# Patient Record
Sex: Male | Born: 1950 | Race: White | Hispanic: No | Marital: Married | Smoking: Current some day smoker
Health system: Southern US, Community
[De-identification: ages and names within clinical notes are randomized; demographics above are authoritative.]

## PROBLEM LIST (undated history)

## (undated) DIAGNOSIS — D62 Acute posthemorrhagic anemia: Secondary | ICD-10-CM

## (undated) DIAGNOSIS — K219 Gastro-esophageal reflux disease without esophagitis: Secondary | ICD-10-CM

## (undated) DIAGNOSIS — T8189XA Other complications of procedures, not elsewhere classified, initial encounter: Secondary | ICD-10-CM

## (undated) DIAGNOSIS — G4730 Sleep apnea, unspecified: Secondary | ICD-10-CM

## (undated) DIAGNOSIS — F329 Major depressive disorder, single episode, unspecified: Secondary | ICD-10-CM

## (undated) DIAGNOSIS — I10 Essential (primary) hypertension: Secondary | ICD-10-CM

## (undated) DIAGNOSIS — N390 Urinary tract infection, site not specified: Secondary | ICD-10-CM

## (undated) DIAGNOSIS — T1490XA Injury, unspecified, initial encounter: Secondary | ICD-10-CM

## (undated) DIAGNOSIS — K449 Diaphragmatic hernia without obstruction or gangrene: Secondary | ICD-10-CM

## (undated) DIAGNOSIS — B9689 Other specified bacterial agents as the cause of diseases classified elsewhere: Secondary | ICD-10-CM

## (undated) DIAGNOSIS — F3289 Other specified depressive episodes: Secondary | ICD-10-CM

## (undated) DIAGNOSIS — C44310 Basal cell carcinoma of skin of unspecified parts of face: Secondary | ICD-10-CM

## (undated) DIAGNOSIS — K222 Esophageal obstruction: Secondary | ICD-10-CM

## (undated) DIAGNOSIS — IMO0002 Reserved for concepts with insufficient information to code with codable children: Secondary | ICD-10-CM

## (undated) HISTORY — DX: Other complications of procedures, not elsewhere classified, initial encounter: T81.89XA

## (undated) HISTORY — PX: WRIST FRACTURE SURGERY: SHX121

## (undated) HISTORY — DX: Reserved for concepts with insufficient information to code with codable children: IMO0002

## (undated) HISTORY — DX: Diaphragmatic hernia without obstruction or gangrene: K44.9

## (undated) HISTORY — PX: ADENOIDECTOMY: SUR15

## (undated) HISTORY — PX: SPINE SURGERY: SHX786

## (undated) HISTORY — PX: ANKLE FRACTURE SURGERY: SHX122

## (undated) HISTORY — PX: FEMUR FRACTURE SURGERY: SHX633

## (undated) HISTORY — PX: ABDOMINAL SURGERY: SHX537

## (undated) HISTORY — DX: Acute posthemorrhagic anemia: D62

## (undated) HISTORY — PX: PELVIC FRACTURE SURGERY: SHX119

## (undated) HISTORY — PX: JOINT REPLACEMENT: SHX530

## (undated) HISTORY — PX: TONSILLECTOMY: SUR1361

## (undated) HISTORY — DX: Sleep apnea, unspecified: G47.30

## (undated) HISTORY — DX: Other specified bacterial agents as the cause of diseases classified elsewhere: B96.89

## (undated) HISTORY — DX: Gastro-esophageal reflux disease without esophagitis: K21.9

## (undated) HISTORY — DX: Esophageal obstruction: K22.2

## (undated) HISTORY — DX: Urinary tract infection, site not specified: N39.0

## (undated) HISTORY — DX: Other specified depressive episodes: F32.89

## (undated) HISTORY — DX: Major depressive disorder, single episode, unspecified: F32.9

## (undated) HISTORY — DX: Essential (primary) hypertension: I10

## (undated) HISTORY — DX: Basal cell carcinoma of skin of unspecified parts of face: C44.310

---
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
## 1987-02-07 HISTORY — PX: NASAL SEPTUM SURGERY: SHX37

## 1992-11-06 HISTORY — PX: APPENDECTOMY: SHX54

## 1998-05-08 HISTORY — PX: LUMBAR FUSION: SHX111

## 1998-09-13 ENCOUNTER — Encounter: Admission: RE | Admit: 1998-09-13 | Discharge: 1998-12-12 | Payer: Self-pay | Admitting: *Deleted

## 1999-01-07 HISTORY — PX: OTHER SURGICAL HISTORY: SHX169

## 1999-01-21 ENCOUNTER — Encounter: Admission: RE | Admit: 1999-01-21 | Discharge: 1999-04-21 | Payer: Self-pay | Admitting: *Deleted

## 2000-02-07 HISTORY — PX: VENTRAL HERNIA REPAIR: SHX424

## 2000-03-26 ENCOUNTER — Encounter: Payer: Self-pay | Admitting: Surgery

## 2000-03-28 ENCOUNTER — Ambulatory Visit (HOSPITAL_COMMUNITY): Admission: RE | Admit: 2000-03-28 | Discharge: 2000-03-29 | Payer: Self-pay | Admitting: Surgery

## 2002-09-07 HISTORY — PX: OTHER SURGICAL HISTORY: SHX169

## 2002-11-11 ENCOUNTER — Encounter: Admission: RE | Admit: 2002-11-11 | Discharge: 2002-12-23 | Payer: Self-pay | Admitting: *Deleted

## 2003-05-08 LAB — HM COLONOSCOPY

## 2003-05-28 ENCOUNTER — Ambulatory Visit (HOSPITAL_COMMUNITY): Admission: RE | Admit: 2003-05-28 | Discharge: 2003-05-28 | Payer: Self-pay | Admitting: Gastroenterology

## 2010-02-22 LAB — POC HEMOCCULT BLD/STL (HOME/3-CARD/SCREEN)

## 2010-04-30 ENCOUNTER — Encounter: Payer: Self-pay | Admitting: Family Medicine

## 2010-04-30 DIAGNOSIS — F329 Major depressive disorder, single episode, unspecified: Secondary | ICD-10-CM

## 2010-04-30 DIAGNOSIS — K222 Esophageal obstruction: Secondary | ICD-10-CM

## 2010-04-30 DIAGNOSIS — K219 Gastro-esophageal reflux disease without esophagitis: Secondary | ICD-10-CM

## 2010-04-30 DIAGNOSIS — I10 Essential (primary) hypertension: Secondary | ICD-10-CM

## 2010-04-30 DIAGNOSIS — K449 Diaphragmatic hernia without obstruction or gangrene: Secondary | ICD-10-CM

## 2010-04-30 DIAGNOSIS — F339 Major depressive disorder, recurrent, unspecified: Secondary | ICD-10-CM | POA: Insufficient documentation

## 2010-04-30 DIAGNOSIS — G4730 Sleep apnea, unspecified: Secondary | ICD-10-CM

## 2010-04-30 DIAGNOSIS — C44310 Basal cell carcinoma of skin of unspecified parts of face: Secondary | ICD-10-CM | POA: Insufficient documentation

## 2010-04-30 DIAGNOSIS — IMO0002 Reserved for concepts with insufficient information to code with codable children: Secondary | ICD-10-CM

## 2010-07-23 ENCOUNTER — Inpatient Hospital Stay (HOSPITAL_COMMUNITY)
Admission: EM | Admit: 2010-07-23 | Discharge: 2010-07-26 | DRG: 857 | Disposition: A | Discharge status: Home Health | Payer: Managed Care, Other (non HMO) | Source: Ambulatory Visit | Attending: Orthopedic Surgery | Admitting: Orthopedic Surgery

## 2010-07-23 DIAGNOSIS — Y838 Other surgical procedures as the cause of abnormal reaction of the patient, or of later complication, without mention of misadventure at the time of the procedure: Secondary | ICD-10-CM | POA: Diagnosis present

## 2010-07-23 DIAGNOSIS — M1990 Unspecified osteoarthritis, unspecified site: Secondary | ICD-10-CM | POA: Diagnosis present

## 2010-07-23 DIAGNOSIS — Y92009 Unspecified place in unspecified non-institutional (private) residence as the place of occurrence of the external cause: Secondary | ICD-10-CM

## 2010-07-23 DIAGNOSIS — M009 Pyogenic arthritis, unspecified: Secondary | ICD-10-CM | POA: Diagnosis present

## 2010-07-23 DIAGNOSIS — Z96649 Presence of unspecified artificial hip joint: Secondary | ICD-10-CM

## 2010-07-23 DIAGNOSIS — G4733 Obstructive sleep apnea (adult) (pediatric): Secondary | ICD-10-CM | POA: Diagnosis present

## 2010-07-23 DIAGNOSIS — T8140XA Infection following a procedure, unspecified, initial encounter: Principal | ICD-10-CM | POA: Diagnosis present

## 2010-07-23 DIAGNOSIS — I10 Essential (primary) hypertension: Secondary | ICD-10-CM | POA: Diagnosis present

## 2010-07-23 LAB — CBC
HCT: 38.9 % — ABNORMAL LOW (ref 39.0–52.0)
Hemoglobin: 13.6 g/dL (ref 13.0–17.0)
MCH: 30.9 pg (ref 26.0–34.0)
MCHC: 35 g/dL (ref 30.0–36.0)

## 2010-07-23 LAB — BASIC METABOLIC PANEL
BUN: 11 mg/dL (ref 6–23)
Calcium: 8.7 mg/dL (ref 8.4–10.5)
GFR calc non Af Amer: >60 mL/min (ref >60)
Glucose, Bld: 136 mg/dL — ABNORMAL HIGH (ref 70–99)
Sodium: 137 mEq/L (ref 135–145)

## 2010-07-23 LAB — SYNOVIAL CELL COUNT + DIFF, W/ CRYSTALS
Lymphocytes-Synovial Fld: 4 % (ref 0–20)
Monocyte-Macrophage-Synovial Fluid: 12 % — ABNORMAL LOW (ref 50–90)
Neutrophil, Synovial: 84 % — ABNORMAL HIGH (ref 0–25)

## 2010-07-24 DIAGNOSIS — M009 Pyogenic arthritis, unspecified: Secondary | ICD-10-CM

## 2010-07-24 LAB — CBC
MCH: 30.4 pg (ref 26.0–34.0)
MCV: 89.2 fL (ref 78.0–100.0)
Platelets: 186 10*3/uL (ref 150–400)
RBC: 4.08 MIL/uL — ABNORMAL LOW (ref 4.22–5.81)
RDW: 12.4 % (ref 11.5–15.5)
WBC: 7.4 10*3/uL (ref 4.0–10.5)

## 2010-07-24 LAB — BASIC METABOLIC PANEL
Calcium: 8.2 mg/dL — ABNORMAL LOW (ref 8.4–10.5)
Chloride: 102 mEq/L (ref 96–112)
Creatinine, Ser: 0.48 mg/dL — ABNORMAL LOW (ref 0.50–1.35)
GFR calc Af Amer: >60 mL/min (ref >60)
Sodium: 138 mEq/L (ref 135–145)

## 2010-07-24 LAB — SEDIMENTATION RATE: Sed Rate: 36 mm/hr — ABNORMAL HIGH (ref 0–16)

## 2010-07-25 DIAGNOSIS — M009 Pyogenic arthritis, unspecified: Secondary | ICD-10-CM

## 2010-07-25 LAB — HIV ANTIBODY (ROUTINE TESTING W REFLEX): HIV: NONREACTIVE

## 2010-07-29 LAB — CULTURE, BLOOD (ROUTINE X 2): Culture  Setup Time: 201206161705

## 2010-08-03 ENCOUNTER — Other Ambulatory Visit: Payer: Self-pay | Admitting: Orthopedic Surgery

## 2010-08-03 DIAGNOSIS — M25511 Pain in right shoulder: Secondary | ICD-10-CM

## 2010-08-04 ENCOUNTER — Ambulatory Visit
Admission: RE | Admit: 2010-08-04 | Discharge: 2010-08-04 | Disposition: A | Discharge status: Home / Self Care | Payer: Managed Care, Other (non HMO) | Source: Ambulatory Visit | Attending: Orthopedic Surgery | Admitting: Orthopedic Surgery

## 2010-08-04 ENCOUNTER — Inpatient Hospital Stay (HOSPITAL_COMMUNITY)
Admission: RE | Admit: 2010-08-04 | Discharge: 2010-08-08 | DRG: 857 | Disposition: A | Discharge status: Home / Self Care | Payer: Managed Care, Other (non HMO) | Source: Ambulatory Visit | Attending: Orthopedic Surgery | Admitting: Orthopedic Surgery

## 2010-08-04 DIAGNOSIS — Y838 Other surgical procedures as the cause of abnormal reaction of the patient, or of later complication, without mention of misadventure at the time of the procedure: Secondary | ICD-10-CM | POA: Diagnosis present

## 2010-08-04 DIAGNOSIS — G4733 Obstructive sleep apnea (adult) (pediatric): Secondary | ICD-10-CM | POA: Diagnosis present

## 2010-08-04 DIAGNOSIS — M009 Pyogenic arthritis, unspecified: Secondary | ICD-10-CM | POA: Diagnosis present

## 2010-08-04 DIAGNOSIS — R7309 Other abnormal glucose: Secondary | ICD-10-CM | POA: Diagnosis present

## 2010-08-04 DIAGNOSIS — M25511 Pain in right shoulder: Secondary | ICD-10-CM

## 2010-08-04 DIAGNOSIS — E785 Hyperlipidemia, unspecified: Secondary | ICD-10-CM | POA: Diagnosis present

## 2010-08-04 DIAGNOSIS — I10 Essential (primary) hypertension: Secondary | ICD-10-CM | POA: Diagnosis present

## 2010-08-04 DIAGNOSIS — T8140XA Infection following a procedure, unspecified, initial encounter: Principal | ICD-10-CM | POA: Diagnosis present

## 2010-08-04 LAB — GRAM STAIN

## 2010-08-04 LAB — BASIC METABOLIC PANEL
BUN: 13 mg/dL (ref 6–23)
GFR calc Af Amer: >60 mL/min (ref >60)
GFR calc non Af Amer: >60 mL/min (ref >60)
Potassium: 4.5 mEq/L (ref 3.5–5.1)
Sodium: 139 mEq/L (ref 135–145)

## 2010-08-04 LAB — CBC
MCHC: 35.1 g/dL (ref 30.0–36.0)
Platelets: 248 10*3/uL (ref 150–400)
RDW: 11.9 % (ref 11.5–15.5)

## 2010-08-05 DIAGNOSIS — M009 Pyogenic arthritis, unspecified: Secondary | ICD-10-CM

## 2010-08-05 LAB — GLUCOSE, CAPILLARY
Glucose-Capillary: 182 mg/dL — ABNORMAL HIGH (ref 70–99)
Glucose-Capillary: 137 mg/dL — ABNORMAL HIGH (ref 70–99)
Glucose-Capillary: 192 mg/dL — ABNORMAL HIGH (ref 70–99)

## 2010-08-05 LAB — BASIC METABOLIC PANEL
Calcium: 8.1 mg/dL — ABNORMAL LOW (ref 8.4–10.5)
Chloride: 103 mEq/L (ref 96–112)
Creatinine, Ser: 0.6 mg/dL (ref 0.50–1.35)
GFR calc Af Amer: >60 mL/min (ref >60)

## 2010-08-05 LAB — CBC
MCH: 29.9 pg (ref 26.0–34.0)
MCV: 88.2 fL (ref 78.0–100.0)
Platelets: 246 10*3/uL (ref 150–400)
RDW: 12.2 % (ref 11.5–15.5)
WBC: 9.7 10*3/uL (ref 4.0–10.5)

## 2010-08-06 LAB — BASIC METABOLIC PANEL
CO2: 31 mEq/L (ref 19–32)
Chloride: 99 mEq/L (ref 96–112)
Creatinine, Ser: 0.53 mg/dL (ref 0.50–1.35)

## 2010-08-06 LAB — CBC
Hemoglobin: 12.3 g/dL — ABNORMAL LOW (ref 13.0–17.0)
MCV: 87.6 fL (ref 78.0–100.0)
Platelets: 247 10*3/uL (ref 150–400)
RBC: 4.19 MIL/uL — ABNORMAL LOW (ref 4.22–5.81)
WBC: 8.2 10*3/uL (ref 4.0–10.5)

## 2010-08-06 LAB — GLUCOSE, CAPILLARY
Glucose-Capillary: 116 mg/dL — ABNORMAL HIGH (ref 70–99)
Glucose-Capillary: 154 mg/dL — ABNORMAL HIGH (ref 70–99)
Glucose-Capillary: 145 mg/dL — ABNORMAL HIGH (ref 70–99)

## 2010-08-06 LAB — BODY FLUID CULTURE: Culture: NO GROWTH

## 2010-08-06 LAB — ANAEROBIC CULTURE

## 2010-08-07 LAB — GLUCOSE, CAPILLARY: Glucose-Capillary: 135 mg/dL — ABNORMAL HIGH (ref 70–99)

## 2010-08-08 LAB — BODY FLUID CULTURE

## 2010-08-08 LAB — GLUCOSE, CAPILLARY: Glucose-Capillary: 130 mg/dL — ABNORMAL HIGH (ref 70–99)

## 2010-08-09 ENCOUNTER — Telehealth: Payer: Self-pay | Admitting: Licensed Clinical Social Worker

## 2010-08-09 ENCOUNTER — Ambulatory Visit (HOSPITAL_COMMUNITY)
Admission: RE | Admit: 2010-08-09 | Payer: Managed Care, Other (non HMO) | Source: Ambulatory Visit | Admitting: Orthopedic Surgery

## 2010-08-09 LAB — ANAEROBIC CULTURE

## 2010-08-09 NOTE — Telephone Encounter (Signed)
Patient is on primaxin every 6 hours and states it interferes with work. Patient wants to take every 8 hours is that ok? Spoke with [facility] pharmacy the pharmacy states it is usually every 8

## 2010-08-09 NOTE — Telephone Encounter (Signed)
Patient was switched to every 8 hours per Dr. Luciana Axe and Dr. Rubye Oaks this as well. Wendall Mola CMA

## 2010-08-18 NOTE — Consult Note (Signed)
NAME:  Melvin [name], Melvin [name] NO.:  [id number]  MEDICAL RECORD NO.:  [id number]  LOCATION:  5010                         FACILITY:  [facility]  PHYSICIAN:  Brendia Sacks, MD    DATE OF BIRTH:  11/03/1950  DATE OF CONSULTATION:  08/05/2010 DATE OF DISCHARGE:                                CONSULTATION   PRIMARY CARE PHYSICIAN:  Ernestina Penna, MD., [facility].  REFERRING PHYSICIAN:  Dr. Jones Broom.  REASON FOR CONSULTATION:  Hyperglycemia.  HISTORY OF PRESENT ILLNESS:  This is a 60 year old man who was admitted by Dr. Ave Filter, yesterday for postoperative shoulder infection for surgery.  He has had hyperglycemia and I have been asked to evaluate him.  The patient reports no previous history of diabetes but however, approximately 5-6 years ago he had elevated blood sugars.  His primary care physician had him follows blood sugars for a period of approximately a month or so and his blood sugars resolved to normal. The patient did lose a significant amount of weight i the past. However, lately he has gained weight and he recently just had checkup approximately 3 weeks ago with his primary care physician.  At that time, his hemoglobin A1c was 6.5.  He was scheduled to see a dietitian.  He notes his hypertension has been well controlled and his hyperlipidemia has been well controlled.  He has obstructive sleep apnea for which he uses CPAP.  REVIEW OF SYSTEMS:  Negative for fever, changes to his vision, sore throat, or rash.  Positive for shoulder pain.  Negative chest pain, shortness breath, nausea, vomiting, pain, diarrhea, dysuria, or bleeding.  PAST MEDICAL HISTORY: 1. Hypertension. 2. Acromioclavicular osteomyelitis. 3. Obstructive sleep apnea. 4. Hyperlipidemia. 5. Osteoarthritis.  PAST SURGICAL HISTORY: 1. Back surgery. 2. Ventral hernia repair. 3. Bilateral hip replacement. 4. Shoulder surgery.  FAMILY HISTORY:  Negative  for first-degree coronary artery disease and diabetes mellitus.  SOCIAL HISTORY:  Nonsmoker, occasional alcohol.  MAINTENANCE MEDICATIONS: 1. Zetia 10 mg p.o. daily. 2. Vitamin D3 p.o. daily. 3. Vitamin C p.o. daily. 4. Currently on ceftriaxone 1 g b.i.d. 5. Vancomycin 1750 mg b.i.d. IV. 6. Norvasc 5 mg p.o. daily. 7. Multivitamin p.o. daily. 8. Lovaza 2 capsules p.o. daily, 1 g each. 9. Ibuprofen 800 mg t.i.d. as needed for pain. 10.Fluoxetine 20 mg 2 capsules p.o. daily. 11.Flonase once for each nostril daily. 12.Finasteride 1 mg p.o. daily.  INPATIENT MEDICATIONS:  Norvasc, Rocephin, Zetia, Prozac, Flonase, sliding scale insulin, multivitamin, and vancomycin.  PHYSICAL EXAMINATION:  GENERAL:  A well-developed, well-nourished man in no acute distress.  He is sitting in his chair. VITAL SIGNS:  Temperature is 99.2, pulse 78, respirations 18, blood pressure 163/72, and sat 98% on CPAP early this morning. HEENT:  Head appears to be normal.  EYES:  Sclerae clear.  Pupils equal, round, react to light and irises appear unremarkable.  ENT:  Dentition fair.  Lips and tongue appear unremarkable. NECK:  Supple.  No lymphadenopathy or masses.  No thyromegaly. CHEST:  Clear to auscultation bilaterally.  No wheezes, rales, or rhonchi.  There is normal respiratory effort.  No lower extremity edema. CARDIOVASCULAR:  Regular rate  and rhythm.  No murmur, rub, or gallop. No lower extremity edema. ABDOMEN:  Soft, nontender, nondistended.  Speech is fluent and clear.  PERTINENT LABORATORY STUDIES:  Capillary blood sugars have ranged from the 130-197.  Fasting blood sugar this morning was 182.  BUN and creatinine well within normal limits.  CBC revealed a stable hemoglobin 11.7.  IMPRESSION AND RECOMMENDATIONS:  This is a 60 year old man with hyperglycemia. 1. Hyperglycemia.  Borderline diabetes mellitus.  His last hemoglobin     A1c was 6.5 approximately 3 weeks ago.  I suspect his blood  sugars     have been elevated by current infection.  However, he has also had     weight gain and his diet has been suboptimal.  He follows quite     closely with Dr. Rudi Heap in place to follow up with him seen.     He already has been referred to see a dietitian by Dr. Christell Constant.  The     patient is willing to check his blood sugars again and would like     to get a glucometer.  Dr. Christell Constant which I think is reasonable.  He     has required moderate amount insulin here and I think he would be     stable for discharge when he is ready for an orthopedic standpoint.     Metformin could be considered.  However, given the fact that the     patient is on vancomycin at this point I would recommend low-dose     glyburide 1.25 mg p.o. daily.  This should be taken in the morning     and the patient should follow his blood sugars closely.  I would     recommend starting with a low dose in this patient who have blood     sugars less than 200, although it does appear that his fasting     blood sugars range from 130s to 180s.  His fasting blood sugar this     morning was 168-182.  Risk of hypoglycemia would be low on the low     dose given his blood sugar elevations.  Ultimately, the patient     loses weight and modified his diet.  He may in time be able to come     off oral agents altogether.  I attempted to reach Dr. Christell Constant, today     but he is out of the office.  I have discussed my recommendations     with Dr. Ave Filter by phone.  I think at this point the patient     will be discharged when stable for an orthopedic standpoint. 2. Hypertension appears to be stable.  Continue Norvasc. 3. Hyperlipidemia.  Continue the patient's Lovaza on discharge.     Brendia Sacks, MD     DG/MEDQ  D:  08/05/2010  T:  08/05/2010  Job:  [id number]  cc:   Ernestina Penna, M.D.  Electronically Signed by Brendia Sacks  on 08/18/2010 11:30:17 AM

## 2010-08-22 LAB — FUNGUS CULTURE W SMEAR

## 2010-08-26 ENCOUNTER — Encounter: Payer: Self-pay | Admitting: Infectious Diseases

## 2010-08-30 NOTE — Discharge Summary (Signed)
  [name], [name] NO.:  [id number]  MEDICAL RECORD NO.:  [id number]  LOCATION:  5011                         FACILITY:  [facility]  PHYSICIAN:  Jones Broom, MD    DATE OF BIRTH:  [birth year]  DATE OF ADMISSION:  07/23/2010 DATE OF DISCHARGE:  07/26/2010                              DISCHARGE SUMMARY   FINAL DIAGNOSIS:  Postoperative infection, right acromioclavicular joint.  PRINCIPAL PROCEDURE:  Irrigation and debridement, right AC joint, July 23, 2010  CONSULTING PHYSICIAN:  Dr. Johny Sax with Infectious Disease.  HOSPITAL COURSE:  Ed was admitted July 23, 2010 after undergoing irrigation and debridement of his right [facility] joint.  He had a postoperative infection after open distal clavicle excision.  He also had blood cultures on the day of his admission.  All the operative cultures and blood cultures remained without growth at the time of his discharge.  He remained afebrile with stable vital signs throughout his hospital course.  Pain was well-controlled postoperatively initially with IV medications and subsequently with oral medications.  He had 2 large bore drains placed at time of surgery and they were followed daily.  On postoperative day #3, drainage had tapered off to minimal to no drainage and they were pulled.  His dressing was changed and the incision site was clean, dry, and intact with no reaccumulation of fluid.  He was eating and drinking well.  He was voiding spontaneously. Dr. Daiva Eves and Dr. Ninetta Lights with Infectious Disease consulted on the patient and made recommendations for empiric treatment based on the fact that cultures have been negative to date.  They recommended a 6-week course of IV vancomycin and ceftriaxone.  The patient did receive a PICC line July 25, 2010.  On the day of discharge, he was stable with stable vital signs.  Pain was well controlled.  He was requiring minimal pain medication.  DISCHARGE INSTRUCTIONS:   He will be discharged home with his wife.  He will need daily dressing changes.  He will have Norco tablets for pain control.  He will have home health IV antibiotics with his PICC line. His outpatient labs to monitor the antibiotics will be followed by Dr. Johny Sax in his office.  He will follow up with me in 5-7 days for wound recheck or sooner with problems or concerns.     Jones Broom, MD     JC/MEDQ  D:  07/26/2010  T:  07/26/2010  Job:  [id number]  Electronically Signed by Jones Broom  on 08/30/2010 03:56:00 PM

## 2010-08-30 NOTE — Op Note (Signed)
Melvin [name], Melvin [name] NO.:  [id number]  MEDICAL RECORD NO.:  [id number]  LOCATION:  5011                         FACILITY:  [facility]  PHYSICIAN:  Jones Broom, MD    DATE OF BIRTH:  [birth year]  DATE OF PROCEDURE:  07/23/2010 DATE OF DISCHARGE:                              OPERATIVE REPORT   PREOPERATIVE DIAGNOSIS:  Right shoulder presumed postoperative infection.  POSTOPERATIVE DIAGNOSIS:  Right shoulder infection involving the acromioclavicular joint and extending into the subacromial space.  PROCEDURE PERFORMED:  Irrigation and debridement right shoulder.  ATTENDING SURGEON:  Jones Broom, MD  ASSISTANT:  None.  ANESTHESIA:  GETA.  COMPLICATIONS:  None.  DRAINS:  Two large JP drains were placed with one in the subacromial space and one in the [facility] joint.  ESTIMATED BLOOD LOSS:  Minimal.  INDICATIONS FOR SURGERY:  The patient is a 60 year old gentleman who had an arthroscopic shoulder surgery approximately 2 months ago.  Given the extent of his [facility] joint disease superiorly, he had an open distal clavicle excision.  He did very well postoperatively and was back to all his normal activities.  Approximately 1 month after the surgery, he had a sudden onset of large swelling in the [facility] joint and subcutaneous tissues.  He is not having fevers, chills or signs of infection and therefore the area was aspirated.  The fluid was sent for culture, Gram stain and cell count and was not felt to be initially consistent with infection.  He had a pressure dressing and the fluid subsequently recurred.  After multiple aspirations with recurrence, all of his aspirates did not grow any bacteria but the suspicion for infection at this point was high enough that I recommended open debridement and antibiotics.  Within the last week, he has started to feel systemically more ill but did not have fevers or chills.  His white blood cell count was 8.  OPERATIVE FINDINGS:   There was significant fluid with gross purulence in the [facility] joint and subcutaneous space.  This did extend into the subacromial and subdeltoid space.  I copiously irrigated this and debrided all unhealthy and nonviable appearing tissue aggressively. Normal saline 6 L were used with pulse lavage and two large JP drains were placed one in subacromial space and one in [facility] joint.  PROCEDURE:  The patient was identified in the preoperative holding area where I personally marked the operative site after verifying site, side and procedure with the patient.  He was taken back to the operating room where general anesthesia was induced without complication.  The right upper extremity was prepped and draped in standard sterile fashion.  The patient was in a modified beach-chair position with the back slightly elevated.  The appropriate time-out procedure was carried out. Antibiotics were held until cultures were taken.  Wound was opened and copious amounts of cloudy yellow fluid with purulence was drained.  This was all captured and sent for microbiology analysis.  Specific instructions were sent to hold the cultures for 14 days specifically to look for P. acnes.  I then extended the incision anterior and posterior and extensively aggressively debrided the area of any non healthy- appearing tissue.  I took a layer of subcutaneous tissue around the area of the incision and used a rongeur to debride all of phlegmon material. The AC joint capsule was opened and extensively involved.  Most of it was resected.  I set my finger under the acromion and subacromial space and some yellow fluid came forth.  I was able to run my finger all the way around in the subacromial space and completely decompress this area. I then used pulse lavage to copiously irrigate the [facility] joint in a subacromial space with 6 liters normal saline.  At the conclusion, I felt there was no further pockets remaining of collection and the  wound did appear fairly clean.  I then placed two large JP drains out anteriorly subcutaneously placing one in the subacromial space and one in the [facility] joint space.  I then sow these in place with 2-0 nylon and closed the wound with 2-0 nylon in an interrupted simple fashion. Sterile dressing was then applied including Adaptic, 4x4s, ABDs and tape.  The patient was then allowed to awaken from general anesthesia, transferred to stretcher and taken to the recovery room in stable condition.  POSTOPERATIVE PLAN:  I will consult Infectious Disease Team for recommendations regarding antibiotic treatment.  For now, I will start him on vancomycin to cover the most common and presumed pathogen.  He will be a sling for comfort.  Drains will remain in for several days.     Jones Broom, MD     JC/MEDQ  D:  07/23/2010  T:  07/24/2010  Job:  [id number]  Electronically Signed by Jones Broom  on 08/30/2010 03:55:56 PM

## 2010-08-30 NOTE — Discharge Summary (Signed)
  [name], [name] NO.:  [id number]  MEDICAL RECORD NO.:  [id number]  LOCATION:  5010                         FACILITY:  [facility]  PHYSICIAN:  Melvin Broom, MD    DATE OF BIRTH:  1950/08/17  DATE OF ADMISSION:  08/04/2010 DATE OF DISCHARGE:  08/08/2010                              DISCHARGE SUMMARY   FINAL DIAGNOSES: 1. Hyperglycemia. 2. Hypertension. 3. Postoperative infection, right shoulder.  PRINCIPAL PROCEDURE:  Irrigation and debridement, arthroscopic and open right shoulder infection, August 04, 2010  CONSULTING PHYSICIAN:  Melvin Sacks, MD  HOSPITAL COURSE:  Melvin [name] is a 60 year old gentleman who was admitted for postoperative shoulder infection with recurrent swelling after previous I and D.  He was taken back to the operating room on August 04, 2010 where he had open and arthroscopic irrigation and debridement of the shoulder.  Multiple cultures were sent again.  He had 3 drains placed.  Postoperatively, he recovered well on the floor.  The drains were kept in for several days to rule out continued drainage.  The Infectious Disease Consult Team revisited the patient and recommended changing his ceftriaxone to Primaxin and continuing the vancomycin. This was done during his hospital stay.  He remains stable with stable vital signs and afebrile throughout his hospital course.  All of these cultures remained without growth.  He was noted to have elevated blood glucose and elevated blood pressures throughout the hospital stay and therefore the Hospitalist Team saw and evaluated him.  They changed his medications and at the time of discharge both were under good control. On August 08, 2010, his drains were all discontinued after they were no longer draining anything and the incision was clean, dry, and intact. He was discharged home in stable condition with his family.  DISCHARGE INSTRUCTIONS:  He will do daily dressing changes until the drain  sites are dry.  He will take his antibiotics as directed with vancomycin and Primaxin.  He will have weekly labs.  He will follow up with me on Friday for wound check.  He will call or return sooner with any problems or concerns.  MEDICATIONS:  His new discharge medications in addition to his home medications include: 1. Glyburide 1.25 mg daily with a meal. 2. Lisinopril 10 mg by mouth daily. 3. Primaxin 500 mg every 6 hours. 4. Vancomycin 2 grams twice daily.     Melvin Broom, MD     JC/MEDQ  D:  08/08/2010  T:  08/08/2010  Job:  [id number]  Electronically Signed by Melvin [name]  on 08/30/2010 03:56:03 PM

## 2010-08-30 NOTE — Op Note (Signed)
NAMESAIFAN, RAYFORD NO.:  [id number]  MEDICAL RECORD NO.:  [id number]  LOCATION:  5010                         FACILITY:  [facility]  PHYSICIAN:  Jones Broom, MD    DATE OF BIRTH:  1951/01/02  DATE OF PROCEDURE:  08/04/2010 DATE OF DISCHARGE:                              OPERATIVE REPORT   PREOPERATIVE DIAGNOSIS:  Right shoulder postoperative infection.  POSTOPERATIVE DIAGNOSIS:  Right shoulder postoperative infection involving the acromioclavicular joint, subdeltoid/subacromial space, and glenohumeral joint.  PROCEDURE PERFORMED:  Arthroscopic and open irrigation and debridement of right shoulder including acromioclavicular joint, glenohumeral joint, and subdeltoid/subacromial space.  ATTENDING SURGEON:  Jones Broom, MD  ASSISTANT:  None.  ANESTHESIA:  GETA.  COMPLICATIONS:  None.  DRAINS:  Three large Hemovac drains were placed with one in the posterior subdeltoid space, one in the [facility] joint, and one in the glenohumeral joint.  ESTIMATED BLOOD LOSS:  Minimal.  INDICATIONS FOR SURGERY:  The patient is a 60 year old gentleman who had an arthroscopic surgery with open distal clavicle excision and initially did very well.  He subsequently had concern for infection at the [facility] joint.  I took him to the operating room 12 days ago where I performed open irrigation and debridement of the [facility] joint which looked concerning for infection, but however, no cultures have ever grown anything from that procedure or 2 prior aspirations.  He has been on vancomycin and ceftriaxone since that procedure, but he has had recurrence of swelling, erythema, and pain in the shoulder.  MRI revealed involvement of the subdeltoid space extending posteriorly along the posterior aspect of the infraspinatus and down nearly the deltoid insertion.  The [facility] joint was involved and I was concerned for involvement of the glenohumeral joint as well.  He is indicated for repeat I and D  with arthroscopic exam of all the affected areas and possible open treatment as well.  He understood risks, benefits, and alternatives to surgery and wished to go ahead with it.  OPERATIVE FINDINGS:  Diagnostic arthroscopy revealed some cloudy yellow fluid in the joint.  There was small fibrinous material in the joint, but the joint surfaces appeared intact without any chondral damage. There was a small defect in the anterior cartilage of the humeral head, which was debrided, but other than that cartilages were very healthy. The rotator cuff was intact.  The biceps tendon was intact.  No large loose bodies were noted.  Four liters of normal saline were run through the glenohumeral joint.  The subacromial space was noted to have fluid, but no gross purulence or fibrinous flexion.  Arthroscopically, I was able to get back to the area of concern adjacent to the infraspinatus and debrided some fibrinous material.  I was also able to get down to the lateral subdeltoid recess and debride this.  The [facility] joint was extensively debrided from the undersurface arthroscopically.  I placed drains in the [facility] joint and the posterior subdeltoid recess and the glenohumeral joint.  At the conclusion of the procedure arthroscopically, I felt that everything had been adequately addressed,but as a precaution I did open the superior incision again for about 3 cm to make sure  there were no cutaneous pockets that would have been missed arthroscopically.  This looked clean.  PROCEDURE:  The patient was identified in the preoperative holding area where I personally marked the operative site after verifying site, side, and procedure with the patient.  He was taken back to the operating room where general anesthesia was induced without complication.  He was seated in the beach-chair position.  All extremities were carefully padded in position.  Antibiotics were held.  The appropriate time-out procedure was carried  out and the right upper extremity was then prepped and draped in a standard sterile fashion.  Standard posterior portal was established and the arthroscope was introduced into the joint.  Prior to introducing the arthroscope, I did collect fluid from the joint, which was cloudy yellow in appearance and was sent for culture, Gram stain. An anterior portal was established and a shaver was introduced to the anterior portal.  There was noted to be some fibrinous material floating in the joint.  This was debrided.  All fibrinous and synovial hypertrophy was debrided.  The joint surfaces were carefully examined and probed.  There was no significant softening or damage to the articular cartilage that could be appreciated.  The rotator cuff was intact with mild undersurface fraying which was debrided.  Collene Mares was run in the joint extensively to run fluid through and agitate the tissues.  A total of about 4 liters normal saline was run through the glenohumeral joint.  The arthroscope was then introduced into the subacromial space.  Lateral portal was established with needle localization.  Collene Mares was used in the subacromial space.  There was not noted to be significant adhesions or fibrinous material.  There was some fluid, but no gross purulence was noted in the subacromial space.  The undersurface of the acromion was examined and debrided of fibrous scar tissue.  The arthroscope was used to carefully examine the posterior aspect of the rotator cuff and then followed over medially to get into the area of the infraspinatus muscle belly, which was carefully examined as this was an area of concern on MRI.  I got to the scapular spine and then progressed inferiorly in the subdeltoid space.  There was small amount of fibrinous material here which was debrided with a shaver.  I spent a lot of time back in this area running the shaver and running fluid through this area to make sure that this was  completely cleaned out.  I then looked in the lateral subdeltoid space where I followed the lateral humerus down all the way to what was felt to be near the deltoid insertion.  I ran the shaver in this area to ensure significant fluid flow and debride any fibrinous-appearing material in this region.  Care was taken not to get into the deltoid fibers.  Attention was then turned to the [facility] joint.  I used the shaver from the lateral portal and then moved the anterior portal into the [facility] joint and ran the shaver air to debride all fibrinous and unhealthy-appearing tissue in the region of the previous [facility] resection.  I identified both the medial aspect of the acromion and the lateral aspect of the distal clavicle and cleaned these with shaver and ArthroCare.  I was able to press down on the superior aspect of the incision to ensure that I was in the correct area and that I was pushing all material that needed to be debrided into the area where the shaver could get it.  I felt very confident that I was in the subcutaneous tissues in this region.  I spent a long time cleaning out this area.  I then took two large Hemovac drains and using a tonsil advanced them into the subacromial space through lateral portal.  The one was directed posteriorly into the subdeltoid space and the second was grasped through the anterior portal with a grasper bringing it into the region of the [facility] joint.  These were sewn into place.  The arthroscope was then reintroduced into the glenohumeral joint and a third large Hemovac was advanced into the anterior portal and the glenohumeral joint under direct visualization.  This was also sewn into place.  Portals were closed with 3-0 nylon.  At the conclusion, I felt very confident that I had adequately debrided the region of the [facility] joint, but to ensure that there was no subcutaneous pockets in this area, I did reopen the previous superior incision and used a rongeur around the  subcutaneous fat to make sure that this was healthy and benign and it was.  This was copiously irrigated with bulb syringe and then closed using nylon in an interrupted fashion.  Sterile dressings were applied including Adaptic, 4 x 4's, ABD, and tape.  The patient was allowed to awaken from general anesthesia, transferred to stretcher, and taken to the recovery room in stable condition.  POSTOPERATIVE PLAN:  He will be admitted to the hospital and continued on his current regimen of vancomycin and ceftriaxone.  Infectious Disease folks will come and see him tomorrow and make a decision regarding any antibiotic change given his continued infection.  Keep the drain drains in for least 48 hours.  He will remain in a sling while the drains are in.     Jones Broom, MD     JC/MEDQ  D:  08/04/2010  T:  08/05/2010  Job:  [id number]  Electronically Signed by Jones Broom  on 08/30/2010 03:56:07 PM

## 2010-08-31 NOTE — Consult Note (Signed)
NAMEROLLY, MAGRI NO.:  [id number]  MEDICAL RECORD NO.:  [id number]  LOCATION:  5011                         FACILITY:  [facility]  PHYSICIAN:  Melvin Lav, MD  DATE OF BIRTH:  1950-09-23  DATE OF CONSULTATION:  07/24/2010 DATE OF DISCHARGE:                                CONSULTATION   REQUESTING PHYSICIAN:  Jones Broom, MD  REASON FOR INFECTIOUS DISEASE CONSULTATION:  Septic right acromioclavicular joint.  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  Since then he has had episodes of swelling at the operative site, this was a largely a subcutaneous growth.  This was aspirated in the office and cell count was not consistent with infection.  Cultures never yielded an organism and the patient has never treated with antibiotics.  The swelling would go down and reaccumulate.  This continued and then within the last 10 days he suddenly developed more swollen and he developed increase in pain in his forearm and shoulder. In addition to that he describes which he says are "flu-like symptoms with body aches and fevers and chills."  He therefore brought to the operating room yesterday by Dr. Ave Filter who performed irrigation and debridement of the right shoulder.  In the operating room, Dr. Ave Filter encountered gross purulence which was drained, these specimens were sent off for bacterial culture, aerobic, anaerobic, fungal, and AFB culture. Two Jackson-Pratt drains were left in place and Dr. Ave Filter also debrided the clavicle as well acromion slightly as well.  We have been asked to  assist in the management and workup for this patient with septic arthritis of the acromioclavicular joint.  The patient briefly asked me questions about swimming and whether it could introduce pathogens to swimming.  He specifically denies use of hot tubs, but has been swimming quite a bit since he got out of surgery. His travel history is not significant.  PAST MEDICAL HISTORY: 1. Osteoarthritis. 2. Hypertension. 3. Obstructive sleep apnea.  PAST SURGICAL HISTORY:  Resection of acromioclavicular joint, bilateral hip replacement, lumbar surgery with placement of cage, ventral hernias, incisional hernia repair.  FAMILY HISTORY:  Noncontributory.  SOCIAL HISTORY:  The patient is nonsmoker, rare alcohol use.  Married, currently working in Counselling psychologist job.".  FAMILY HISTORY:  Noncontributory.  ALLERGIES:  MORPHINE makes him have hallucinations.  Otherwise, no allergies.  CURRENT MEDICATIONS:  Amlodipine, ascorbic acid, Zetia, fluoxetine, fluticasone, vancomycin, Benadryl, and Ambien.  REVIEW OF SYSTEMS:  As described is the history of present illness, otherwise 12-point review of systems is negative.  PHYSICAL EXAMINATION:  VITAL SIGNS:  Temperature max 98.8, temperature current 98.6, blood pressure 152/87, pulse 72, respirations 20, and pulse ox 97% on room air. GENERAL:  Quite pleasant gentleman in no acute stress. HEENT:  Normocephalic and atraumatic.  Pupils equal, round, and reactive to light.  Sclerae icteric.  Oropharynx clear. NECK:  Supple. CARDIOVASCULAR:  Regular rate and rhythm.  No murmurs, rubs, gallops heard. LUNGS:  Clear to auscultation bilaterally without wheeze or rales. ABDOMEN:  Soft, nondistended, nontender. EXTREMITIES:  Right upper extremity is wrapped, she is wearing a sling, his acromioclavicular joint site is wrapped with dressing. SKIN:  No significant rashes.  LABORATORY DATA:  Chest x-ray done yesterday, no evidence of active pulmonary disease.   C-reactive protein 7.2.  Sedimentation rate was 36. Metabolic panel, sodium 133, potassium 3.5, chloride 102, bicarb 29, BUN and creatinine 9/0.48, glucose 167.  CBC differential white count 7.4, hemoglobin 12.4, and platelets 186.  Cell count differential from fluid showed clot would made him impossible to perform cell count, percentage of cells with 84% neutrophils.  No crystals were seen.  Body fluid culture, no organisms seen, culture is being incubated.  Anaerobic culture and organisms seen being incubated.  Fungal culture, no yeast or fungi seen.  Cultures are incubating.  Blood cultures on July 23, 2010, no growth. MRSA PCR screen negative.  IMPRESSION/RECOMMENDATION:  This is a 60 year old gentleman with septic acromioclavicular joint status post I and D.  Septic acromioclavicular joint:  I agree with vancomycin and would continue this for now and I know his MRSA PCR was negative, but this was not mainly might not have an MRSA infection, deep in the acromioclavicular joint.  Therefore, it was identifiable pathogen.  I will place the patient on contact precautions.  I will hold off on broadening his antibiotics at this point in time, but if he fails to grow organism I would consider placing him on Rocephin as well to more aggressively cover streptococcal species.  Certainly gram negatives are also potential possible culprit here although I do not at this point in time feeling urgency extend him to gram-negative coverage or specifically for Pseudomonas coverage.  I appreciate cultures being sent for AFB and fungal organisms since an atypical Mycobacterium should certainly the possibility in light of his recent negative cultures. Screening, I will screen for a pain for HIV.  Thank you for this Infectious Disease consultation.     Melvin Lav, MD    CV/MEDQ  D:  07/24/2010  T:  07/24/2010  Job:  [id number]  Electronically Signed by Paulette Blanch DAM MD on 08/31/2010  05:44:50 PM

## 2010-09-02 ENCOUNTER — Telehealth: Payer: Self-pay | Admitting: Licensed Clinical Social Worker

## 2010-09-02 NOTE — Telephone Encounter (Signed)
Patient wants to make sure that 09/01/2010 was the correct stop date for his antibiotics, and he wants to know what to do after that. I will check with Dr. Ninetta Lights on Monday 7/30

## 2010-09-04 LAB — AFB CULTURE WITH SMEAR (NOT AT ARMC): Acid Fast Smear: NONE SEEN

## 2010-09-05 NOTE — Telephone Encounter (Signed)
Patient's antibiotic ended on 09/04/2010 and picc was pulled today by his home health nurse as instructed by Dr. Ninetta Lights.

## 2010-09-06 ENCOUNTER — Encounter: Payer: Self-pay | Admitting: Infectious Diseases

## 2010-09-09 ENCOUNTER — Encounter: Payer: Self-pay | Admitting: Infectious Diseases

## 2010-09-16 LAB — AFB CULTURE WITH SMEAR (NOT AT ARMC): Acid Fast Smear: NONE SEEN

## 2012-03-14 DIAGNOSIS — F329 Major depressive disorder, single episode, unspecified: Secondary | ICD-10-CM

## 2012-05-01 ENCOUNTER — Telehealth: Payer: Self-pay | Admitting: Family Medicine

## 2012-05-01 ENCOUNTER — Other Ambulatory Visit: Payer: Self-pay | Admitting: Family Medicine

## 2012-05-01 DIAGNOSIS — W57XXXA Bitten or stung by nonvenomous insect and other nonvenomous arthropods, initial encounter: Secondary | ICD-10-CM

## 2012-05-01 MED ORDER — DOXYCYCLINE HYCLATE 100 MG PO TABS: 100.0000 mg | ORAL_TABLET | Freq: Two times a day (BID) | ORAL | Status: DC

## 2012-05-01 NOTE — Telephone Encounter (Signed)
Patient had a tick bite that might have been on him for about 24 hours. He is pretty sure he got the head of the tick out and the spot is just a little itchy but about a year ago he had [facility] Spotted Fever and he just wanted to make sure he didn't have to take any medicine since he got bit by the tick.

## 2012-05-01 NOTE — Telephone Encounter (Signed)
Dr Christell Constant rxed Doxycycline

## 2012-05-21 ENCOUNTER — Other Ambulatory Visit: Payer: Self-pay | Admitting: *Deleted

## 2012-05-21 MED ORDER — AMLODIPINE BESYLATE 5 MG PO TABS: ORAL_TABLET | ORAL | Status: DC

## 2012-05-28 ENCOUNTER — Other Ambulatory Visit: Payer: Self-pay

## 2012-05-28 MED ORDER — EZETIMIBE 10 MG PO TABS: 10.0000 mg | ORAL_TABLET | Freq: Every day | ORAL | Status: DC

## 2012-05-28 NOTE — Telephone Encounter (Signed)
Last seen 10/03/11  Last lipids 6 11 12

## 2012-06-05 ENCOUNTER — Other Ambulatory Visit: Payer: Self-pay | Admitting: *Deleted

## 2012-06-05 MED ORDER — FINASTERIDE 1 MG PO TABS: 1.0000 mg | ORAL_TABLET | Freq: Every day | ORAL | Status: DC

## 2012-06-05 NOTE — Telephone Encounter (Signed)
LAS OV 8/13

## 2012-06-18 ENCOUNTER — Other Ambulatory Visit: Payer: Self-pay | Admitting: *Deleted

## 2012-06-18 MED ORDER — AMLODIPINE BESYLATE 5 MG PO TABS: ORAL_TABLET | ORAL | Status: DC

## 2012-06-18 NOTE — Telephone Encounter (Signed)
Rx refilled on 4-15. Patient advised that he NTBS. Would you like to do another rf? Please advise. Thank you

## 2012-06-24 ENCOUNTER — Other Ambulatory Visit: Payer: Self-pay

## 2012-06-24 NOTE — Telephone Encounter (Signed)
No lipids since 2012

## 2012-07-12 ENCOUNTER — Ambulatory Visit (INDEPENDENT_AMBULATORY_CARE_PROVIDER_SITE_OTHER): Payer: BC Managed Care – PPO | Admitting: Physician Assistant

## 2012-07-12 ENCOUNTER — Encounter: Payer: Self-pay | Admitting: Physician Assistant

## 2012-07-12 VITALS — BP 144/72 | HR 68 | Temp 97.7°F | Ht 72.0 in | Wt 199.0 lb

## 2012-07-12 DIAGNOSIS — M549 Dorsalgia, unspecified: Secondary | ICD-10-CM

## 2012-07-12 MED ORDER — CYCLOBENZAPRINE HCL 10 MG PO TABS: 10.0000 mg | ORAL_TABLET | Freq: Three times a day (TID) | ORAL | Status: DC | PRN

## 2012-07-12 MED ORDER — METHYLPREDNISOLONE ACETATE 80 MG/ML IJ SUSP
80.0000 mg | Freq: Once | INTRAMUSCULAR | Status: AC
  Administered 2012-07-12: 80 mg via INTRAMUSCULAR

## 2012-07-12 NOTE — Progress Notes (Signed)
Subjective:     Patient ID: Melvin [name], male   DOB: 02/18/1950, 63 y.o.   MRN: [id number]  HPI  Pt with sudden onset of lower back pain last pm He has a long hx of same and includes inst fusion of L4-5 Pt is normally doing well w/o sx He has been doing a little more and thinks he may have over done it Pain to L-spine only- no radiation to legs No change in bowel/bladder function No lower ext numbness  Review of Systems  All other systems reviewed and are negative.       Objective:   Physical Exam  Nursing note and vitals reviewed.  NAD No ecchy/edema seen + Palp muscle spasm R L-spine FROM L-spine- increase in sx with hyperext SLR neg Muscle strength good distal DTR 1+/= lower ext    Assessment:     1. Backache        Plan:     Depomedrol 80 in office today Flexeril rx- SE reviewed Heat/Ice Massage Stretching Cont Ultram prn

## 2012-07-12 NOTE — Patient Instructions (Signed)
Back Exercises Back exercises help treat and prevent back injuries. The goal of back exercises is to increase the strength of your abdominal and back muscles and the flexibility of your back. These exercises should be started when you no longer have back pain. Back exercises include:  Pelvic Tilt. Lie on your back with your knees bent. Tilt your pelvis until the lower part of your back is against the floor. Hold this position 5 to 10 sec and repeat 5 to 10 times.  Knee to Chest. Pull first 1 knee up against your chest and hold for 20 to 30 seconds, repeat this with the other knee, and then both knees. This may be done with the other leg straight or bent, whichever feels better.  Sit-Ups or Curl-Ups. Bend your knees 90 degrees. Start with tilting your pelvis, and do a partial, slow sit-up, lifting your trunk only 30 to 45 degrees off the floor. Take at least 2 to 3 seconds for each sit-up. Do not do sit-ups with your knees out straight. If partial sit-ups are difficult, simply do the above but with only tightening your abdominal muscles and holding it as directed.  Hip-Lift. Lie on your back with your knees flexed 90 degrees. Push down with your feet and shoulders as you raise your hips a couple inches off the floor; hold for 10 seconds, repeat 5 to 10 times.  Back arches. Lie on your stomach, propping yourself up on bent elbows. Slowly press on your hands, causing an arch in your low back. Repeat 3 to 5 times. Any initial stiffness and discomfort should lessen with repetition over time.  Shoulder-Lifts. Lie face down with arms beside your body. Keep hips and torso pressed to floor as you slowly lift your head and shoulders off the floor. Do not overdo your exercises, especially in the beginning. Exercises may cause you some mild back discomfort which lasts for a few minutes; however, if the pain is more severe, or lasts for more than 15 minutes, do not continue exercises until you see your caregiver.  Improvement with exercise therapy for back problems is slow.  See your caregivers for assistance with developing a proper back exercise program. Document Released: 03/02/2004 Document Revised: 04/17/2011 Document Reviewed: 11/24/2010 [employer] Patient Information 2014 [employer].  

## 2012-07-30 ENCOUNTER — Other Ambulatory Visit: Payer: Self-pay

## 2012-07-30 MED ORDER — EZETIMIBE 10 MG PO TABS: 10.0000 mg | ORAL_TABLET | Freq: Every day | ORAL | Status: DC

## 2012-08-07 ENCOUNTER — Other Ambulatory Visit: Payer: Self-pay | Admitting: Family Medicine

## 2012-08-20 ENCOUNTER — Other Ambulatory Visit: Payer: Self-pay | Admitting: *Deleted

## 2012-08-20 MED ORDER — OMEGA-3-ACID ETHYL ESTERS 1 G PO CAPS: 4.0000 g | ORAL_CAPSULE | Freq: Every day | ORAL | Status: DC

## 2012-08-20 MED ORDER — AMLODIPINE BESYLATE 5 MG PO TABS: ORAL_TABLET | ORAL | Status: DC

## 2012-08-20 NOTE — Telephone Encounter (Signed)
LAST LABS 8/13. NTBS

## 2012-09-05 ENCOUNTER — Other Ambulatory Visit: Payer: Self-pay | Admitting: *Deleted

## 2012-09-05 MED ORDER — IBUPROFEN 800 MG PO TABS: 800.0000 mg | ORAL_TABLET | Freq: Three times a day (TID) | ORAL | Status: DC | PRN

## 2012-09-05 MED ORDER — FINASTERIDE 1 MG PO TABS: 1.0000 mg | ORAL_TABLET | Freq: Every day | ORAL | Status: DC

## 2012-09-05 NOTE — Telephone Encounter (Signed)
Please change Motrin said that it has at least 2 refills on it

## 2012-09-05 NOTE — Telephone Encounter (Signed)
Patient last seen in office on 07-12-12 for an acute visit. Please advise Melvin [name])

## 2012-09-23 ENCOUNTER — Other Ambulatory Visit: Payer: Self-pay | Admitting: *Deleted

## 2012-09-23 MED ORDER — FLUTICASONE PROPIONATE 50 MCG/ACT NA SUSP: 1.0000 | Freq: Every day | NASAL | Status: DC

## 2012-10-02 ENCOUNTER — Other Ambulatory Visit: Payer: Self-pay

## 2012-10-02 MED ORDER — FINASTERIDE 1 MG PO TABS: 1.0000 mg | ORAL_TABLET | Freq: Every day | ORAL | Status: DC

## 2012-10-18 DIAGNOSIS — E1159 Type 2 diabetes mellitus with other circulatory complications: Secondary | ICD-10-CM | POA: Insufficient documentation

## 2012-10-21 ENCOUNTER — Other Ambulatory Visit: Payer: Self-pay | Admitting: Family Medicine

## 2012-10-21 ENCOUNTER — Other Ambulatory Visit: Payer: Self-pay

## 2012-10-21 MED ORDER — AMLODIPINE BESYLATE 5 MG PO TABS: ORAL_TABLET | ORAL | Status: DC

## 2012-12-02 ENCOUNTER — Other Ambulatory Visit: Payer: Self-pay

## 2012-12-05 ENCOUNTER — Other Ambulatory Visit: Payer: Self-pay | Admitting: Family Medicine

## 2012-12-06 NOTE — Telephone Encounter (Signed)
Last saw you on 10/03/11

## 2013-01-06 ENCOUNTER — Other Ambulatory Visit: Payer: Self-pay

## 2013-01-06 MED ORDER — FINASTERIDE 1 MG PO TABS: 1.0000 mg | ORAL_TABLET | Freq: Every day | ORAL | Status: DC

## 2013-01-06 NOTE — Telephone Encounter (Signed)
Last seen 07/12/12 [facility]  For backache  Before that 10/03/11  [facility]

## 2013-01-19 ENCOUNTER — Other Ambulatory Visit: Payer: Self-pay | Admitting: Family Medicine

## 2013-01-21 NOTE — Telephone Encounter (Signed)
Last seen 07/12/12  [facility] 

## 2013-01-28 ENCOUNTER — Other Ambulatory Visit: Payer: Self-pay

## 2013-01-28 MED ORDER — OMEGA-3-ACID ETHYL ESTERS 1 G PO CAPS: 4.0000 g | ORAL_CAPSULE | Freq: Every day | ORAL | Status: DC

## 2013-01-28 NOTE — Telephone Encounter (Signed)
Do not see any lipids in EPIC or in chart  Last seen 07/12/12  [facility]

## 2013-02-03 ENCOUNTER — Telehealth: Payer: Self-pay | Admitting: *Deleted

## 2013-02-03 ENCOUNTER — Other Ambulatory Visit: Payer: Self-pay

## 2013-02-03 MED ORDER — FINASTERIDE 1 MG PO TABS: 1.0000 mg | ORAL_TABLET | Freq: Every day | ORAL | Status: DC

## 2013-02-03 NOTE — Telephone Encounter (Signed)
"  Goop" in eye, not red or painful. Used antibiotic drop few yrs ago Can we call in something to the drug store.

## 2013-02-03 NOTE — Telephone Encounter (Signed)
Last seen 07/12/12  [facility] 

## 2013-02-03 NOTE — Telephone Encounter (Signed)
You may call in Tobrex ophthalmic drops one drop to each eye or eyes for 7-10 days

## 2013-02-04 MED ORDER — TOBRAMYCIN 0.3 % OP OINT: 1.0000 "application " | TOPICAL_OINTMENT | Freq: Three times a day (TID) | OPHTHALMIC | Status: DC

## 2013-02-04 NOTE — Telephone Encounter (Signed)
Med and pt called

## 2013-02-19 ENCOUNTER — Telehealth: Payer: Self-pay | Admitting: *Deleted

## 2013-02-19 ENCOUNTER — Other Ambulatory Visit: Payer: Self-pay

## 2013-02-19 MED ORDER — AMLODIPINE BESYLATE 5 MG PO TABS: ORAL_TABLET | ORAL | Status: DC

## 2013-02-19 MED ORDER — EZETIMIBE 10 MG PO TABS: 10.0000 mg | ORAL_TABLET | Freq: Every day | ORAL | Status: DC

## 2013-02-19 NOTE — Telephone Encounter (Signed)
Last seen 07/12/12  [facility]  No lipids since EPIC

## 2013-02-19 NOTE — Telephone Encounter (Signed)
Refill done and appt made

## 2013-03-07 ENCOUNTER — Other Ambulatory Visit: Payer: Self-pay | Admitting: Nurse Practitioner

## 2013-03-10 NOTE — Telephone Encounter (Signed)
Last seen 07/12/12  [facility]

## 2013-03-12 ENCOUNTER — Ambulatory Visit: Payer: BC Managed Care – PPO | Admitting: Family Medicine

## 2013-03-21 ENCOUNTER — Other Ambulatory Visit: Payer: Self-pay | Admitting: *Deleted

## 2013-03-21 MED ORDER — OMEGA-3-ACID ETHYL ESTERS 1 G PO CAPS: 4.0000 g | ORAL_CAPSULE | Freq: Every day | ORAL | Status: DC

## 2013-03-21 MED ORDER — EZETIMIBE 10 MG PO TABS: 10.0000 mg | ORAL_TABLET | Freq: Every day | ORAL | Status: DC

## 2013-03-21 NOTE — Telephone Encounter (Signed)
Patient last seen in June 2014. Please advise

## 2013-04-15 ENCOUNTER — Ambulatory Visit (INDEPENDENT_AMBULATORY_CARE_PROVIDER_SITE_OTHER): Payer: BC Managed Care – PPO | Admitting: Family Medicine

## 2013-04-15 ENCOUNTER — Encounter: Payer: Self-pay | Admitting: Family Medicine

## 2013-04-15 ENCOUNTER — Ambulatory Visit (INDEPENDENT_AMBULATORY_CARE_PROVIDER_SITE_OTHER): Payer: BC Managed Care – PPO

## 2013-04-15 VITALS — BP 131/72 | HR 71 | Temp 97.2°F | Ht 72.0 in | Wt 196.0 lb

## 2013-04-15 DIAGNOSIS — Z0000 Encounter for general adult medical examination without abnormal findings: Secondary | ICD-10-CM

## 2013-04-15 DIAGNOSIS — E1169 Type 2 diabetes mellitus with other specified complication: Secondary | ICD-10-CM | POA: Insufficient documentation

## 2013-04-15 DIAGNOSIS — N529 Male erectile dysfunction, unspecified: Secondary | ICD-10-CM

## 2013-04-15 DIAGNOSIS — IMO0001 Reserved for inherently not codable concepts without codable children: Secondary | ICD-10-CM

## 2013-04-15 DIAGNOSIS — Z23 Encounter for immunization: Secondary | ICD-10-CM

## 2013-04-15 DIAGNOSIS — M1990 Unspecified osteoarthritis, unspecified site: Secondary | ICD-10-CM

## 2013-04-15 DIAGNOSIS — R7309 Other abnormal glucose: Secondary | ICD-10-CM

## 2013-04-15 DIAGNOSIS — Z789 Other specified health status: Secondary | ICD-10-CM

## 2013-04-15 DIAGNOSIS — IMO0002 Reserved for concepts with insufficient information to code with codable children: Secondary | ICD-10-CM

## 2013-04-15 DIAGNOSIS — I10 Essential (primary) hypertension: Secondary | ICD-10-CM

## 2013-04-15 DIAGNOSIS — E785 Hyperlipidemia, unspecified: Secondary | ICD-10-CM

## 2013-04-15 DIAGNOSIS — K219 Gastro-esophageal reflux disease without esophagitis: Secondary | ICD-10-CM

## 2013-04-15 DIAGNOSIS — N400 Enlarged prostate without lower urinary tract symptoms: Secondary | ICD-10-CM

## 2013-04-15 LAB — POCT CBC
Granulocyte percent: 62.4 %G (ref 37–80)
HCT, POC: 47 % (ref 43.5–53.7)
HEMOGLOBIN: 14.7 g/dL (ref 14.1–18.1)
Lymph, poc: 2.2 (ref 0.6–3.4)
MCH, POC: 28.4 pg (ref 27–31.2)
MCHC: 31.2 g/dL — AB (ref 31.8–35.4)
MCV: 91 fL (ref 80–97)
MPV: 8.2 fL (ref 0–99.8)
POC GRANULOCYTE: 3.9 (ref 2–6.9)
POC LYMPH PERCENT: 34.5 %L (ref 10–50)
Platelet Count, POC: 159 10*3/uL (ref 142–424)
RBC: 5.2 M/uL (ref 4.69–6.13)
RDW, POC: 13.9 %
WBC: 6.3 10*3/uL (ref 4.6–10.2)

## 2013-04-15 LAB — POCT UA - MICROSCOPIC ONLY
BACTERIA, U MICROSCOPIC: NEGATIVE
Casts, Ur, LPF, POC: NEGATIVE
Crystals, Ur, HPF, POC: NEGATIVE
MUCUS UA: NEGATIVE
RBC, URINE, MICROSCOPIC: NEGATIVE
WBC, UR, HPF, POC: NEGATIVE
YEAST UA: NEGATIVE

## 2013-04-15 LAB — POCT URINALYSIS DIPSTICK
BILIRUBIN UA: NEGATIVE
GLUCOSE UA: NEGATIVE
KETONES UA: NEGATIVE
Leukocytes, UA: NEGATIVE
NITRITE UA: NEGATIVE
Protein, UA: NEGATIVE
RBC UA: NEGATIVE
Spec Grav, UA: >=1.03
Urobilinogen, UA: NEGATIVE
pH, UA: 6

## 2013-04-15 MED ORDER — FLUOXETINE HCL 20 MG PO CAPS: ORAL_CAPSULE | ORAL | Status: DC

## 2013-04-15 MED ORDER — FLUTICASONE PROPIONATE 50 MCG/ACT NA SUSP: 1.0000 | Freq: Every day | NASAL | Status: DC

## 2013-04-15 MED ORDER — SILDENAFIL CITRATE 100 MG PO TABS: ORAL_TABLET | ORAL | Status: DC

## 2013-04-15 MED ORDER — FINASTERIDE 1 MG PO TABS: 1.0000 mg | ORAL_TABLET | Freq: Every day | ORAL | Status: DC

## 2013-04-15 MED ORDER — OMEGA-3-ACID ETHYL ESTERS 1 G PO CAPS: 4.0000 g | ORAL_CAPSULE | Freq: Every day | ORAL | Status: DC

## 2013-04-15 MED ORDER — AMLODIPINE BESYLATE 5 MG PO TABS: ORAL_TABLET | ORAL | Status: DC

## 2013-04-15 MED ORDER — EZETIMIBE 10 MG PO TABS: 10.0000 mg | ORAL_TABLET | Freq: Every day | ORAL | Status: DC

## 2013-04-15 NOTE — Patient Instructions (Addendum)
Continue current medications. Continue good therapeutic lifestyle changes which include good diet and exercise. Fall precautions discussed with patient. If an FOBT was given today- please return it to our front desk. If you are over 65 years old - you may need Prevnar 83 or the adult Pneumonia vaccine.  Please continue with your current diet and aggressive exercise program We will schedule you for colonoscopy  as it has been 10 years since you had your last one  we will schedule you for a visit with a cardiologist to get a stress test Please return the FOBT Please check with your insurance regarding the shingles vaccine and Prevnar vaccination We will call you with the results of the lab work once those results are available

## 2013-04-15 NOTE — Progress Notes (Signed)
Subjective:    Patient ID: Melvin [name], male    DOB: 11-May-1950, 63 y.o.   MRN: [id number]  HPI Patient is here today for annual wellness exam and follow up of chronic medical problems. The patient is also here for his annual exam and needs updates on his immunizations and an health maintenance issues. He is UA PSA, prostate, chest x-ray FOBT and lab work. He is also due to receive a DTaP. He will check with his insurance regarding the shingle shot and the Prevnar vaccine.        Patient Active Problem List   Diagnosis Date Noted  . BCC (basal cell carcinoma), face   . Essential hypertension, benign   . GERD (gastroesophageal reflux disease)   . Hiatal hernia   . Degeneration of intervertebral disc, site unspecified   . Esophageal stricture   . Sleep apnea   . Depressive disorder, not elsewhere classified   . Osteoarthrosis and allied disorders   . Hyperplasia of prostate    Outpatient Encounter Prescriptions as of 04/15/2013  Medication Sig  . amLODipine (NORVASC) 5 MG tablet Take 1 and 1/2 tab qd.NTBS  . aspirin 81 MG EC tablet Take 81 mg by mouth daily after supper.    . Cholecalciferol (VITAMIN D3) 1000 UNITS CAPS Take 1 capsule by mouth daily.    Marland Kitchen ezetimibe (ZETIA) 10 MG tablet Take 1 tablet (10 mg total) by mouth daily.  . finasteride (PROPECIA) 1 MG tablet Take 1 tablet (1 mg total) by mouth daily.  Marland Kitchen FLUoxetine (PROZAC) 20 MG capsule TAKE 2 CAPSULES BY MOUTH IN THE MORNING AND 1 CAPSULE AT NIGHT  . fluticasone (FLONASE) 50 MCG/ACT nasal spray Place 1 spray into the nose daily. Each nostril  . ibuprofen (ADVIL,MOTRIN) 800 MG tablet Take 1 tablet (800 mg total) by mouth every 8 (eight) hours as needed for pain.  Marland Kitchen omega-3 acid ethyl esters (LOVAZA) 1 G capsule Take 4 capsules (4 g total) by mouth daily.  . cyclobenzaprine (FLEXERIL) 10 MG tablet Take 1 tablet (10 mg total) by mouth 3 (three) times daily as needed for muscle spasms.  Marland Kitchen VIAGRA 100 MG tablet TAKE AS  DIRECTED  . [DISCONTINUED] doxycycline (VIBRA-TABS) 100 MG tablet Take 1 tablet (100 mg total) by mouth 2 (two) times daily.  . [DISCONTINUED] tobramycin (TOBREX) 0.3 % ophthalmic ointment Place 1 application into both eyes 3 (three) times daily. For 7 to 10 days    Review of Systems  Constitutional: Negative.   HENT: Negative.   Eyes: Negative.   Respiratory: Negative.   Cardiovascular: Negative.   Gastrointestinal: Negative.   Endocrine: Negative.   Genitourinary: Negative.   Musculoskeletal: Negative.   Skin: Negative.   Allergic/Immunologic: Negative.   Neurological: Negative.   Hematological: Negative.   Psychiatric/Behavioral: Negative.        Objective:   Physical Exam  Nursing note and vitals reviewed. Constitutional: He is oriented to person, place, and time. He appears well-developed and well-nourished. No distress.  HENT:  Head: Normocephalic and atraumatic.  Right Ear: External ear normal.  Left Ear: External ear normal.  Nose: Nose normal.  Mouth/Throat: Oropharynx is clear and moist. No oropharyngeal exudate.  Ears cerumen was removed from the left ear canal  Eyes: Conjunctivae and EOM are normal. Pupils are equal, round, and reactive to light. Right eye exhibits no discharge. Left eye exhibits no discharge. No scleral icterus.  Neck: Normal range of motion. Neck supple. No thyromegaly present.  No carotid  bruits were auscultated  Cardiovascular: Normal rate, regular rhythm, normal heart sounds and intact distal pulses.  Exam reveals no gallop and no friction rub.   No murmur heard. At 72 per minute  Pulmonary/Chest: Effort normal and breath sounds normal. No respiratory distress. He has no wheezes. He has no rales. He exhibits no tenderness.  Abdominal: Soft. Bowel sounds are normal. He exhibits no mass. There is no tenderness. There is no rebound and no guarding.  Genitourinary: Rectum normal and penis normal.  The prostate was enlarged but soft and smooth.  There were no lumps or masses. There were no rectal masses. There was no inguinal hernia palpated nor were there any inguinal nodes. The testicles were normal   Musculoskeletal: Normal range of motion. He exhibits no edema and no tenderness.  Multiple scars to back and bilateral hips from previous surgeries for  Lymphadenopathy:    He has no cervical adenopathy.  Neurological: He is alert and oriented to person, place, and time. He has normal reflexes. No cranial nerve deficit.  Skin: Skin is warm and dry. No rash noted. No erythema. No pallor.  Psychiatric: He has a normal mood and affect. His behavior is normal. Judgment and thought content normal.   BP 131/72  Pulse 71  Temp(Src) 97.2 F (36.2 C) (Oral)  Ht 6' (1.829 m)  Wt 196 lb (88.905 kg)  BMI 26.58 kg/m2  [facility] reading (PRIMARY) by  Dr. Brunilda Payor x-ray-no active disease  EKG: -No significant change from previous EKG                                          Assessment & Plan:  1. Essential hypertension, benign - POCT CBC - BMP8+EGFR - Hepatic function panel - DG Chest 2 View; Future - EKG 12-Lead  2. GERD (gastroesophageal reflux disease) - POCT CBC  3. Osteoarthrosis and allied disorders - POCT CBC - Testosterone,Free and Total - Thyroid Panel With TSH - Vit D  25 hydroxy (rtn osteoporosis monitoring)  4. Annual physical exam - POCT CBC - POCT UA - Microscopic Only - POCT urinalysis dipstick - BMP8+EGFR - Hepatic function panel - PSA, total and free - NMR, lipoprofile - Testosterone,Free and Total - Thyroid Panel With TSH - Vit D  25 hydroxy (rtn osteoporosis monitoring) - DG Chest 2 View; Future - EKG 12-Lead  5. Statin intolerance  6. Hyperlipidemia -Continue aggressive low-carb diet  7. Erectile dysfunction -Continue Viagra as needed  8. BPH (benign prostatic hyperplasia)   Meds ordered this encounter  Medications  . FLUoxetine (PROZAC) 20 MG capsule    Sig: TAKE 2 CAPSULES BY MOUTH  IN THE MORNING AND 1 CAPSULE AT NIGHT    Dispense:  90 capsule    Refill:  6  . sildenafil (VIAGRA) 100 MG tablet    Sig: TAKE AS DIRECTED    Dispense:  12 tablet    Refill:  2  . omega-3 acid ethyl esters (LOVAZA) 1 G capsule    Sig: Take 4 capsules (4 g total) by mouth daily.    Dispense:  120 capsule    Refill:  6  . fluticasone (FLONASE) 50 MCG/ACT nasal spray    Sig: Place 1 spray into both nostrils daily. Each nostril    Dispense:  16 g    Refill:  6  . finasteride (PROPECIA) 1 MG tablet    Sig:  Take 1 tablet (1 mg total) by mouth daily.    Dispense:  30 tablet    Refill:  6  . amLODipine (NORVASC) 5 MG tablet    Sig: Take 1 and 1/2 tab qd.NTBS    Dispense:  45 tablet    Refill:  6  . ezetimibe (ZETIA) 10 MG tablet    Sig: Take 1 tablet (10 mg total) by mouth daily.    Dispense:  30 tablet    Refill:  6   Patient Instructions  Continue current medications. Continue good therapeutic lifestyle changes which include good diet and exercise. Fall precautions discussed with patient. If an FOBT was given today- please return it to our front desk. If you are over 56 years old - you may need Prevnar 54 or the adult Pneumonia vaccine.  Please continue with your current diet and aggressive exercise program We will schedule you for colonoscopy  as it has been 10 years since you had your last one  we will schedule you for a visit with a cardiologist to get a stress test Please return the FOBT Please check with your insurance regarding the shingles vaccine and Prevnar vaccination We will call you with the results of the lab work once those results are available        Arrie Senate MD

## 2013-04-17 LAB — BMP8+EGFR
BUN / CREAT RATIO: 23 — AB (ref 10–22)
BUN: 19 mg/dL (ref 8–27)
CO2: 21 mmol/L (ref 18–29)
CREATININE: 0.82 mg/dL (ref 0.76–1.27)
Calcium: 9.1 mg/dL (ref 8.6–10.2)
Chloride: 105 mmol/L (ref 97–108)
GFR calc Af Amer: 108 mL/min/{1.73_m2} (ref >59)
GFR calc non Af Amer: 93 mL/min/{1.73_m2} (ref >59)
Glucose: 117 mg/dL — ABNORMAL HIGH (ref 65–99)
Potassium: 4.3 mmol/L (ref 3.5–5.2)
Sodium: 143 mmol/L (ref 134–144)

## 2013-04-17 LAB — THYROID PANEL WITH TSH
Free Thyroxine Index: 2.3 (ref 1.2–4.9)
T3 UPTAKE RATIO: 31 % (ref 24–39)
T4 TOTAL: 7.5 ug/dL (ref 4.5–12.0)
TSH: 2.25 u[IU]/mL (ref 0.450–4.500)

## 2013-04-17 LAB — NMR, LIPOPROFILE
Cholesterol: 168 mg/dL (ref <200)
HDL CHOLESTEROL BY NMR: 51 mg/dL (ref >=40)
HDL Particle Number: 34.1 umol/L (ref >=30.5)
LDL PARTICLE NUMBER: 1426 nmol/L — AB (ref <1000)
LDL Size: 20.4 nm — ABNORMAL LOW (ref >20.5)
LDLC SERPL CALC-MCNC: 104 mg/dL — ABNORMAL HIGH (ref <100)
LP-IR Score: 50 — ABNORMAL HIGH (ref <=45)
SMALL LDL PARTICLE NUMBER: 766 nmol/L — AB (ref <=527)
TRIGLYCERIDES BY NMR: 66 mg/dL (ref <150)

## 2013-04-17 LAB — PSA, TOTAL AND FREE
PSA, Free Pct: 32 %
PSA, Free: 0.16 ng/mL
PSA: 0.5 ng/mL (ref 0.0–4.0)

## 2013-04-17 LAB — HEPATIC FUNCTION PANEL
ALBUMIN: 4.3 g/dL (ref 3.6–4.8)
ALT: 21 IU/L (ref 0–44)
AST: 22 IU/L (ref 0–40)
Alkaline Phosphatase: 55 IU/L (ref 39–117)
BILIRUBIN TOTAL: 0.4 mg/dL (ref 0.0–1.2)
Bilirubin, Direct: 0.12 mg/dL (ref 0.00–0.40)
Total Protein: 6.6 g/dL (ref 6.0–8.5)

## 2013-04-17 LAB — TESTOSTERONE,FREE AND TOTAL
Testosterone, Free: >50 pg/mL — ABNORMAL HIGH (ref 6.6–18.1)
Testosterone: 428 ng/dL (ref 348–1197)

## 2013-04-17 LAB — VITAMIN D 25 HYDROXY (VIT D DEFICIENCY, FRACTURES): Vit D, 25-Hydroxy: 53.5 ng/mL (ref 30.0–100.0)

## 2013-04-28 LAB — POCT GLYCOSYLATED HEMOGLOBIN (HGB A1C)

## 2013-04-28 NOTE — Addendum Note (Signed)
Addended by: Pollyann Kennedy F on: 04/28/2013 04:30 PM   Modules accepted: Orders

## 2013-04-30 ENCOUNTER — Telehealth: Payer: Self-pay | Admitting: Family Medicine

## 2013-04-30 NOTE — Telephone Encounter (Signed)
Left results on personal voicemail.

## 2013-05-15 ENCOUNTER — Telehealth: Payer: Self-pay | Admitting: Family Medicine

## 2013-05-28 NOTE — Telephone Encounter (Signed)
Patient did not return call. Encounter closed.  

## 2013-06-05 ENCOUNTER — Institutional Professional Consult (permissible substitution): Payer: Managed Care, Other (non HMO) | Admitting: Cardiology

## 2013-07-25 ENCOUNTER — Telehealth: Payer: Self-pay | Admitting: Family Medicine

## 2013-07-26 MED ORDER — DOXYCYCLINE HYCLATE 100 MG PO TABS: 100.0000 mg | ORAL_TABLET | Freq: Two times a day (BID) | ORAL | Status: DC

## 2013-07-26 NOTE — Telephone Encounter (Signed)
rx sent to pharmacy.patient aware. 

## 2013-08-28 ENCOUNTER — Ambulatory Visit (INDEPENDENT_AMBULATORY_CARE_PROVIDER_SITE_OTHER): Payer: BC Managed Care – PPO | Admitting: Family Medicine

## 2013-08-28 ENCOUNTER — Ambulatory Visit (INDEPENDENT_AMBULATORY_CARE_PROVIDER_SITE_OTHER): Payer: BC Managed Care – PPO

## 2013-08-28 ENCOUNTER — Encounter: Payer: Self-pay | Admitting: Family Medicine

## 2013-08-28 VITALS — BP 120/69 | HR 70 | Temp 97.9°F | Ht 72.0 in | Wt 199.0 lb

## 2013-08-28 DIAGNOSIS — M779 Enthesopathy, unspecified: Secondary | ICD-10-CM

## 2013-08-28 DIAGNOSIS — M25429 Effusion, unspecified elbow: Secondary | ICD-10-CM

## 2013-08-28 DIAGNOSIS — M25422 Effusion, left elbow: Secondary | ICD-10-CM

## 2013-08-28 DIAGNOSIS — M898X9 Other specified disorders of bone, unspecified site: Secondary | ICD-10-CM

## 2013-08-28 DIAGNOSIS — S5000XA Contusion of unspecified elbow, initial encounter: Secondary | ICD-10-CM

## 2013-08-28 DIAGNOSIS — S5002XA Contusion of left elbow, initial encounter: Secondary | ICD-10-CM

## 2013-08-28 NOTE — Patient Instructions (Signed)
Avoid heavy lifting 

## 2013-08-28 NOTE — Progress Notes (Signed)
Subjective:    Patient ID: Melvin [name], male    DOB: 10-12-1950, 63 y.o.   MRN: [id number]  HPI Patient here today for left elbow swelling and soreness. X-ray was done today in office. There is no history of any injury.       Patient Active Problem List   Diagnosis Date Noted  . Erectile dysfunction 04/15/2013  . Statin intolerance 04/15/2013  . Hyperlipidemia 04/15/2013  . BCC (basal cell carcinoma), face   . Essential hypertension, benign   . Hiatal hernia with gastroesophageal reflux    . Degeneration of intervertebral disc, site unspecified   . Esophageal stricture, history of   . Sleep apnea, history of   . Depression   . Osteoarthritis   . BPH (benign prostatic hyperplasia)    Outpatient Encounter Prescriptions as of 08/28/2013  Medication Sig  . amLODipine (NORVASC) 5 MG tablet Take 1 and 1/2 tab qd.NTBS  . aspirin 81 MG EC tablet Take 81 mg by mouth daily after supper.    . Cholecalciferol (VITAMIN D3) 1000 UNITS CAPS Take 1 capsule by mouth daily.    Marland Kitchen ezetimibe (ZETIA) 10 MG tablet Take 1 tablet (10 mg total) by mouth daily.  . finasteride (PROPECIA) 1 MG tablet Take 1 tablet (1 mg total) by mouth daily.  Marland Kitchen FLUoxetine (PROZAC) 20 MG capsule TAKE 2 CAPSULES BY MOUTH IN THE MORNING AND 1 CAPSULE AT NIGHT  . fluticasone (FLONASE) 50 MCG/ACT nasal spray Place 1 spray into both nostrils daily. Each nostril  . omega-3 acid ethyl esters (LOVAZA) 1 G capsule Take 4 capsules (4 g total) by mouth daily.  . [DISCONTINUED] ibuprofen (ADVIL,MOTRIN) 800 MG tablet Take 1 tablet (800 mg total) by mouth every 8 (eight) hours as needed for pain.  . sildenafil (VIAGRA) 100 MG tablet TAKE AS DIRECTED  . [DISCONTINUED] cyclobenzaprine (FLEXERIL) 10 MG tablet Take 1 tablet (10 mg total) by mouth 3 (three) times daily as needed for muscle spasms.  . [DISCONTINUED] doxycycline (VIBRA-TABS) 100 MG tablet Take 1 tablet (100 mg total) by mouth 2 (two) times daily.    Review of Systems    Constitutional: Negative.   HENT: Negative.   Eyes: Negative.   Respiratory: Negative.   Cardiovascular: Negative.   Gastrointestinal: Negative.   Endocrine: Negative.   Genitourinary: Negative.   Musculoskeletal: Negative.        Left elbow edema- x 2 weeks  Skin: Negative.   Allergic/Immunologic: Negative.   Neurological: Negative.   Hematological: Negative.   Psychiatric/Behavioral: Negative.        Objective:   Physical Exam  Constitutional: He is oriented to person, place, and time. He appears well-developed and well-nourished.  HENT:  Head: Normocephalic.  Musculoskeletal: Normal range of motion. He exhibits edema.  There is soft tissue swelling near the left elbow. There is no rubor or erythema.  Neurological: He is alert and oriented to person, place, and time.  Skin: Skin is warm and dry. No rash noted. No erythema. No pallor.  Psychiatric: He has a normal mood and affect. His behavior is normal. Judgment and thought content normal.   BP 120/69  Pulse 70  Temp(Src) 97.9 F (36.6 C) (Oral)  Ht 6' (1.829 m)  Wt 199 lb (90.266 kg)  BMI 26.98 kg/m2   [facility] reading (PRIMARY) by  Dr.Marvel Sapp-left elbow--- spur is present with fluid accumulation  Assessment & Plan:  1. Swelling of left elbow joint - DG Elbow 2 Views Left; Future  2. Traumatic hematoma of left elbow, initial encounter  3. Bony spur Patient Instructions  Avoid heavy lifting   Arrie Senate MD

## 2013-09-26 ENCOUNTER — Other Ambulatory Visit: Payer: Self-pay | Admitting: Family Medicine

## 2013-09-29 NOTE — Telephone Encounter (Signed)
Last ov 08/28/13

## 2013-10-15 ENCOUNTER — Other Ambulatory Visit: Payer: Self-pay | Admitting: Family Medicine

## 2013-11-02 ENCOUNTER — Other Ambulatory Visit: Payer: Self-pay | Admitting: Family Medicine

## 2013-11-17 ENCOUNTER — Other Ambulatory Visit: Payer: Self-pay | Admitting: Family Medicine

## 2013-11-18 NOTE — Telephone Encounter (Signed)
Last seen 08/28/13  DWM  If approved route to nurse to call into [facility]

## 2013-11-25 ENCOUNTER — Other Ambulatory Visit: Payer: Self-pay | Admitting: Family Medicine

## 2013-11-29 ENCOUNTER — Other Ambulatory Visit: Payer: Self-pay | Admitting: Family Medicine

## 2013-12-01 NOTE — Telephone Encounter (Signed)
Last seen 08/28/13  DWM

## 2013-12-04 ENCOUNTER — Other Ambulatory Visit: Payer: Self-pay | Admitting: Family Medicine

## 2013-12-09 ENCOUNTER — Other Ambulatory Visit: Payer: Self-pay | Admitting: Family Medicine

## 2013-12-28 ENCOUNTER — Emergency Department (HOSPITAL_COMMUNITY)
Admission: EM | Admit: 2013-12-28 | Discharge: 2013-12-28 | Disposition: A | Discharge status: Home / Self Care | Payer: BC Managed Care – PPO | Attending: Emergency Medicine | Admitting: Emergency Medicine

## 2013-12-28 ENCOUNTER — Encounter (HOSPITAL_COMMUNITY): Payer: Self-pay | Admitting: Emergency Medicine

## 2013-12-28 ENCOUNTER — Emergency Department (HOSPITAL_COMMUNITY): Payer: BC Managed Care – PPO

## 2013-12-28 DIAGNOSIS — Y9301 Activity, walking, marching and hiking: Secondary | ICD-10-CM | POA: Diagnosis not present

## 2013-12-28 DIAGNOSIS — Z720 Tobacco use: Secondary | ICD-10-CM | POA: Diagnosis not present

## 2013-12-28 DIAGNOSIS — Z79899 Other long term (current) drug therapy: Secondary | ICD-10-CM | POA: Insufficient documentation

## 2013-12-28 DIAGNOSIS — Z7982 Long term (current) use of aspirin: Secondary | ICD-10-CM | POA: Diagnosis not present

## 2013-12-28 DIAGNOSIS — Z85828 Personal history of other malignant neoplasm of skin: Secondary | ICD-10-CM | POA: Insufficient documentation

## 2013-12-28 DIAGNOSIS — Z87448 Personal history of other diseases of urinary system: Secondary | ICD-10-CM | POA: Diagnosis not present

## 2013-12-28 DIAGNOSIS — Z8739 Personal history of other diseases of the musculoskeletal system and connective tissue: Secondary | ICD-10-CM | POA: Insufficient documentation

## 2013-12-28 DIAGNOSIS — Y9289 Other specified places as the place of occurrence of the external cause: Secondary | ICD-10-CM | POA: Diagnosis not present

## 2013-12-28 DIAGNOSIS — S7001XA Contusion of right hip, initial encounter: Secondary | ICD-10-CM | POA: Insufficient documentation

## 2013-12-28 DIAGNOSIS — G8929 Other chronic pain: Secondary | ICD-10-CM | POA: Diagnosis not present

## 2013-12-28 DIAGNOSIS — Y998 Other external cause status: Secondary | ICD-10-CM | POA: Diagnosis not present

## 2013-12-28 DIAGNOSIS — Z791 Long term (current) use of non-steroidal anti-inflammatories (NSAID): Secondary | ICD-10-CM | POA: Diagnosis not present

## 2013-12-28 DIAGNOSIS — W541XXA Struck by dog, initial encounter: Secondary | ICD-10-CM | POA: Diagnosis not present

## 2013-12-28 DIAGNOSIS — S50811A Abrasion of right forearm, initial encounter: Secondary | ICD-10-CM | POA: Diagnosis not present

## 2013-12-28 DIAGNOSIS — Z96643 Presence of artificial hip joint, bilateral: Secondary | ICD-10-CM | POA: Diagnosis not present

## 2013-12-28 DIAGNOSIS — I10 Essential (primary) hypertension: Secondary | ICD-10-CM | POA: Insufficient documentation

## 2013-12-28 DIAGNOSIS — Z7951 Long term (current) use of inhaled steroids: Secondary | ICD-10-CM | POA: Insufficient documentation

## 2013-12-28 DIAGNOSIS — W010XXA Fall on same level from slipping, tripping and stumbling without subsequent striking against object, initial encounter: Secondary | ICD-10-CM | POA: Insufficient documentation

## 2013-12-28 DIAGNOSIS — M25551 Pain in right hip: Secondary | ICD-10-CM

## 2013-12-28 DIAGNOSIS — Z8719 Personal history of other diseases of the digestive system: Secondary | ICD-10-CM | POA: Insufficient documentation

## 2013-12-28 DIAGNOSIS — S79911A Unspecified injury of right hip, initial encounter: Secondary | ICD-10-CM | POA: Diagnosis present

## 2013-12-28 NOTE — Discharge Instructions (Signed)
Contusion °A contusion is a deep bruise. Contusions happen when an injury causes bleeding under the skin. Signs of bruising include pain, puffiness (swelling), and discolored skin. The contusion may turn blue, purple, or yellow. °HOME CARE  °· Put ice on the injured area. °¨ Put ice in a plastic bag. °¨ Place a towel between your skin and the bag. °¨ Leave the ice on for 15-20 minutes, 03-04 times a day. °· Only take medicine as told by your doctor. °· Rest the injured area. °· If possible, raise (elevate) the injured area to lessen puffiness. °GET HELP RIGHT AWAY IF:  °· You have more bruising or puffiness. °· You have pain that is getting worse. °· Your puffiness or pain is not helped by medicine. °MAKE SURE YOU:  °· Understand these instructions. °· Will watch your condition. °· Will get help right away if you are not doing well or get worse. °Document Released: 07/12/2007 Document Revised: 04/17/2011 Document Reviewed: 11/28/2010 °Exit[employer]® Patient Information ©2015 [employer]. This information is not intended to replace advice given to you by your health care provider. Make sure you discuss any questions you have with your health care provider. ° °

## 2013-12-28 NOTE — ED Notes (Signed)
Patient c/o right hip pain and left elbow/forearm "soreness". Large contusion noted to right forearm. Per patient was walking dog yesterday when dog wrapped leash around his legs and pulled legs "out from under him." Patient reports landing on right side. Denies hitting head or LOC. Patient has hx of hip replacement.

## 2013-12-28 NOTE — ED Provider Notes (Signed)
CSN: [id number]     Arrival date & time 12/28/13  1042 History  This chart was scribed for Kem Parkinson, PA-C with Janice Norrie, MD by Edison Simon, ED Scribe. This patient was seen in room APFT24/APFT24 and the patient's care was started at 12:34 PM.    Chief Complaint  Patient presents with  . Fall  . Hip Pain   The history is provided by the patient. No language interpreter was used.   HPI Comments: Melvin [name] is a 63 y.o. male with prior bilateral hip replacement in 2001 who presents to the Emergency Department complaining of right hip pain status post falling yesterday. He states he was walking his dog when it tangled its leash in his legs and tripped him up and he fell onto his right side. He states it is mostly painful when he walks or puts weight on his legs; he states he was able to ambulate normally at first but it has worsened since then. He notes some bruising to his hip. He reports some associated pain in his right thigh. He denies pain or numbness lower in his leg or groin; he denies pain higher up or in his abdomen; he denies pain in his back or buttocks. He denies pain or numbness to his right arm, though he notes a contusion to his forearm.  He reports using baby ASA chronically as well as Ibuprofen for chronic back pain, with improvement. He states Ibuprofen yesterday improved his hip pain.  Past Medical History  Diagnosis Date  . Essential hypertension, benign   . GERD (gastroesophageal reflux disease)   . Hiatal hernia   . Degeneration of intervertebral disc, site unspecified   . Esophageal stricture   . Sleep apnea   . Depressive disorder, not elsewhere classified   . Osteoarthrosis and allied disorders   . Hyperplasia of prostate   . BCC (basal cell carcinoma), face    Past Surgical History  Procedure Laterality Date  . Appendectomy  [id number]  . Nasal septum surgery  1989  . Left hip replaacement  01-1999  . Right hip replacement  09-2002  . Lumbar fusion   05-1998  . Ventral hernia repair  02-2000   Family History  Problem Relation Age of Onset  . Lymphoma Mother   . Arthritis Mother   . Asthma Mother   . Kidney failure Father   . Hypertension Father   . Hypertension Brother   . Hyperlipidemia Brother   . Bipolar disorder Brother    History  Substance Use Topics  . Smoking status: Current Some Day Smoker    Types: Cigars  . Smokeless tobacco: Never Used  . Alcohol Use: Yes     Comment: mix    Review of Systems  Constitutional: Negative for fever and chills.  Gastrointestinal: Negative for abdominal pain.  Genitourinary: Negative for dysuria and difficulty urinating.  Musculoskeletal: Positive for joint swelling and arthralgias (right hip). Negative for back pain.  Skin: Negative for color change. Wound: contusion to right forearm.  Neurological: Negative for numbness.  All other systems reviewed and are negative.     Allergies  Morphine and Statins  Home Medications   Prior to Admission medications   Medication Sig Start Date End Date Taking? Authorizing Provider  amLODipine (NORVASC) 5 MG tablet Take 1 and 1/2 tab qd.NTBS 04/15/13   Chipper Herb, MD  amLODipine (NORVASC) 5 MG tablet TAKE 1 AND 1/2 TABLETS DAILY 12/08/13   Chipper Herb, MD  aspirin 81 MG EC tablet Take 81 mg by mouth daily after supper.      Historical Provider, MD  Cholecalciferol (VITAMIN D3) 1000 UNITS CAPS Take 1 capsule by mouth daily.      Historical Provider, MD  finasteride (PROPECIA) 1 MG tablet TAKE ONE (1) TABLET EACH DAY 11/03/13   Chipper Herb, MD  FLUoxetine (PROZAC) 20 MG capsule TAKE 2 CAPSULES BY MOUTH IN THE MORNING AND 1 CAPSULE AT NIGHT 12/01/13   Chipper Herb, MD  fluticasone [facility]) 50 MCG/ACT nasal spray Place 1 spray into both nostrils daily. Each nostril 04/15/13   Chipper Herb, MD  ibuprofen (ADVIL,MOTRIN) 800 MG tablet TAKE ONE TABLET 3 TIMES A DAY AS NEEDED. 12/11/13   Chipper Herb, MD  omega-3 acid ethyl esters  (LOVAZA) 1 G capsule Take 4 capsules (4 g total) by mouth daily. 04/15/13   Chipper Herb, MD  VIAGRA 100 MG tablet TAKE AS DIRECTED 11/27/13   Chipper Herb, MD  ZETIA 10 MG tablet TAKE ONE (1) TABLET EACH DAY 10/16/13   Chipper Herb, MD   BP 134/78 mmHg  Pulse 70  Temp(Src) 98 F (36.7 C) (Oral)  Resp 16  Ht 6' (1.829 m)  Wt 195 lb (88.451 kg)  BMI 26.44 kg/m2  SpO2 98% Physical Exam  Constitutional: He is oriented to person, place, and time. He appears well-developed and well-nourished.  HENT:  Head: Normocephalic and atraumatic.  Eyes: Conjunctivae are normal.  Neck: Normal range of motion. Neck supple.  Cardiovascular: Normal rate, regular rhythm, normal heart sounds and intact distal pulses.   No murmur heard. Pulmonary/Chest: Effort normal and breath sounds normal. No respiratory distress. He has no wheezes. He has no rales.  Abdominal: Soft. He exhibits no distension. There is no tenderness. There is no rebound and no guarding.  Musculoskeletal: Normal range of motion.  Localized ecchymosis lateral right hip over surgical incision Patient has full ROM of the hip 5/5 strength against resistance bilateral lower extremities DP pulse and sensation intact Pt has full ROM of the right elbow and wrist.  No bony tenderness of the forearm.   Neurological: He is alert and oriented to person, place, and time.  Skin: Skin is warm and dry.  superficial abrasion to the right forearm.  No edema or bleeding.  Psychiatric: He has a normal mood and affect.  Nursing note and vitals reviewed.   ED Course  Procedures (including critical care time)  DIAGNOSTIC STUDIES: Oxygen Saturation is 98% on room air, normal by my interpretation.    COORDINATION OF CARE: 12:46 PM Discussed with patient that his x-ray has benign findings; the hardware from prior hip replacement is in place. Discussed treatment plan with patient at beside, including rest, continued use of crutches, and Ultram with  follow up. The patient agrees with the plan and has no further questions at this time.   Labs Review Labs Reviewed - No data to display  Imaging Review Dg Hip Complete Right  12/28/2013   CLINICAL DATA:  63 year old male with fall and right hip pain. History of bilateral hip replacements.  EXAM: RIGHT HIP - COMPLETE 2+ VIEW  COMPARISON:  None.  FINDINGS: Bilateral total hip replacements are identified.  There is no evidence of acute fracture, subluxation or dislocation.  No complicating hardware features are identified.  Surgical changes in the lower lumbar spine identified.  IMPRESSION: No evidence of acute abnormality.  Bilateral total hip replacements and prior lower lumbar  surgery.   Electronically Signed   By: Hassan Rowan M.D.   On: 12/28/2013 12:15     EKG Interpretation None      MDM   Final diagnoses:  Contusion, hip, right, initial encounter    Pt with likely contusion of the right hip.  No focal neuro deficits.  NV intact.  Pt ambulates well.  Compartments of the right LE are soft.  He agrees to symptomatic tx and close f/u with his PMD or orthopedics.   Pt appears stable for d/c  I personally performed the services described in this documentation, which was scribed in my presence. The recorded information has been reviewed and is accurate.    Jerlean Peralta L. [place], PA-C 01/05/14 Aberdeen Knapp, MD 01/06/14 [phone]

## 2013-12-31 ENCOUNTER — Other Ambulatory Visit: Payer: Self-pay | Admitting: Family Medicine

## 2014-01-13 ENCOUNTER — Other Ambulatory Visit: Payer: Self-pay | Admitting: Family Medicine

## 2014-02-06 ENCOUNTER — Other Ambulatory Visit: Payer: Self-pay | Admitting: Family Medicine

## 2014-02-09 ENCOUNTER — Telehealth: Payer: Self-pay | Admitting: Family Medicine

## 2014-02-09 MED ORDER — SILDENAFIL CITRATE 100 MG PO TABS: ORAL_TABLET | ORAL | Status: DC

## 2014-02-09 NOTE — Telephone Encounter (Signed)
Refill sent to pharm and CPE was scheduled for march - comes yearly

## 2014-02-10 ENCOUNTER — Other Ambulatory Visit: Payer: Self-pay | Admitting: Family Medicine

## 2014-02-15 ENCOUNTER — Other Ambulatory Visit: Payer: Self-pay | Admitting: Family Medicine

## 2014-02-26 ENCOUNTER — Other Ambulatory Visit: Payer: Self-pay | Admitting: Family Medicine

## 2014-03-05 ENCOUNTER — Other Ambulatory Visit: Payer: Self-pay | Admitting: Family Medicine

## 2014-03-31 ENCOUNTER — Other Ambulatory Visit: Payer: Self-pay | Admitting: Family Medicine

## 2014-04-12 ENCOUNTER — Other Ambulatory Visit: Payer: Self-pay | Admitting: Family Medicine

## 2014-04-13 NOTE — Telephone Encounter (Signed)
Last seen 7/15 DWM  Upcoming appt 3/16  If approved route to nurse to [facility] into [facility]

## 2014-04-22 ENCOUNTER — Ambulatory Visit: Payer: Self-pay | Admitting: Family Medicine

## 2014-04-24 ENCOUNTER — Other Ambulatory Visit: Payer: Self-pay | Admitting: Family Medicine

## 2014-04-24 NOTE — Telephone Encounter (Signed)
Appt sched for 4/7

## 2014-05-06 ENCOUNTER — Other Ambulatory Visit: Payer: Self-pay | Admitting: Family Medicine

## 2014-05-14 ENCOUNTER — Ambulatory Visit (INDEPENDENT_AMBULATORY_CARE_PROVIDER_SITE_OTHER): Payer: BLUE CROSS/BLUE SHIELD | Admitting: Family Medicine

## 2014-05-14 ENCOUNTER — Encounter: Payer: Self-pay | Admitting: Family Medicine

## 2014-05-14 VITALS — BP 104/57 | HR 80 | Temp 98.0°F | Ht 72.0 in | Wt 199.0 lb

## 2014-05-14 DIAGNOSIS — F32A Depression, unspecified: Secondary | ICD-10-CM

## 2014-05-14 DIAGNOSIS — I10 Essential (primary) hypertension: Secondary | ICD-10-CM

## 2014-05-14 DIAGNOSIS — K219 Gastro-esophageal reflux disease without esophagitis: Secondary | ICD-10-CM

## 2014-05-14 DIAGNOSIS — N400 Enlarged prostate without lower urinary tract symptoms: Secondary | ICD-10-CM

## 2014-05-14 DIAGNOSIS — M150 Primary generalized (osteo)arthritis: Secondary | ICD-10-CM

## 2014-05-14 DIAGNOSIS — M159 Polyosteoarthritis, unspecified: Secondary | ICD-10-CM

## 2014-05-14 DIAGNOSIS — Z0000 Encounter for general adult medical examination without abnormal findings: Secondary | ICD-10-CM | POA: Diagnosis not present

## 2014-05-14 DIAGNOSIS — N5201 Erectile dysfunction due to arterial insufficiency: Secondary | ICD-10-CM

## 2014-05-14 DIAGNOSIS — F329 Major depressive disorder, single episode, unspecified: Secondary | ICD-10-CM

## 2014-05-14 DIAGNOSIS — E785 Hyperlipidemia, unspecified: Secondary | ICD-10-CM

## 2014-05-14 LAB — POCT URINALYSIS DIPSTICK
BILIRUBIN UA: NEGATIVE
Blood, UA: NEGATIVE
Glucose, UA: NEGATIVE
KETONES UA: NEGATIVE
Leukocytes, UA: NEGATIVE
Nitrite, UA: NEGATIVE
PH UA: 6
PROTEIN UA: NEGATIVE
Spec Grav, UA: >=1.03
Urobilinogen, UA: NEGATIVE

## 2014-05-14 LAB — POCT UA - MICROSCOPIC ONLY
BACTERIA, U MICROSCOPIC: NEGATIVE
CASTS, UR, LPF, POC: NEGATIVE
CRYSTALS, UR, HPF, POC: NEGATIVE
Mucus, UA: NEGATIVE
WBC, Ur, HPF, POC: NEGATIVE
YEAST UA: NEGATIVE

## 2014-05-14 NOTE — Patient Instructions (Addendum)
Medicare Annual Wellness Visit  [facility] and the medical providers at [facility] strive to bring you the best medical care.  In doing so we not only want to address your current medical conditions and concerns but also to detect new conditions early and prevent illness, disease and health-related problems.    Medicare offers a yearly Wellness Visit which allows our clinical staff to assess your need for preventative services including immunizations, lifestyle education, counseling to decrease risk of preventable diseases and screening for fall risk and other medical concerns.    This visit is provided free of charge (no copay) for all Medicare recipients. The clinical pharmacists at [facility] have begun to conduct these Wellness Visits which will also include a thorough review of all your medications.    As you primary medical provider recommend that you make an appointment for your Annual Wellness Visit if you have not done so already this year.  You may set up this appointment before you leave today or you may call back ([phone]) and schedule an appointment.  Please make sure when you call that you mention that you are scheduling your Annual Wellness Visit with the clinical pharmacist so that the appointment may be made for the proper length of time.     Continue current medications. Continue good therapeutic lifestyle changes which include good diet and exercise. Fall precautions discussed with patient. If an FOBT was given today- please return it to our front desk. If you are over 70 years old - you may need Prevnar 52 or the adult Pneumonia vaccine.  Flu Shots are still available at our office. If you still haven't had one please call to set up a nurse visit to get one.   After your visit with [place] today you will receive a survey in the mail or online from [employer] regarding your care with [place]. Please take a moment to  fill this out. Your feedback is very important to [place] as you can help [place] better understand your patient needs as well as improve your experience and satisfaction. WE CARE ABOUT YOU!!!    Check cost of Zostavax - the shingles vaccine.  The Prevnar vaccine that you received today may make your arm sore Always be careful and do not put yourself at risk for falling and avoid climbing Stay is active as you can physically be We will put you on a list to have a stress test this summer Monitor blood pressure readings at home and bring these readings with you when you get your blood work done so we can see if her blood pressure continues to run as low as it was today.

## 2014-05-14 NOTE — Progress Notes (Signed)
Subjective:    Patient ID: Melvin [name], male    DOB: 10-07-1950, 64 y.o.   MRN: [id number]  HPI Patient is here today for annual wellness exam and follow up of chronic medical problems which includes hypertension, BPH, and hyperlipidemia. He is taking medications regularly. The patient has a history of back surgeries and hip replacement.        Patient Active Problem List   Diagnosis Date Noted  . Erectile dysfunction 04/15/2013  . Statin intolerance 04/15/2013  . Hyperlipidemia 04/15/2013  . BCC (basal cell carcinoma), face   . Essential hypertension, benign   . Hiatal hernia with gastroesophageal reflux    . Degeneration of intervertebral disc, site unspecified   . Esophageal stricture, history of   . Sleep apnea, history of   . Depression   . Osteoarthritis   . BPH (benign prostatic hyperplasia)    Outpatient Encounter Prescriptions as of 05/14/2014  Medication Sig  . amLODipine (NORVASC) 5 MG tablet Take 1 and 1/2 tab qd.NTBS  . aspirin 81 MG EC tablet Take 81 mg by mouth daily after supper.    . Cholecalciferol (VITAMIN D3) 1000 UNITS CAPS Take 1 capsule by mouth daily.    . finasteride (PROPECIA) 1 MG tablet TAKE ONE (1) TABLET EACH DAY  . FLUoxetine (PROZAC) 20 MG capsule TAKE 2 CAPSULES BY MOUTH IN THE MORNING AND 1 CAPSULE AT NIGHT  . fluticasone (FLONASE) 50 MCG/ACT nasal spray Place 1 spray into both nostrils daily. Each nostril  . ibuprofen (ADVIL,MOTRIN) 800 MG tablet TAKE ONE TABLET 3 TIMES A DAY AS NEEDED.  Marland Kitchen omega-3 acid ethyl esters (LOVAZA) 1 G capsule TAKE 4 CAPSULES DAILY  . ZETIA 10 MG tablet TAKE ONE (1) TABLET EACH DAY  . VIAGRA 100 MG tablet TAKE AS DIRECTED  . [DISCONTINUED] amLODipine (NORVASC) 5 MG tablet TAKE 1 AND 1/2 TABLETS DAILY  . [DISCONTINUED] FLUoxetine (PROZAC) 20 MG capsule TAKE 2 CAPSULES BY MOUTH IN THE MORNING AND 1 CAPSULE AT NIGHT    Review of Systems  Constitutional: Negative.   HENT: Negative.   Eyes: Negative.     Respiratory: Negative.   Cardiovascular: Negative.   Gastrointestinal: Negative.   Endocrine: Negative.   Genitourinary: Negative.   Musculoskeletal: Negative.   Skin: Negative.   Allergic/Immunologic: Negative.   Neurological: Negative.   Hematological: Negative.   Psychiatric/Behavioral: Negative.        Objective:   Physical Exam  Constitutional: He is oriented to person, place, and time. He appears well-developed and well-nourished. No distress.  HENT:  Head: Normocephalic and atraumatic.  Right Ear: External ear normal.  Left Ear: External ear normal.  Nose: Nose normal.  Mouth/Throat: Oropharynx is clear and moist. No oropharyngeal exudate.  Eyes: Conjunctivae and EOM are normal. Pupils are equal, round, and reactive to light. Right eye exhibits no discharge. Left eye exhibits no discharge. No scleral icterus.  Neck: Normal range of motion. Neck supple. No thyromegaly present.  No carotid bruits or anterior cervical adenopathy  Cardiovascular: Normal rate, regular rhythm and intact distal pulses.   No murmur heard. 72/m with a regular rate and rhythm  Pulmonary/Chest: Effort normal and breath sounds normal. No respiratory distress. He has no wheezes. He has no rales. He exhibits no tenderness.  Clear anteriorly and posteriorly and no axillary adenopathy. No chest wall masses.  Abdominal: Soft. Bowel sounds are normal. He exhibits no mass. There is no tenderness. There is no rebound and no guarding.  No inguinal  adenopathy and no abdominal bruits  Genitourinary: Rectum normal and penis normal.  The prostate was enlarged but soft and smooth without lumps or masses. There were no rectal masses. There were no inguinal hernias palpable. The external genitalia were normal. There were no inguinal nodes.  Musculoskeletal: Normal range of motion. He exhibits no edema or tenderness.  The patient is doing well following replacement of both hips and back surgeries for degenerative  disc disease. He has good mobility of both knees.  Lymphadenopathy:    He has no cervical adenopathy.  Neurological: He is alert and oriented to person, place, and time. He has normal reflexes. No cranial nerve deficit.  Skin: Skin is warm and dry. No rash noted. No erythema. No pallor.  Psychiatric: He has a normal mood and affect. His behavior is normal. Judgment and thought content normal.  Nursing note and vitals reviewed.         Assessment & Plan:  1. Gastroesophageal reflux disease, esophagitis presence not specified -This is doing well he is having no complaints with this - POCT CBC; Future  2. Essential hypertension, benign -The blood pressure in fact was low today. He will record some additional readings and bring them with him to the office when his lab work is done. - POCT CBC; Future - Hepatic function panel; Future  3. Annual physical exam -The patient is up-to-date on his colonoscopies. -He is in need of the Prevnar vaccine and a shingles shot and will check with his insurance regarding the shingles vaccine. -We will plan to do a exercise stress test on him this summer. - POCT CBC; Future - BMP8+EGFR; Future - Hepatic function panel; Future - PSA, total and free; Future - NMR, lipoprofile; Future - Vit D  25 hydroxy (rtn osteoporosis monitoring); Future - Thyroid Panel With TSH; Future - POCT urinalysis dipstick - POCT UA - Microscopic Only  4. Hyperlipidemia -The patient has been intolerant of statin drugs and can only take steady of for his cholesterol. - POCT CBC; Future - NMR, lipoprofile; Future  5. BPH (benign prostatic hyperplasia) -The prostate is smooth and enlarged and he is currently not having any symptoms with this. - POCT CBC; Future - PSA, total and free; Future - POCT urinalysis dipstick - POCT UA - Microscopic Only  6. Primary osteoarthritis involving multiple joints -The patient is doing generally well with his back problems and hip  problems and has not had any major complaints of pain in the past few months.  7. Erectile dysfunction due to arterial insufficiency -He continues to take Viagra for this.  8. Depression -He is currently doing well with taking Prozac for this.  No orders of the defined types were placed in this encounter.   Patient Instructions                       Medicare Annual Wellness Visit  [facility] and the medical providers at [facility] strive to bring you the best medical care.  In doing so we not only want to address your current medical conditions and concerns but also to detect new conditions early and prevent illness, disease and health-related problems.    Medicare offers a yearly Wellness Visit which allows our clinical staff to assess your need for preventative services including immunizations, lifestyle education, counseling to decrease risk of preventable diseases and screening for fall risk and other medical concerns.    This visit is provided free of charge (  no copay) for all Medicare recipients. The clinical pharmacists at [facility] have begun to conduct these Wellness Visits which will also include a thorough review of all your medications.    As you primary medical provider recommend that you make an appointment for your Annual Wellness Visit if you have not done so already this year.  You may set up this appointment before you leave today or you may call back [phone]) and schedule an appointment.  Please make sure when you call that you mention that you are scheduling your Annual Wellness Visit with the clinical pharmacist so that the appointment may be made for the proper length of time.     Continue current medications. Continue good therapeutic lifestyle changes which include good diet and exercise. Fall precautions discussed with patient. If an FOBT was given today- please return it to our front desk. If you are over 43 years  old - you may need Prevnar 33 or the adult Pneumonia vaccine.  Flu Shots are still available at our office. If you still haven't had one please call to set up a nurse visit to get one.   After your visit with [place] today you will receive a survey in the mail or online from [employer] regarding your care with [place]. Please take a moment to fill this out. Your feedback is very important to [place] as you can help [place] better understand your patient needs as well as improve your experience and satisfaction. WE CARE ABOUT YOU!!!    Check cost of Zostavax - the shingles vaccine.  The Prevnar vaccine that you received today may make your arm sore Always be careful and do not put yourself at risk for falling and avoid climbing Stay is active as you can physically be We will put you on a list to have a stress test this summer Monitor blood pressure readings at home and bring these readings with you when you get your blood work done so we can see if her blood pressure continues to run as low as it was today.    Arrie Senate MD

## 2014-05-16 ENCOUNTER — Other Ambulatory Visit: Payer: Self-pay | Admitting: Family Medicine

## 2014-05-21 ENCOUNTER — Other Ambulatory Visit (INDEPENDENT_AMBULATORY_CARE_PROVIDER_SITE_OTHER): Payer: BLUE CROSS/BLUE SHIELD

## 2014-05-21 DIAGNOSIS — N400 Enlarged prostate without lower urinary tract symptoms: Secondary | ICD-10-CM

## 2014-05-21 DIAGNOSIS — E785 Hyperlipidemia, unspecified: Secondary | ICD-10-CM | POA: Diagnosis not present

## 2014-05-21 DIAGNOSIS — K219 Gastro-esophageal reflux disease without esophagitis: Secondary | ICD-10-CM

## 2014-05-21 DIAGNOSIS — Z0000 Encounter for general adult medical examination without abnormal findings: Secondary | ICD-10-CM | POA: Diagnosis not present

## 2014-05-21 DIAGNOSIS — I10 Essential (primary) hypertension: Secondary | ICD-10-CM | POA: Diagnosis not present

## 2014-05-21 LAB — POCT CBC
Granulocyte percent: 52.7 %G (ref 37–80)
HEMATOCRIT: 46.1 % (ref 43.5–53.7)
Hemoglobin: 14.5 g/dL (ref 14.1–18.1)
LYMPH, POC: 2 (ref 0.6–3.4)
MCH: 28.8 pg (ref 27–31.2)
MCHC: 31.6 g/dL — AB (ref 31.8–35.4)
MCV: 91.2 fL (ref 80–97)
MPV: 6.7 fL (ref 0–99.8)
POC Granulocyte: 2.7 (ref 2–6.9)
POC LYMPH PERCENT: 40 %L (ref 10–50)
Platelet Count, POC: 178 10*3/uL (ref 142–424)
RBC: 5.05 M/uL (ref 4.69–6.13)
RDW, POC: 13.4 %
WBC: 5.1 10*3/uL (ref 4.6–10.2)

## 2014-05-21 NOTE — Progress Notes (Signed)
LAB ONLY FROM 05-14-2014

## 2014-05-23 ENCOUNTER — Other Ambulatory Visit: Payer: Self-pay | Admitting: Family Medicine

## 2014-05-23 LAB — NMR, LIPOPROFILE
Cholesterol: 195 mg/dL (ref 100–199)
HDL CHOLESTEROL BY NMR: 57 mg/dL (ref >39)
HDL Particle Number: 34.8 umol/L (ref >=30.5)
LDL PARTICLE NUMBER: 2061 nmol/L — AB (ref <1000)
LDL SIZE: 20.2 nm (ref >20.5)
LDL-C: 126 mg/dL — ABNORMAL HIGH (ref 0–99)
LP-IR SCORE: 59 — AB (ref <=45)
Small LDL Particle Number: 1263 nmol/L — ABNORMAL HIGH (ref <=527)
TRIGLYCERIDES BY NMR: 61 mg/dL (ref 0–149)

## 2014-05-23 LAB — THYROID PANEL WITH TSH
Free Thyroxine Index: 2.2 (ref 1.2–4.9)
T3 Uptake Ratio: 31 % (ref 24–39)
T4 TOTAL: 7.1 ug/dL (ref 4.5–12.0)
TSH: 1.78 u[IU]/mL (ref 0.450–4.500)

## 2014-05-23 LAB — PSA, TOTAL AND FREE
PSA FREE: 0.14 ng/mL
PSA, Free Pct: 28 %
PSA: 0.5 ng/mL (ref 0.0–4.0)

## 2014-05-23 LAB — BMP8+EGFR
BUN / CREAT RATIO: 26 — AB (ref 10–22)
BUN: 18 mg/dL (ref 8–27)
CALCIUM: 9.4 mg/dL (ref 8.6–10.2)
CHLORIDE: 103 mmol/L (ref 97–108)
CO2: 22 mmol/L (ref 18–29)
Creatinine, Ser: 0.7 mg/dL — ABNORMAL LOW (ref 0.76–1.27)
GFR calc Af Amer: 114 mL/min/{1.73_m2} (ref >59)
GFR calc non Af Amer: 99 mL/min/{1.73_m2} (ref >59)
GLUCOSE: 142 mg/dL — AB (ref 65–99)
Potassium: 4.8 mmol/L (ref 3.5–5.2)
Sodium: 140 mmol/L (ref 134–144)

## 2014-05-23 LAB — HEPATIC FUNCTION PANEL
ALK PHOS: 58 IU/L (ref 39–117)
ALT: 27 IU/L (ref 0–44)
AST: 23 IU/L (ref 0–40)
Albumin: 4.2 g/dL (ref 3.6–4.8)
BILIRUBIN TOTAL: 0.6 mg/dL (ref 0.0–1.2)
Bilirubin, Direct: 0.13 mg/dL (ref 0.00–0.40)
Total Protein: 6.5 g/dL (ref 6.0–8.5)

## 2014-05-23 LAB — VITAMIN D 25 HYDROXY (VIT D DEFICIENCY, FRACTURES): VIT D 25 HYDROXY: 55.8 ng/mL (ref 30.0–100.0)

## 2014-05-25 ENCOUNTER — Other Ambulatory Visit: Payer: Self-pay | Admitting: Family Medicine

## 2014-05-25 NOTE — Telephone Encounter (Signed)
Last seen 05/14/14 DWM  If approved route to  Nurse to call into [facility]

## 2014-05-25 NOTE — Telephone Encounter (Signed)
This is okay to refill for one year 

## 2014-06-14 ENCOUNTER — Other Ambulatory Visit: Payer: Self-pay | Admitting: Family Medicine

## 2014-06-16 NOTE — Telephone Encounter (Signed)
Please check with patient this is the third request and would be the third time I've refill this medication in the past 4-6 weeks

## 2014-06-19 ENCOUNTER — Other Ambulatory Visit: Payer: Self-pay | Admitting: Family Medicine

## 2014-06-22 ENCOUNTER — Other Ambulatory Visit: Payer: Self-pay | Admitting: Family Medicine

## 2014-07-06 ENCOUNTER — Other Ambulatory Visit: Payer: Self-pay | Admitting: Family Medicine

## 2014-07-15 ENCOUNTER — Other Ambulatory Visit: Payer: Self-pay | Admitting: Family Medicine

## 2014-07-23 ENCOUNTER — Other Ambulatory Visit: Payer: Self-pay | Admitting: Family Medicine

## 2014-10-10 ENCOUNTER — Other Ambulatory Visit: Payer: Self-pay | Admitting: Family Medicine

## 2014-10-13 NOTE — Telephone Encounter (Signed)
Last seen 05/14/14 DWM

## 2014-10-31 ENCOUNTER — Other Ambulatory Visit: Payer: Self-pay | Admitting: Family Medicine

## 2014-11-10 ENCOUNTER — Other Ambulatory Visit: Payer: Self-pay | Admitting: Family Medicine

## 2014-11-18 ENCOUNTER — Other Ambulatory Visit: Payer: Self-pay | Admitting: Family Medicine

## 2014-12-01 ENCOUNTER — Ambulatory Visit (INDEPENDENT_AMBULATORY_CARE_PROVIDER_SITE_OTHER): Payer: BLUE CROSS/BLUE SHIELD | Admitting: *Deleted

## 2014-12-01 DIAGNOSIS — Z23 Encounter for immunization: Secondary | ICD-10-CM

## 2015-01-04 ENCOUNTER — Other Ambulatory Visit: Payer: Self-pay | Admitting: Family Medicine

## 2015-01-04 NOTE — Telephone Encounter (Signed)
Last seen 05/14/14  DWM  If approved route to nurse to call into [facility]

## 2015-01-04 NOTE — Telephone Encounter (Signed)
Last seen 05/14/14 DWM 

## 2015-01-18 ENCOUNTER — Other Ambulatory Visit: Payer: Self-pay | Admitting: Family Medicine

## 2015-01-19 NOTE — Telephone Encounter (Signed)
Last seen 05/24/14  DWM

## 2015-02-03 ENCOUNTER — Other Ambulatory Visit: Payer: Self-pay | Admitting: Family Medicine

## 2015-02-04 NOTE — Telephone Encounter (Signed)
Last seen 05/14/14 DWM 

## 2015-02-17 ENCOUNTER — Other Ambulatory Visit: Payer: Self-pay | Admitting: Family Medicine

## 2015-02-17 NOTE — Telephone Encounter (Signed)
Last seen 05/14/14 DWM 

## 2015-02-19 ENCOUNTER — Other Ambulatory Visit: Payer: Self-pay | Admitting: Family Medicine

## 2015-02-26 ENCOUNTER — Other Ambulatory Visit: Payer: Self-pay | Admitting: Family Medicine

## 2015-02-26 DIAGNOSIS — E1141 Type 2 diabetes mellitus with diabetic mononeuropathy: Secondary | ICD-10-CM

## 2015-03-02 ENCOUNTER — Other Ambulatory Visit: Payer: Self-pay | Admitting: Family Medicine

## 2015-03-02 NOTE — Telephone Encounter (Signed)
Last seen 05/14/14 DWM 

## 2015-03-20 ENCOUNTER — Other Ambulatory Visit: Payer: Self-pay | Admitting: Family Medicine

## 2015-03-22 NOTE — Telephone Encounter (Signed)
Last seen 05/14/14 DWM 

## 2015-04-02 ENCOUNTER — Other Ambulatory Visit: Payer: Self-pay | Admitting: Family Medicine

## 2015-04-02 NOTE — Telephone Encounter (Signed)
Last seen 05/17/14  DWM

## 2015-04-12 ENCOUNTER — Other Ambulatory Visit: Payer: Self-pay | Admitting: Family Medicine

## 2015-04-14 ENCOUNTER — Other Ambulatory Visit: Payer: Self-pay | Admitting: Family Medicine

## 2015-04-15 NOTE — Telephone Encounter (Signed)
Have patient switch to vascepa instead of Lovaza. This may be more efficacious.

## 2015-04-18 ENCOUNTER — Other Ambulatory Visit: Payer: Self-pay | Admitting: Family Medicine

## 2015-05-03 ENCOUNTER — Other Ambulatory Visit: Payer: Self-pay | Admitting: Family Medicine

## 2015-05-03 NOTE — Telephone Encounter (Signed)
Last seen 05/14/14 DWM 

## 2015-05-06 ENCOUNTER — Other Ambulatory Visit: Payer: Self-pay | Admitting: Family Medicine

## 2015-05-06 NOTE — Telephone Encounter (Signed)
Last lipid  05/21/14  DWM

## 2015-05-17 ENCOUNTER — Other Ambulatory Visit: Payer: Self-pay | Admitting: Family Medicine

## 2015-05-17 NOTE — Telephone Encounter (Signed)
Last seen 05/14/2014

## 2015-05-29 ENCOUNTER — Other Ambulatory Visit: Payer: Self-pay | Admitting: Family Medicine

## 2015-06-07 ENCOUNTER — Other Ambulatory Visit: Payer: Self-pay | Admitting: Family Medicine

## 2015-06-10 ENCOUNTER — Telehealth: Payer: Self-pay

## 2015-06-10 NOTE — Telephone Encounter (Signed)
Insurance denied ezetimibe  Must try and fail Brand name Zetia first

## 2015-06-10 NOTE — Telephone Encounter (Signed)
Give brand name Zetia

## 2015-06-28 ENCOUNTER — Other Ambulatory Visit: Payer: Self-pay | Admitting: Family Medicine

## 2015-06-30 ENCOUNTER — Other Ambulatory Visit: Payer: Self-pay | Admitting: Family Medicine

## 2015-06-30 NOTE — Telephone Encounter (Signed)
Last seen 05/14/14

## 2015-07-16 ENCOUNTER — Other Ambulatory Visit: Payer: Self-pay | Admitting: Family Medicine

## 2015-07-16 NOTE — Telephone Encounter (Signed)
Last seen 05/14/14  Last filled 03/02/15

## 2015-07-17 ENCOUNTER — Other Ambulatory Visit: Payer: Self-pay | Admitting: Family Medicine

## 2015-08-03 ENCOUNTER — Other Ambulatory Visit: Payer: Self-pay | Admitting: Family Medicine

## 2015-08-03 NOTE — Telephone Encounter (Signed)
Last labs 05/2014, no future appt

## 2015-08-04 ENCOUNTER — Other Ambulatory Visit: Payer: Self-pay | Admitting: Family Medicine

## 2015-08-29 ENCOUNTER — Other Ambulatory Visit: Payer: Self-pay | Admitting: Family Medicine

## 2015-10-05 ENCOUNTER — Other Ambulatory Visit: Payer: Self-pay | Admitting: Family Medicine

## 2015-10-14 ENCOUNTER — Other Ambulatory Visit: Payer: Self-pay | Admitting: Family Medicine

## 2015-11-17 ENCOUNTER — Telehealth: Payer: Self-pay | Admitting: Family Medicine

## 2015-11-17 NOTE — Telephone Encounter (Signed)
Called and left message to [facility] that [facility] would be 11-18-15 to do the records and would be next week before they received them.

## 2015-11-23 ENCOUNTER — Ambulatory Visit (INDEPENDENT_AMBULATORY_CARE_PROVIDER_SITE_OTHER): Payer: BLUE CROSS/BLUE SHIELD | Admitting: *Deleted

## 2015-11-23 DIAGNOSIS — Z23 Encounter for immunization: Secondary | ICD-10-CM | POA: Diagnosis not present

## 2015-11-30 ENCOUNTER — Other Ambulatory Visit: Payer: Self-pay | Admitting: Family Medicine

## 2015-12-13 ENCOUNTER — Other Ambulatory Visit: Payer: Self-pay | Admitting: Family Medicine

## 2015-12-15 ENCOUNTER — Telehealth: Payer: Self-pay | Admitting: Family Medicine

## 2015-12-15 NOTE — Telephone Encounter (Signed)
Patient has a follow up appointment scheduled. 

## 2015-12-27 ENCOUNTER — Other Ambulatory Visit: Payer: Self-pay | Admitting: Family Medicine

## 2015-12-28 ENCOUNTER — Telehealth: Payer: Self-pay | Admitting: Family Medicine

## 2015-12-28 NOTE — Telephone Encounter (Signed)
lmtcb jkp 11/21

## 2015-12-28 NOTE — Telephone Encounter (Signed)
Patient is in process of getting some insurance and needs to know when and where he had his last sleep study and stress test.  I advised patient that these records are not in EPIC but that I would have his paper chart pulled and sent back to [place] and we would call him back with this information.  Asked Yisroel Ramming to have pulled and sent back.

## 2016-01-10 ENCOUNTER — Telehealth: Payer: Self-pay | Admitting: Family Medicine

## 2016-01-10 NOTE — Telephone Encounter (Signed)
Pt called stated that he is following up on Medical Records he is needed and that Roselyn Reef is aware. Please call.

## 2016-01-10 NOTE — Telephone Encounter (Signed)
Lm 12/4-jhb

## 2016-01-11 ENCOUNTER — Other Ambulatory Visit: Payer: Self-pay | Admitting: Family Medicine

## 2016-01-12 NOTE — Telephone Encounter (Signed)
I spoke with pt yesterday evening and the paper chart was requested again - LAT aware it is needed

## 2016-01-14 ENCOUNTER — Other Ambulatory Visit: Payer: Self-pay | Admitting: Family Medicine

## 2016-01-18 ENCOUNTER — Telehealth: Payer: Self-pay | Admitting: Family Medicine

## 2016-01-19 NOTE — Telephone Encounter (Signed)
Chart received and given to Jan 01/19/16

## 2016-01-19 NOTE — Telephone Encounter (Signed)
Paper chart pulled and pt aware of dates needed.

## 2016-01-24 ENCOUNTER — Encounter: Payer: Self-pay | Admitting: *Deleted

## 2016-02-04 ENCOUNTER — Ambulatory Visit (INDEPENDENT_AMBULATORY_CARE_PROVIDER_SITE_OTHER): Payer: BLUE CROSS/BLUE SHIELD | Admitting: Family Medicine

## 2016-02-04 ENCOUNTER — Encounter: Payer: Self-pay | Admitting: Family Medicine

## 2016-02-04 VITALS — BP 126/75 | HR 69 | Temp 97.3°F | Ht 72.0 in | Wt 205.0 lb

## 2016-02-04 DIAGNOSIS — R5382 Chronic fatigue, unspecified: Secondary | ICD-10-CM

## 2016-02-04 DIAGNOSIS — I10 Essential (primary) hypertension: Secondary | ICD-10-CM

## 2016-02-04 DIAGNOSIS — N400 Enlarged prostate without lower urinary tract symptoms: Secondary | ICD-10-CM | POA: Diagnosis not present

## 2016-02-04 DIAGNOSIS — G4733 Obstructive sleep apnea (adult) (pediatric): Secondary | ICD-10-CM | POA: Diagnosis not present

## 2016-02-04 DIAGNOSIS — K219 Gastro-esophageal reflux disease without esophagitis: Secondary | ICD-10-CM

## 2016-02-04 DIAGNOSIS — E7800 Pure hypercholesterolemia, unspecified: Secondary | ICD-10-CM | POA: Diagnosis not present

## 2016-02-04 DIAGNOSIS — Z789 Other specified health status: Secondary | ICD-10-CM | POA: Diagnosis not present

## 2016-02-04 DIAGNOSIS — C44319 Basal cell carcinoma of skin of other parts of face: Secondary | ICD-10-CM

## 2016-02-04 DIAGNOSIS — Z0000 Encounter for general adult medical examination without abnormal findings: Secondary | ICD-10-CM

## 2016-02-04 DIAGNOSIS — R5383 Other fatigue: Secondary | ICD-10-CM

## 2016-02-04 LAB — URINALYSIS, COMPLETE
Bilirubin, UA: NEGATIVE
Glucose, UA: NEGATIVE
Ketones, UA: NEGATIVE
Leukocytes, UA: NEGATIVE
NITRITE UA: NEGATIVE
PH UA: 5.5 (ref 5.0–7.5)
Protein, UA: NEGATIVE
Specific Gravity, UA: <1.005 — ABNORMAL LOW (ref 1.005–1.030)
UUROB: 0.2 mg/dL (ref 0.2–1.0)

## 2016-02-04 LAB — MICROSCOPIC EXAMINATION
BACTERIA UA: NONE SEEN
Epithelial Cells (non renal): NONE SEEN /hpf (ref 0–10)
Renal Epithel, UA: NONE SEEN /hpf

## 2016-02-04 MED ORDER — FLUOXETINE HCL 20 MG PO CAPS: ORAL_CAPSULE | ORAL | 11 refills | Status: DC

## 2016-02-04 MED ORDER — AMLODIPINE BESYLATE 5 MG PO TABS: 7.5000 mg | ORAL_TABLET | Freq: Every day | ORAL | 11 refills | Status: DC

## 2016-02-04 MED ORDER — OMEGA-3-ACID ETHYL ESTERS 1 G PO CAPS: 4.0000 | ORAL_CAPSULE | Freq: Every day | ORAL | 11 refills | Status: DC

## 2016-02-04 MED ORDER — FINASTERIDE 1 MG PO TABS: ORAL_TABLET | ORAL | 3 refills | Status: DC

## 2016-02-04 MED ORDER — FLUTICASONE PROPIONATE 50 MCG/ACT NA SUSP: 1.0000 | Freq: Every day | NASAL | 11 refills | Status: DC

## 2016-02-04 MED ORDER — EZETIMIBE 10 MG PO TABS: ORAL_TABLET | ORAL | 11 refills | Status: DC

## 2016-02-04 MED ORDER — SILDENAFIL CITRATE 100 MG PO TABS: ORAL_TABLET | ORAL | 5 refills | Status: DC

## 2016-02-04 NOTE — Progress Notes (Signed)
Subjective:    Patient ID: Melvin [name], male    DOB: 07/21/1950, 65 y.o.   MRN: [id number]  HPI Patient is here today for annual wellness exam and follow up of chronic medical problems which includes hyperlipidemia and hypertension. He is taking medications regularly.The patient has not been in the office in a good while. He sees a lot of so the specialist at [facility]. He is recently had an EKG there showed bradycardia. He's had a chest x-ray which was read as negative. His biggest complaints are fatigue and pain in the right leg. He is requesting refills on his Viagra. The patient does have a history of sleep apnea but has not hasn't his equipment evaluated since the 90s. Because of his daytime fatigue we will schedule him for a visit to see the pulmonologist to have a sleep apnea evaluation and make sure the equipment is working properly. He denies any chest pain. He says he may have a little more shortness of breath than usual. He does exercise by walking and swimming and does not have any chest pressure or tightness with this. He denies any trouble with his stomach including nausea vomiting diarrhea blood in the stool or black tarry bowel movements. He had a colonoscopy in May that was normal but had one previously 2 years before that it had a polyp. This wound was clear. He says he is having no trouble passing his water including burning pain or frequency.    Patient Active Problem List   Diagnosis Date Noted  . Erectile dysfunction 04/15/2013  . Statin intolerance 04/15/2013  . Hyperlipidemia 04/15/2013  . BCC (basal cell carcinoma), face   . Essential hypertension, benign   . Hiatal hernia with gastroesophageal reflux    . Degeneration of intervertebral disc, site unspecified   . Esophageal stricture, history of   . Sleep apnea, history of   . Depression   . Osteoarthritis   . BPH (benign prostatic hyperplasia)    Outpatient Encounter Prescriptions as of 02/04/2016    Medication Sig  . amLODipine (NORVASC) 5 MG tablet TAKE 1 AND 1/2 TABLETS DAILY  . aspirin 81 MG EC tablet Take 81 mg by mouth daily after supper.    . Cholecalciferol (VITAMIN D3) 1000 UNITS CAPS Take 1 capsule by mouth daily.    . finasteride (PROPECIA) 1 MG tablet TAKE ONE (1) TABLET EACH DAY  . FLUoxetine (PROZAC) 20 MG capsule TAKE 2 CAPSULES BY MOUTH EVERY MORNING AND 1 CAPSULE AT NIGHT  . fluticasone (FLONASE) 50 MCG/ACT nasal spray USE 1 SPRAY IN EACH NOSTRIL ONCE DAILY  . ibuprofen (ADVIL,MOTRIN) 800 MG tablet TAKE ONE TABLET 3 TIMES A DAY AS NEEDED.  Marland Kitchen omega-3 acid ethyl esters (LOVAZA) 1 g capsule TAKE 4 CAPSULES DAILY  . VIAGRA 100 MG tablet TAKE AS DIRECTED  . ZETIA 10 MG tablet TAKE ONE (1) TABLET EACH DAY  . VIAGRA 100 MG tablet TAKE AS DIRECTED  . [DISCONTINUED] FLUoxetine (PROZAC) 20 MG capsule TAKE 2 CAPSULES BY MOUTH EVERY MORNING AND 1 CAPSULE AT NIGHT  . [DISCONTINUED] VIAGRA 100 MG tablet TAKE AS DIRECTED  . [DISCONTINUED] VIAGRA 100 MG tablet TAKE AS DIRECTED   No facility-administered encounter medications on file as of 02/04/2016.       Review of Systems  Constitutional: Positive for fatigue.  HENT: Negative.   Eyes: Negative.   Respiratory: Negative.   Cardiovascular: Negative.   Gastrointestinal: Negative.   Endocrine: Negative.   Genitourinary: Positive for  hematuria.  Musculoskeletal: Positive for arthralgias (right leg pain at night ).  Skin: Negative.   Allergic/Immunologic: Negative.   Neurological: Negative.   Hematological: Negative.   Psychiatric/Behavioral: Negative.        Objective:   Physical Exam  Constitutional: He is oriented to person, place, and time. He appears well-developed and well-nourished. No distress.  HENT:  Head: Normocephalic and atraumatic.  Right Ear: External ear normal.  Left Ear: External ear normal.  Nose: Nose normal.  Mouth/Throat: Oropharynx is clear and moist. No oropharyngeal exudate.  Eyes:  Conjunctivae and EOM are normal. Pupils are equal, round, and reactive to light. Right eye exhibits no discharge. Left eye exhibits no discharge. No scleral icterus.  Neck: Normal range of motion. Neck supple. No thyromegaly present.  Cardiovascular: Normal rate, regular rhythm, normal heart sounds and intact distal pulses.   No murmur heard. The heart is good at 72/m. It is regular.  Pulmonary/Chest: Effort normal and breath sounds normal. No respiratory distress. He has no wheezes. He has no rales. He exhibits no tenderness.  Clear anteriorly and posteriorly no axillary adenopathy  Abdominal: Soft. Bowel sounds are normal. He exhibits no mass. There is no tenderness. There is no rebound and no guarding.  No liver or spleen enlargement no bruits and no inguinal adenopathy  Genitourinary: Rectum normal and penis normal.  Genitourinary Comments: The prostate is enlarged but soft and smooth. There are no rectal masses. There are no inguinal hernias palpable Genitalia were within normal limits.  Musculoskeletal: Normal range of motion. He exhibits no edema.  Lymphadenopathy:    He has no cervical adenopathy.  Neurological: He is alert and oriented to person, place, and time. He has normal reflexes. No cranial nerve deficit.  Skin: Skin is warm and dry. No rash noted.  Psychiatric: He has a normal mood and affect. His behavior is normal. Judgment and thought content normal.  Nursing note and vitals reviewed.  BP 126/75 (BP Location: Left Arm)   Pulse 69   Temp 97.3 F (36.3 C) (Oral)   Ht 6' (1.829 m)   Wt 205 lb (93 kg)   BMI 27.80 kg/m         Assessment & Plan:  1. Annual physical exam -We will schedule a reevaluation of his sleep apnea as his equipment has not been looked at for almost 15 years. - BMP8+EGFR - CBC with Differential/Platelet - Hepatic function panel - VITAMIN D 25 Hydroxy (Vit-D Deficiency, Fractures) - Lipid panel - PSA, total and free - Thyroid Panel With  TSH - Urinalysis, Complete - Sedimentation rate  2. Essential hypertension, benign -The blood pressure is good today and he will continue with his current treatment. - BMP8+EGFR - Hepatic function panel  3. Gastroesophageal reflux disease, esophagitis presence not specified -He has no complaints with his stomach today. - Hepatic function panel  4. Pure hypercholesterolemia -Continue with aggressive therapeutic lifestyle changes pending results of lab work - Lipid panel  5. Benign prostatic hyperplasia, unspecified whether lower urinary tract symptoms present - PSA, total and free - Urinalysis, Complete  6. Other fatigue - VITAMIN D 25 Hydroxy (Vit-D Deficiency, Fractures) - Sedimentation rate - Lyme Ab/Western Blot Reflex - Rocky mtn spotted fvr abs pnl(IgG+IgM)  7. Basal cell carcinoma of skin of other part of face -Patient seen by the dermatologist regularly.  8. Chronic fatigue - Ambulatory referral to Pulmonology  9. Obstructive sleep apnea - Ambulatory referral to Pulmonology  10. Statin intolerance -Continue with aggressive  therapeutic lifestyle changes  Meds ordered this encounter  Medications  . finasteride (PROPECIA) 1 MG tablet    Sig: TAKE ONE (1) TABLET EACH DAY    Dispense:  90 tablet    Refill:  3  . sildenafil (VIAGRA) 100 MG tablet    Sig: TAKE AS DIRECTED    Dispense:  12 tablet    Refill:  5  . ezetimibe (ZETIA) 10 MG tablet    Sig: TAKE ONE (1) TABLET EACH DAY    Dispense:  30 tablet    Refill:  11  . omega-3 acid ethyl esters (LOVAZA) 1 g capsule    Sig: Take 4 capsules (4 g total) by mouth daily.    Dispense:  120 capsule    Refill:  11  . fluticasone (FLONASE) 50 MCG/ACT nasal spray    Sig: Place 1 spray into both nostrils daily.    Dispense:  16 g    Refill:  11  . FLUoxetine (PROZAC) 20 MG capsule    Sig: TAKE 2 CAPSULES BY MOUTH EVERY MORNING AND 1 CAPSULE AT NIGHT    Dispense:  90 capsule    Refill:  11  . amLODipine (NORVASC)  5 MG tablet    Sig: Take 1.5 tablets (7.5 mg total) by mouth daily.    Dispense:  45 tablet    Refill:  11   Patient Instructions  Continue current medications. Continue good therapeutic lifestyle changes which include good diet and exercise. Fall precautions discussed with patient. If an FOBT was given today- please return it to our front desk. If you are over 31 years old - you may need Prevnar 20 or the adult Pneumonia vaccine.  **Flu shots are available--- please call and schedule a FLU-CLINIC appointment**  After your visit with [place] today you will receive a survey in the mail or online from [employer] regarding your care with [place]. Please take a moment to fill this out. Your feedback is very important to [place] as you can help [place] better understand your patient needs as well as improve your experience and satisfaction. WE CARE ABOUT YOU!!!  The patient had a stress test in March 2015 and I would recommend getting another one sometime in the next 6-12 months. This is just for routine purposes. We will schedule him to be reevaluated for sleep apnea as he has not had a reevaluation since the 90s. We will get lab work and a sedimentation rate and we will see him again in about 4 weeks to see where we are with everything at that time. Because of his dizziness we may consider getting a 24-hour blood pressure monitor and order a [employer] Pace [employer] Hearts monitor. He will try to reduce his intake of alcohol at night. If he continues with the pain going down the right leg and it appears to be positional in nature he may need to check back with his orthopedic surgeon at [facility] for further evaluation.   Arrie Senate MD

## 2016-02-04 NOTE — Addendum Note (Signed)
Addended by: Zannie Cove on: 02/04/2016 05:16 PM   Modules accepted: Orders

## 2016-02-04 NOTE — Patient Instructions (Addendum)
Continue current medications. Continue good therapeutic lifestyle changes which include good diet and exercise. Fall precautions discussed with patient. If an FOBT was given today- please return it to our front desk. If you are over 67 years old - you may need Prevnar 42 or the adult Pneumonia vaccine.  **Flu shots are available--- please call and schedule a FLU-CLINIC appointment**  After your visit with [place] today you will receive a survey in the mail or online from [employer] regarding your care with [place]. Please take a moment to fill this out. Your feedback is very important to [place] as you can help [place] better understand your patient needs as well as improve your experience and satisfaction. WE CARE ABOUT YOU!!!  The patient had a stress test in March 2015 and I would recommend getting another one sometime in the next 6-12 months. This is just for routine purposes. We will schedule him to be reevaluated for sleep apnea as he has not had a reevaluation since the 90s. We will get lab work and a sedimentation rate and we will see him again in about 4 weeks to see where we are with everything at that time. Because of his dizziness we may consider getting a 24-hour blood pressure monitor and order a [employer] Pace of Hearts monitor. He will try to reduce his intake of alcohol at night. If he continues with the pain going down the right leg and it appears to be positional in nature he may need to check back with his orthopedic surgeon at [facility] for further evaluation.

## 2016-02-08 ENCOUNTER — Other Ambulatory Visit: Payer: Self-pay | Admitting: Family Medicine

## 2016-02-08 ENCOUNTER — Telehealth: Payer: Self-pay | Admitting: Family Medicine

## 2016-02-08 NOTE — Telephone Encounter (Signed)
Medication sent to pharmacy  

## 2016-02-09 ENCOUNTER — Telehealth: Payer: Self-pay | Admitting: Family Medicine

## 2016-02-09 ENCOUNTER — Other Ambulatory Visit: Payer: Self-pay | Admitting: *Deleted

## 2016-02-09 LAB — ROCKY MTN SPOTTED FVR ABS PNL(IGG+IGM)
RMSF IGG: POSITIVE — AB
RMSF IGM: 0.46 {index} (ref 0.00–0.89)

## 2016-02-09 LAB — CBC WITH DIFFERENTIAL/PLATELET
Basophils Absolute: 0 10*3/uL (ref 0.0–0.2)
Basos: 1 %
EOS (ABSOLUTE): 0.2 10*3/uL (ref 0.0–0.4)
EOS: 2 %
HEMATOCRIT: 45.3 % (ref 37.5–51.0)
HEMOGLOBIN: 15.5 g/dL (ref 13.0–17.7)
Immature Grans (Abs): 0 10*3/uL (ref 0.0–0.1)
Immature Granulocytes: 0 %
LYMPHS ABS: 2.1 10*3/uL (ref 0.7–3.1)
Lymphs: 34 %
MCH: 30.8 pg (ref 26.6–33.0)
MCHC: 34.2 g/dL (ref 31.5–35.7)
MCV: 90 fL (ref 79–97)
MONOCYTES: 9 %
Monocytes Absolute: 0.6 10*3/uL (ref 0.1–0.9)
NEUTROS ABS: 3.4 10*3/uL (ref 1.4–7.0)
Neutrophils: 54 %
Platelets: 173 10*3/uL (ref 150–379)
RBC: 5.04 x10E6/uL (ref 4.14–5.80)
RDW: 13.7 % (ref 12.3–15.4)
WBC: 6.2 10*3/uL (ref 3.4–10.8)

## 2016-02-09 LAB — LIPID PANEL
CHOLESTEROL TOTAL: 180 mg/dL (ref 100–199)
Chol/HDL Ratio: 3.5 ratio units (ref 0.0–5.0)
HDL: 51 mg/dL (ref >39)
LDL CALC: 115 mg/dL — AB (ref 0–99)
Triglycerides: 69 mg/dL (ref 0–149)
VLDL Cholesterol Cal: 14 mg/dL (ref 5–40)

## 2016-02-09 LAB — HEPATIC FUNCTION PANEL
ALBUMIN: 4.3 g/dL (ref 3.6–4.8)
ALK PHOS: 50 IU/L (ref 39–117)
ALT: 25 IU/L (ref 0–44)
AST: 20 IU/L (ref 0–40)
BILIRUBIN, DIRECT: 0.12 mg/dL (ref 0.00–0.40)
Bilirubin Total: 0.4 mg/dL (ref 0.0–1.2)
Total Protein: 6.7 g/dL (ref 6.0–8.5)

## 2016-02-09 LAB — BMP8+EGFR
BUN / CREAT RATIO: 24 (ref 10–24)
BUN: 17 mg/dL (ref 8–27)
CO2: 22 mmol/L (ref 18–29)
CREATININE: 0.71 mg/dL — AB (ref 0.76–1.27)
Calcium: 9.1 mg/dL (ref 8.6–10.2)
Chloride: 102 mmol/L (ref 96–106)
GFR, EST AFRICAN AMERICAN: 112 mL/min/{1.73_m2} (ref >59)
GFR, EST NON AFRICAN AMERICAN: 97 mL/min/{1.73_m2} (ref >59)
Glucose: 98 mg/dL (ref 65–99)
Potassium: 4.1 mmol/L (ref 3.5–5.2)
Sodium: 140 mmol/L (ref 134–144)

## 2016-02-09 LAB — SEDIMENTATION RATE: Sed Rate: 3 mm/hr (ref 0–30)

## 2016-02-09 LAB — LYME AB/WESTERN BLOT REFLEX: Lyme IgG/IgM Ab: <0.91 {ISR} (ref 0.00–0.90)

## 2016-02-09 LAB — THYROID PANEL WITH TSH
Free Thyroxine Index: 1.6 (ref 1.2–4.9)
T3 UPTAKE RATIO: 27 % (ref 24–39)
T4, Total: 6 ug/dL (ref 4.5–12.0)
TSH: 2.14 u[IU]/mL (ref 0.450–4.500)

## 2016-02-09 LAB — PSA, TOTAL AND FREE
PROSTATE SPECIFIC AG, SERUM: 0.6 ng/mL (ref 0.0–4.0)
PSA FREE PCT: 33.3 %
PSA FREE: 0.2 ng/mL

## 2016-02-09 LAB — RMSF, IGG, IFA: RMSF, IGG, IFA: 1:64 {titer} — ABNORMAL HIGH

## 2016-02-09 LAB — VITAMIN D 25 HYDROXY (VIT D DEFICIENCY, FRACTURES): Vit D, 25-Hydroxy: 51.1 ng/mL (ref 30.0–100.0)

## 2016-02-09 MED ORDER — ROSUVASTATIN CALCIUM 5 MG PO TABS: 2.5000 mg | ORAL_TABLET | Freq: Every day | ORAL | 1 refills | Status: DC

## 2016-02-09 MED ORDER — DOXYCYCLINE HYCLATE 100 MG PO TABS: 100.0000 mg | ORAL_TABLET | Freq: Two times a day (BID) | ORAL | 0 refills | Status: DC

## 2016-02-09 NOTE — Addendum Note (Signed)
Addended by: Shelbie Ammons on: 02/09/2016 11:08 AM   Modules accepted: Orders

## 2016-02-09 NOTE — Telephone Encounter (Signed)
Aware of results. 

## 2016-02-17 ENCOUNTER — Ambulatory Visit (INDEPENDENT_AMBULATORY_CARE_PROVIDER_SITE_OTHER): Payer: BLUE CROSS/BLUE SHIELD | Admitting: Internal Medicine

## 2016-02-17 ENCOUNTER — Encounter: Payer: Self-pay | Admitting: Internal Medicine

## 2016-02-17 VITALS — BP 122/76 | HR 76 | Ht 72.0 in | Wt 202.4 lb

## 2016-02-17 DIAGNOSIS — J3089 Other allergic rhinitis: Secondary | ICD-10-CM | POA: Diagnosis not present

## 2016-02-17 DIAGNOSIS — G4733 Obstructive sleep apnea (adult) (pediatric): Secondary | ICD-10-CM

## 2016-02-17 DIAGNOSIS — J302 Other seasonal allergic rhinitis: Secondary | ICD-10-CM | POA: Diagnosis not present

## 2016-02-17 NOTE — Assessment & Plan Note (Signed)
Generally Flonase has provided adequate control and has been well-tolerated. He will continue this for now.

## 2016-02-17 NOTE — Assessment & Plan Note (Signed)
He has successfully used CPAP for many years without a break in continuity. Current machine about 68 years old. When it is due to be replaced he may be on Medicare and at that point likely would be required to have replacement sleep study to update. For now he can continue his current machine. We need to get [employer] to help with obtaining download for documentation. We discussed sleep habits, appropriate use of caffeine and naps, driving responsibility, alternative mask designs.

## 2016-02-17 NOTE — Progress Notes (Addendum)
02/17/16-66 year old male smoker referred by Dr Laurance Flatten for sleep medicine evaluation with history of obstructive sleep apnea.. Remote sleep study in the 1990s in [facility], treated with CPAP but no active follow-up. Epworth Score 5/24 Medical history of hypertension, osteoarthritis with arthroplasties Dr Laurance Flatten; Sleep Study about 20 years ago; DME [facility]-currently wears CPAP machine. [facility] with current machine about 68 years old, no break in therapy. Using a nasal pillows mask which he adjusts for comfort. Sleeps better and feels better with CPAP. Usual bedtime 1 AM, sleep latency 10-15 minutes, rarely wakes at night, up 8 or 9 AM. Always a slow starter in the mornings, better after 2 or 3 cups of coffee. Rare daytime sleepiness. No routine sleep medicines. ENT surgery-septoplasty. Seasonal rhinitis controlled with Flonase. He denies problems with thyroid, lung or heart. Quit cigarettes 30 years ago, smokes occasional cigar. 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  Prior to Admission medications   Medication Sig Start Date End Date Taking? Authorizing Provider  amLODipine (NORVASC) 5 MG tablet Take 1.5 tablets (7.5 mg total) by mouth daily. 02/04/16  Yes Chipper Herb, MD  aspirin 81 MG EC tablet Take 81 mg by mouth daily after supper.     Yes Historical Provider, MD  Cholecalciferol (VITAMIN D3) 1000 UNITS CAPS Take 1 capsule by mouth daily.     Yes Historical Provider, MD  doxycycline (VIBRA-TABS) 100 MG tablet Take 1 tablet (100 mg total) by mouth 2 (two) times daily. 02/09/16  Yes Chipper Herb, MD  ezetimibe (ZETIA) 10 MG tablet TAKE ONE (1) TABLET EACH DAY 02/04/16  Yes Chipper Herb, MD  finasteride (PROPECIA) 1 MG tablet TAKE ONE (1) TABLET EACH DAY 02/04/16  Yes Chipper Herb, MD  FLUoxetine (PROZAC) 20 MG capsule TAKE 2 CAPSULES BY MOUTH EVERY MORNING AND 1 CAPSULE AT NIGHT 02/04/16  Yes Chipper Herb, MD  fluticasone [facility]) 50 MCG/ACT  nasal spray Place 1 spray into both nostrils daily. 02/04/16  Yes Chipper Herb, MD  ibuprofen (ADVIL,MOTRIN) 800 MG tablet TAKE ONE TABLET 3 TIMES A DAY AS NEEDED. 11/30/15  Yes Chipper Herb, MD  omega-3 acid ethyl esters (LOVAZA) 1 g capsule Take 4 capsules (4 g total) by mouth daily. 02/04/16  Yes Chipper Herb, MD  VIAGRA 100 MG tablet TAKE AS DIRECTED 10/05/15  Yes Chipper Herb, MD  rosuvastatin (CRESTOR) 5 MG tablet Take 0.5 tablets (2.5 mg total) by mouth daily. at bedtime Patient not taking: Reported on 02/17/2016 02/09/16   Chipper Herb, MD   Past Medical History:  Diagnosis Date  . BCC (basal cell carcinoma), face   . Degeneration of intervertebral disc, site unspecified   . Depressive disorder, not elsewhere classified   . Esophageal stricture   . Essential hypertension, benign   . GERD (gastroesophageal reflux disease)   . Hiatal hernia   . Hyperplasia of prostate   . Osteoarthrosis and allied disorders   . Sleep apnea    Past Surgical History:  Procedure Laterality Date  . APPENDECTOMY  [id number]  . JOINT REPLACEMENT    . Left Hip REplaacement  01-1999  . LUMBAR FUSION  05-1998  . NASAL SEPTUM SURGERY  1989  . Right Hip Replacement  09-2002  . SPINE SURGERY    . VENTRAL HERNIA REPAIR  02-2000   Family History  Problem Relation Age of Onset  . Lymphoma Mother   . Arthritis Mother   . Asthma Mother   .  Kidney failure Father   . Hypertension Father   . Hypertension Brother   . Hyperlipidemia Brother   . Bipolar disorder Brother    Social History   Social History  . Marital status: Married    Spouse name: N/A  . Number of children: N/A  . Years of education: N/A   Occupational History  . Not on file.   Social History Main Topics  . Smoking status: Current Some Day Smoker    Types: Cigars  . Smokeless tobacco: Never Used  . Alcohol use Yes     Comment: mix  . Drug use: No  . Sexual activity: Yes   Other Topics Concern  . Not on file   Social  History Narrative  . No narrative on file   ROS-see HPI   Negative unless "+" Constitutional:    weight loss, night sweats, fevers, chills, +fatigue, lassitude. HEENT:    headaches, difficulty swallowing, tooth/dental problems, sore throat,      + sneezing, +itching, ear ache, +nasal congestion, post nasal drip, snoring CV:    chest pain, orthopnea, PND, swelling in lower extremities, anasarca,                                                        dizziness, palpitations Resp:   +shortness of breath with exertion or at rest.                productive cough,   non-productive cough, coughing up of blood.              change in color of mucus.  wheezing.   Skin:    rash or lesions. GI:  No-   heartburn, indigestion, abdominal pain, nausea, vomiting, diarrhea,                 change in bowel habits, loss of appetite GU: dysuria, change in color of urine, no urgency or frequency.   flank pain. MS:   +joint pain, stiffness, decreased range of motion, back pain. Neuro-     nothing unusual Psych:  change in mood or affect.  depression or anxiety.   memory loss.  OBJ- Physical Exam General- Alert, Oriented, Affect-appropriate, Distress- none acute Skin- rash-none, lesions- none, excoriation- none Lymphadenopathy- none Head- atraumatic            Eyes- Gross vision intact, PERRLA, conjunctivae and secretions clear            Ears- Hearing, canals-normal            Nose- Clear, no-Septal dev, mucus, polyps, erosion, perforation + old external nasal trauma            Throat- Mallampati II , mucosa clear , drainage- none, tonsils- atrophic Neck- flexible , trachea midline, no stridor , thyroid nl, carotid no bruit, own teeth Chest - symmetrical excursion , unlabored           Heart/CV- RRR , no murmur , no gallop  , no rub, nl s1 s2                           - JVD- none , edema- none, stasis changes- none, varices- none           Lung- clear to P&A, wheeze- none,  cough- none , dullness-none,  rub- none           Chest wall-  Abd-  Br/ Gen/ Rectal- Not done, not indicated Extrem- cyanosis- none, clubbing, none, atrophy- none, strength- nl Neuro- grossly intact to observation

## 2016-02-17 NOTE — Patient Instructions (Addendum)
Order- [facility]- Please download CPAP for pressure  Compliance. Provide SD card or AirView. Ok to continue current pressure, mask of choice, humidifier, supplies.   Dx OSA   Ok to take an occasional nap or a little extra caffeine if needed to stay safe   Please call as needed

## 2016-03-03 ENCOUNTER — Encounter: Payer: Self-pay | Admitting: Family Medicine

## 2016-03-03 ENCOUNTER — Ambulatory Visit (INDEPENDENT_AMBULATORY_CARE_PROVIDER_SITE_OTHER): Payer: BLUE CROSS/BLUE SHIELD | Admitting: Family Medicine

## 2016-03-03 VITALS — BP 124/76 | HR 68 | Temp 97.7°F | Ht 72.0 in | Wt 207.0 lb

## 2016-03-03 DIAGNOSIS — R3121 Asymptomatic microscopic hematuria: Secondary | ICD-10-CM

## 2016-03-03 DIAGNOSIS — A770 Spotted fever due to Rickettsia rickettsii: Secondary | ICD-10-CM

## 2016-03-03 DIAGNOSIS — E7800 Pure hypercholesterolemia, unspecified: Secondary | ICD-10-CM | POA: Diagnosis not present

## 2016-03-03 DIAGNOSIS — M150 Primary generalized (osteo)arthritis: Secondary | ICD-10-CM | POA: Diagnosis not present

## 2016-03-03 DIAGNOSIS — G4733 Obstructive sleep apnea (adult) (pediatric): Secondary | ICD-10-CM | POA: Diagnosis not present

## 2016-03-03 DIAGNOSIS — M159 Polyosteoarthritis, unspecified: Secondary | ICD-10-CM

## 2016-03-03 DIAGNOSIS — R319 Hematuria, unspecified: Secondary | ICD-10-CM

## 2016-03-03 DIAGNOSIS — Z789 Other specified health status: Secondary | ICD-10-CM

## 2016-03-03 LAB — URINALYSIS, COMPLETE
BILIRUBIN UA: NEGATIVE
Glucose, UA: NEGATIVE
Ketones, UA: NEGATIVE
LEUKOCYTES UA: NEGATIVE
Nitrite, UA: NEGATIVE
PH UA: 6.5 (ref 5.0–7.5)
PROTEIN UA: NEGATIVE
RBC, UA: NEGATIVE
Specific Gravity, UA: 1.015 (ref 1.005–1.030)
Urobilinogen, Ur: 0.2 mg/dL (ref 0.2–1.0)

## 2016-03-03 LAB — MICROSCOPIC EXAMINATION
Bacteria, UA: NONE SEEN
RENAL EPITHEL UA: NONE SEEN /HPF
WBC, UA: NONE SEEN /hpf (ref 0–?)

## 2016-03-03 NOTE — Progress Notes (Signed)
Subjective:    Patient ID: Melvin [name], male    DOB: [birth year], 66 y.o.   MRN: [id number]  HPI Patient here today for 3-4 week follow up on fatigue.Since the last visit he is been to see the pulmonologist who did not change his machine for sleep apnea and Continued with the same settings but did recommend that he may want to upgrade to a different machine when he turns 63. As a result of the testing that was done his sedimentation rate was good. The vitamin D level was good. The LDL C cholesterol was slightly elevated at 1:15. The PSA was good. The thyroid tests were normal. The sedimentation rate was normal. Liver function tests were normal. The Lyme disease tests were negative. The [place] spotted fever higher came back indicating the possibility of recent or active infection and he was treated with antibiotics because of this. The patient says he is feeling. The patient denies any chest pain or shortness of breath or any GI symptoms or urinary tract symptoms. His recent lab work was reviewed with him. The LDL C cholesterol was up slightly. He hasn't started the Crestor yet. The urinalysis also had 3-10 red blood cells and we will repeat the urinalysis today before he leaves office.    Patient Active Problem List   Diagnosis Date Noted  . Seasonal and perennial allergic rhinitis 02/17/2016  . Erectile dysfunction 04/15/2013  . Statin intolerance 04/15/2013  . Hyperlipidemia 04/15/2013  . BCC (basal cell carcinoma), face   . Essential hypertension, benign   . Hiatal hernia with gastroesophageal reflux    . Degeneration of intervertebral disc, site unspecified   . Esophageal stricture, history of   . Obstructive sleep apnea   . Depression   . Osteoarthritis   . BPH (benign prostatic hyperplasia)    Outpatient Encounter Prescriptions as of 03/03/2016  Medication Sig  . amLODipine (NORVASC) 5 MG tablet Take 1.5 tablets (7.5 mg total) by mouth daily.  Marland Kitchen aspirin 81 MG EC tablet Take  81 mg by mouth daily after supper.    . Cholecalciferol (VITAMIN D3) 1000 UNITS CAPS Take 1 capsule by mouth daily.    Marland Kitchen ezetimibe (ZETIA) 10 MG tablet TAKE ONE (1) TABLET EACH DAY  . finasteride (PROPECIA) 1 MG tablet TAKE ONE (1) TABLET EACH DAY  . FLUoxetine (PROZAC) 20 MG capsule TAKE 2 CAPSULES BY MOUTH EVERY MORNING AND 1 CAPSULE AT NIGHT  . fluticasone (FLONASE) 50 MCG/ACT nasal spray Place 1 spray into both nostrils daily.  Marland Kitchen ibuprofen (ADVIL,MOTRIN) 800 MG tablet TAKE ONE TABLET 3 TIMES A DAY AS NEEDED.  Marland Kitchen omega-3 acid ethyl esters (LOVAZA) 1 g capsule Take 4 capsules (4 g total) by mouth daily.  Marland Kitchen VIAGRA 100 MG tablet TAKE AS DIRECTED  . [DISCONTINUED] doxycycline (VIBRA-TABS) 100 MG tablet Take 1 tablet (100 mg total) by mouth 2 (two) times daily.  . rosuvastatin (CRESTOR) 5 MG tablet Take 0.5 tablets (2.5 mg total) by mouth daily. at bedtime (Patient not taking: Reported on 03/03/2016)   No facility-administered encounter medications on file as of 03/03/2016.       Review of Systems  Constitutional: Negative.   HENT: Negative.   Eyes: Negative.   Respiratory: Negative.   Cardiovascular: Negative.   Gastrointestinal: Negative.   Endocrine: Negative.   Genitourinary: Negative.   Musculoskeletal: Negative.   Skin: Negative.   Allergic/Immunologic: Negative.   Neurological: Negative.   Hematological: Negative.   Psychiatric/Behavioral: Negative.  Objective:   Physical Exam  Constitutional: He is oriented to person, place, and time. He appears well-developed and well-nourished. No distress.  HENT:  Head: Normocephalic and atraumatic.  Eyes: Conjunctivae and EOM are normal. Pupils are equal, round, and reactive to light. Right eye exhibits no discharge. Left eye exhibits no discharge. No scleral icterus.  Neck: Normal range of motion.  Cardiovascular: Normal rate, regular rhythm and normal heart sounds.   No murmur heard. Pulmonary/Chest: Effort normal and breath  sounds normal. No respiratory distress. He has no wheezes. He has no rales.  Abdominal: Soft. Bowel sounds are normal. He exhibits no mass. There is no tenderness. There is no rebound and no guarding.  Musculoskeletal: Normal range of motion. He exhibits no edema.  Neurological: He is alert and oriented to person, place, and time. No cranial nerve deficit.  Skin: Skin is warm and dry. No rash noted.  Psychiatric: He has a normal mood and affect. His behavior is normal. Judgment and thought content normal.  Nursing note and vitals reviewed.   BP 124/76 (BP Location: Left Arm)   Pulse 68   Temp 97.7 F (36.5 C) (Oral)   Ht 6' (1.829 m)   Wt 207 lb (93.9 kg)   BMI 28.07 kg/m        Assessment & Plan:  1. Statin intolerance -The patient will try Crestor one half tablet on Monday Wednesday and Friday evenings to see if we can get the cholesterol down lower. He will continue with aggressive therapeutic lifestyle changes  2. Obstructive sleep apnea -He has seen the pulmonologist and is trying to get a new machine for his sleep apnea.  3. Asymptomatic microscopic hematuria -Check urinalysis today  4. [facility] spotted fever -The patient has taken a 2 week course of doxycycline and he says he feels better. He does not recall having a tick bite.  5. Pure hypercholesterolemia -Continue aggressive therapeutic lifestyle changes and Crestor as directed  6. Primary osteoarthritis involving multiple joints -The sedimentation rate was low. The patient will consider getting a trainer and starting an exercise program again.  7. Hematuria, unspecified type - Urine culture - Urinalysis, Complete  Patient Instructions  Start Crestor and take as directed-----discontinue if any muscle aches or myalgia We will call with results of urinalysis once that report has been generated Continue to drink plenty of fluids Stay well hydrated Try to exercise regularly and consider calling trainer to  get you restarted on your exercise program Reduce alcohol intake as much as possible   Arrie Senate MD

## 2016-03-03 NOTE — Patient Instructions (Signed)
Start Crestor and take as directed-----discontinue if any muscle aches or myalgia We will call with results of urinalysis once that report has been generated Continue to drink plenty of fluids Stay well hydrated Try to exercise regularly and consider calling trainer to get you restarted on your exercise program Reduce alcohol intake as much as possible

## 2016-03-05 LAB — URINE CULTURE: ORGANISM ID, BACTERIA: NO GROWTH

## 2016-03-06 ENCOUNTER — Other Ambulatory Visit: Payer: Self-pay | Admitting: *Deleted

## 2016-03-06 ENCOUNTER — Telehealth: Payer: Self-pay | Admitting: Family Medicine

## 2016-03-06 DIAGNOSIS — R319 Hematuria, unspecified: Secondary | ICD-10-CM

## 2016-03-06 NOTE — Telephone Encounter (Signed)
Called again.......

## 2016-03-31 ENCOUNTER — Encounter: Payer: Self-pay | Admitting: Family

## 2016-03-31 ENCOUNTER — Telehealth: Payer: Self-pay | Admitting: Family Medicine

## 2016-03-31 ENCOUNTER — Ambulatory Visit (INDEPENDENT_AMBULATORY_CARE_PROVIDER_SITE_OTHER): Payer: BLUE CROSS/BLUE SHIELD | Admitting: Family

## 2016-03-31 VITALS — BP 139/84 | HR 78 | Temp 97.7°F | Ht 72.0 in | Wt 205.0 lb

## 2016-03-31 DIAGNOSIS — H10502 Unspecified blepharoconjunctivitis, left eye: Secondary | ICD-10-CM

## 2016-03-31 MED ORDER — TOBRAMYCIN 0.3 % OP SOLN: 1.0000 [drp] | OPHTHALMIC | 0 refills | Status: DC

## 2016-03-31 NOTE — Progress Notes (Signed)
   Subjective:    Patient ID: Melvin [name], male    DOB: 1950/06/01, 66 y.o.   MRN: [id number]    HPI  Pt presents to the office today with recurrent left eye irration. Pt states she has a history off allergies and several times a year it becomes "cloudy"  and itchy. Pt states he had some tobramycin oph drops left that he used that was helping, but "ran out".   Review of Systems  Eyes: Positive for redness and itching.  All other systems reviewed and are negative.      Objective:   Physical Exam  Constitutional: He is oriented to person, place, and time. He appears well-developed and well-nourished.  Eyes: Left eye exhibits discharge and exudate.  Cardiovascular: Normal rate, regular rhythm, normal heart sounds and intact distal pulses.   Pulmonary/Chest: Effort normal and breath sounds normal.  Musculoskeletal: Normal range of motion.  Neurological: He is alert and oriented to person, place, and time.  Skin: Skin is warm and dry.  Psychiatric: He has a normal mood and affect. His behavior is normal. Judgment and thought content normal.   BP 139/84   Pulse 78   Temp 97.7 F (36.5 C) (Oral)   Ht 6' (1.829 m)   Wt 205 lb (93 kg)   BMI 27.80 kg/m       Assessment & Plan:  1. Blepharoconjunctivitis of left eye, unspecified blepharoconjunctivitis type Do not rub eyes Warm compresses Good hand hygiene discussed RTO prn  - tobramycin (TOBREX) 0.3 % ophthalmic solution; Place 1 drop into the left eye every 4 (four) hours.  Dispense: 5 mL; Refill: 0   Evelina Dun, FNP

## 2016-03-31 NOTE — Telephone Encounter (Signed)
Pt requesting refill on Tobramycin appt scheduled

## 2016-03-31 NOTE — Telephone Encounter (Signed)
What is the name of the medication? Tovormycin, eye drop. orignally given by eye doctor  Have you contacted your pharmacy to request a refill? no  Which pharmacy would you like this sent to? [facility] in [place].    Patient notified that their request is being sent to the clinical staff for review and that they should receive a call once it is complete. If they do not receive a call within 24 hours they can check with their pharmacy or our office.

## 2016-03-31 NOTE — Patient Instructions (Signed)
Blepharitis Introduction Blepharitis is inflammation of the eyelids. Blepharitis may happen with:  Reddish, scaly skin around the scalp and eyebrows.  Burning or itching of the eyelids.  Eye discharge at night that causes the eyelashes to stick together in the morning.  Eyelashes that fall out.  Sensitivity to light. Follow these instructions at home: Pay attention to any changes in how you look or feel. Follow these instructions to help with your condition: Keeping Clean  Wash your hands often.  Wash your eyelids with warm water or with warm water that is mixed with a small amount of baby shampoo. Do this two times per day or as often as needed.  Wash your face and eyebrows at least once a day.  Use a clean towel each time you dry your eyelids. Do not use this towel to clean or dry other areas of your body. Do not share your towel with anyone. General instructions  Avoid wearing makeup until you get better. Do not share makeup with anyone.  Avoid rubbing your eyes.  Apply warm compresses to your eyes 2 times per day for 10 minutes at a time, or as told by your health care provider.  If you were prescribed an antibiotic ointment or steroid drops, apply or use the medicine as told by your health care provider. Do not stop using the medicine even if you feel better.  Keep all follow-up visits as told by your health care provider. This is important. Contact a health care provider if:  Your eyelids feel hot.  You have blisters or a rash on your eyelids.  The condition does not go away in 2-4 days.  The inflammation gets worse. Get help right away if:  You have pain or redness that gets worse or spreads to other parts of your face.  Your vision changes.  You have pain when looking at lights or moving objects.  You have a fever. This information is not intended to replace advice given to you by your health care provider. Make sure you discuss any questions you have with  your health care provider. Document Released: 01/21/2000 Document Revised: 07/01/2015 Document Reviewed: 05/18/2014  2017 Elsevier

## 2016-04-04 ENCOUNTER — Other Ambulatory Visit: Payer: Self-pay | Admitting: *Deleted

## 2016-04-04 MED ORDER — FLUOXETINE HCL 20 MG PO CAPS: ORAL_CAPSULE | ORAL | 11 refills | Status: DC

## 2016-04-10 ENCOUNTER — Other Ambulatory Visit: Payer: Self-pay | Admitting: *Deleted

## 2016-04-10 MED ORDER — FLUOXETINE HCL 20 MG PO CAPS: ORAL_CAPSULE | ORAL | 0 refills | Status: DC

## 2016-04-26 ENCOUNTER — Other Ambulatory Visit: Payer: Self-pay | Admitting: Family Medicine

## 2016-05-08 ENCOUNTER — Telehealth: Payer: Self-pay | Admitting: Family Medicine

## 2016-05-08 MED ORDER — FLUOXETINE HCL 20 MG PO CAPS: ORAL_CAPSULE | ORAL | 0 refills | Status: DC

## 2016-05-08 MED ORDER — SILDENAFIL CITRATE 100 MG PO TABS: ORAL_TABLET | ORAL | 4 refills | Status: DC

## 2016-05-08 NOTE — Telephone Encounter (Signed)
What is the name of the medication? Fluoxentine 20 mg and Viagra 100 mg  Have you contacted your pharmacy to request a refill? yes  Which pharmacy would you like this sent to? [facility] in [place]   Patient notified that their request is being sent to the clinical staff for review and that they should receive a call once it is complete. If they do not receive a call within 24 hours they can check with their pharmacy or our office.

## 2016-05-08 NOTE — Telephone Encounter (Signed)
Pt aware refills sent to [facility] Pt had one 90 day supply of Fluoxentine at [facility] Only one more 90 day supply of [facility]ine [facility] to [facility]

## 2016-05-08 NOTE — Telephone Encounter (Signed)
[place] pt

## 2016-05-08 NOTE — Telephone Encounter (Signed)
Okay at current level for 6 mos. Thanks ws 

## 2016-06-07 DIAGNOSIS — R3129 Other microscopic hematuria: Secondary | ICD-10-CM | POA: Insufficient documentation

## 2016-08-29 ENCOUNTER — Other Ambulatory Visit: Payer: Self-pay | Admitting: Family Medicine

## 2016-08-30 NOTE — Telephone Encounter (Signed)
Last lipid 02/04/16  DW[facility] 

## 2016-11-13 ENCOUNTER — Other Ambulatory Visit: Payer: Self-pay | Admitting: Family Medicine

## 2016-12-03 ENCOUNTER — Other Ambulatory Visit: Payer: Self-pay | Admitting: Family Medicine

## 2016-12-04 NOTE — Telephone Encounter (Signed)
Last lipid 02/04/16  DW[facility]

## 2016-12-04 NOTE — Telephone Encounter (Signed)
Last refill without being seen 

## 2016-12-18 ENCOUNTER — Ambulatory Visit (INDEPENDENT_AMBULATORY_CARE_PROVIDER_SITE_OTHER): Payer: BLUE CROSS/BLUE SHIELD | Admitting: *Deleted

## 2016-12-18 DIAGNOSIS — Z23 Encounter for immunization: Secondary | ICD-10-CM | POA: Diagnosis not present

## 2017-01-12 ENCOUNTER — Ambulatory Visit (INDEPENDENT_AMBULATORY_CARE_PROVIDER_SITE_OTHER): Payer: BLUE CROSS/BLUE SHIELD | Admitting: Family Medicine

## 2017-01-12 VITALS — BP 159/82 | HR 89 | Temp 99.4°F | Ht 72.0 in | Wt 205.0 lb

## 2017-01-12 DIAGNOSIS — R5383 Other fatigue: Secondary | ICD-10-CM | POA: Diagnosis not present

## 2017-01-12 DIAGNOSIS — M25511 Pain in right shoulder: Secondary | ICD-10-CM | POA: Diagnosis not present

## 2017-01-12 DIAGNOSIS — M791 Myalgia, unspecified site: Secondary | ICD-10-CM | POA: Diagnosis not present

## 2017-01-12 LAB — VERITOR FLU A/B WAIVED
INFLUENZA A: NEGATIVE
Influenza B: NEGATIVE

## 2017-01-12 NOTE — Patient Instructions (Signed)
Your influenza test was negative.  You still may have a virus causing you symptoms however, we will check for other possible etiologies.  You had labs performed today.  You will be contacted with the results of the labs once they are available, usually in the next 3 days for routine lab work.  Continue with ibuprofen as you have been every 8 hours if needed for aches.  Make sure that you are drinking plenty of water.  Continue to stay active within reason.   Muscle Pain, Adult Muscle pain (myalgia) may be mild or severe. In most cases, the pain lasts only a short time and it goes away without treatment. It is normal to feel some muscle pain after starting a workout program. Muscles that have not been used often will be sore at first. Muscle pain may also be caused by many other things, including:  Overuse or muscle strain, especially if you are not in shape. This is the most common cause of muscle pain.  Injury.  Bruises.  Viruses, such as the flu.  Infectious diseases.  A chronic condition that causes muscle tenderness, fatigue, and headache (fibromyalgia).  A condition, such as lupus, in which the body's disease-fighting system attacks other organs in the body (autoimmune or rheumatologic diseases).  Certain drugs, including ACE inhibitors and statins.  To diagnose the cause of your muscle pain, your health care provider will do a physical exam and ask questions about the pain and when it began. If you have not had muscle pain for very long, your health care provider may want to wait before doing much testing. If your muscle pain has lasted a long time, your health care provider may want to run tests right away. In some cases, this may include tests to rule out certain conditions or illnesses. Treatment for muscle pain depends on the cause. Home care is often enough to relieve muscle pain. Your health care provider may also prescribe anti-inflammatory medicine. Follow these  instructions at home: Activity  If overuse is causing your muscle pain: ? Slow down your activities until the pain goes away. ? Do regular, gentle exercises if you are not usually active. ? Warm up before exercising. Stretch before and after exercising. This can help lower the risk of muscle pain.  Do not continue working out if the pain is very bad. Bad pain could mean that you have injured a muscle. Managing pain and discomfort   If directed, apply ice to the sore muscle: ? Put ice in a plastic bag. ? Place a towel between your skin and the bag. ? Leave the ice on for 20 minutes, 2-3 times a day.  You may also alternate between applying ice and applying heat as told by your health care provider. To apply heat, use the heat source that your health care provider recommends, such as a moist heat pack or a heating pad. ? Place a towel between your skin and the heat source. ? Leave the heat on for 20-30 minutes. ? Remove the heat if your skin turns bright red. This is especially important if you are unable to feel pain, heat, or cold. You may have a greater risk of getting burned. Medicines  Take over-the-counter and prescription medicines only as told by your health care provider.  Do not drive or use heavy machinery while taking prescription pain medicine. Contact a health care provider if:  Your muscle pain gets worse and medicines do not help.  You have muscle pain  that lasts longer than 3 days.  You have a rash or fever along with muscle pain.  You have muscle pain after a tick bite.  You have muscle pain while working out, even though you are in good physical condition.  You have redness, soreness, or swelling along with muscle pain.  You have muscle pain after starting a new medicine or changing the dose of a medicine. Get help right away if:  You have trouble breathing.  You have trouble swallowing.  You have muscle pain along with a stiff neck, fever, and  vomiting.  You have severe muscle weakness or cannot move part of your body. This information is not intended to replace advice given to you by your health care provider. Make sure you discuss any questions you have with your health care provider. Document Released: 12/15/2005 Document Revised: 08/13/2015 Document Reviewed: 06/15/2015 Elsevier Interactive Patient Education  2018 [employer].

## 2017-01-12 NOTE — Progress Notes (Signed)
Subjective: CC: flu like illness PCP: Chipper Herb, MD IPJ:[id number] Melvin [name] is a 66 y.o. male presenting to clinic today for:  1. Muscle ache Patient reports myalgia in primarily his right shoulder but occasionally in his left that started about 2 weeks ago.  He notes associated fatigue.  Denies cough, hemoptysis, congestion, rhinorrhea, sinus pressure, headache, SOB, dizziness, rash, nausea, vomiting, diarrhea, fevers, chills, sick contacts, recent travel.  Patient has used Motrin with good relief of symptoms.  He reports that he engaged in routine physical activity, which includes swimming over the last few weeks.  He is tolerated activity without difficulty but notes that the following day he was so fatigued that he had a lie back down into the floor after he got up from sleep.  No unplanned weight loss.  No chest pain, heart palpitations.  ROS: Per HPI  Allergies  Allergen Reactions  . Morphine Other (See Comments)    Headaches  . Statins     Muscle aches and myalgias with Lipitor,Zocor,Vytorin,Pravachol,Crestor, and Livalo even at low doses   Past Medical History:  Diagnosis Date  . BCC (basal cell carcinoma), face   . Degeneration of intervertebral disc, site unspecified   . Depressive disorder, not elsewhere classified   . Esophageal stricture   . Essential hypertension, benign   . GERD (gastroesophageal reflux disease)   . Hiatal hernia   . Hyperplasia of prostate   . Osteoarthrosis and allied disorders   . Sleep apnea     Current Outpatient Medications:  .  amLODipine (NORVASC) 5 MG tablet, Take 1.5 tablets (7.5 mg total) by mouth daily., Disp: 45 tablet, Rfl: 11 .  aspirin 81 MG EC tablet, Take 81 mg by mouth daily after supper.  , Disp: , Rfl:  .  Cholecalciferol (VITAMIN D3) 1000 UNITS CAPS, Take 1 capsule by mouth daily.  , Disp: , Rfl:  .  ezetimibe (ZETIA) 10 MG tablet, TAKE ONE (1) TABLET EACH DAY, Disp: 90 tablet, Rfl: 0 .  finasteride (PROPECIA) 1 MG tablet,  TAKE ONE (1) TABLET EACH DAY, Disp: 90 tablet, Rfl: 3 .  FLUoxetine (PROZAC) 20 MG capsule, TAKE 2 CAPSULES BY MOUTH EVERY MORNING AND 1 CAPSULE AT NIGHT, Disp: 270 capsule, Rfl: 0 .  fluticasone (FLONASE) 50 MCG/ACT nasal spray, Place 1 spray into both nostrils daily., Disp: 16 g, Rfl: 11 .  ibuprofen (ADVIL,MOTRIN) 800 MG tablet, TAKE ONE TABLET 3 TIMES A DAY AS NEEDED., Disp: 90 tablet, Rfl: 1 .  omega-3 acid ethyl esters (LOVAZA) 1 g capsule, TAKE 4 CAPSULES DAILY, Disp: 360 capsule, Rfl: 1 .  rosuvastatin (CRESTOR) 5 MG tablet, Take 0.5 tablets (2.5 mg total) by mouth daily. at bedtime, Disp: 45 tablet, Rfl: 1 .  sildenafil (VIAGRA) 100 MG tablet, TAKE AS DIRECTED, Disp: 6 tablet, Rfl: 4 .  tobramycin (TOBREX) 0.3 % ophthalmic solution, Place 1 drop into the left eye every 4 (four) hours., Disp: 5 mL, Rfl: 0 Social History   Socioeconomic History  . Marital status: Married    Spouse name: Not on file  . Number of children: Not on file  . Years of education: Not on file  . Highest education level: Not on file  Social Needs  . Financial resource strain: Not on file  . Food insecurity - worry: Not on file  . Food insecurity - inability: Not on file  . Transportation needs - medical: Not on file  . Transportation needs - non-medical: Not on file  Occupational History  . Not on file  Tobacco Use  . Smoking status: Current Some Day Smoker    Types: Cigars  . Smokeless tobacco: Never Used  Substance and Sexual Activity  . Alcohol use: Yes    Comment: mix  . Drug use: No  . Sexual activity: Yes  Other Topics Concern  . Not on file  Social History Narrative  . Not on file   Family History  Problem Relation Age of Onset  . Lymphoma Mother   . Arthritis Mother   . Asthma Mother   . Kidney failure Father   . Hypertension Father   . Hypertension Brother   . Hyperlipidemia Brother   . Bipolar disorder Brother     Objective: Office vital signs reviewed. BP (!) 159/82    Pulse 89   Temp 99.4 F (37.4 Melvin) (Oral)   Ht 6' (1.829 m)   Wt 205 lb (93 kg)   BMI 27.80 kg/m   Physical Examination:  General: Awake, alert, well nourished, well appearing, No acute distress HEENT: Normal    Neck: No masses palpated. No lymphadenopathy    Ears: Tympanic membranes intact, normal light reflex, no erythema, no bulging    Eyes: PERRLA, extraocular membranes intact, sclera white    Nose: nasal turbinates moist, no nasal discharge    Throat: moist mucus membranes, no erythema, no tonsillar exudate.  Airway is patent Cardio: regular rate and rhythm, S1S2 heard, no murmurs appreciated Pulm: clear to auscultation bilaterally, no wheezes, rhonchi or rales; normal work of breathing on room air MSK: Normal gait and station  Melvin-spine: Has full active range of motion in all planes.  No midline tenderness to palpation.  No paraspinal muscle tenderness to palpation.  No tenderness to palpation to the trapezius muscles.  Upper extremity: Has full active range of motion of bilateral upper extremities in abduction and flexion.  Some discomfort with internal rotation and external rotation of bilateral shoulders.  No gross bony abnormalities appreciated.  No erythema, ecchymosis, joint effusions.  No tenderness to palpation to either shoulder. 5/5 upper extremity strength bilaterally.  He does have slight weakness in the right upper extremity with empty can testing.  Pain was not reproducible with this. Extremities: Warm, well-perfused, no edema.  Assessment/ Plan: 66 y.o. male   1. Myalgia Patient is well-appearing.  He does have a low-grade temperature to 99.4 degrees Fahrenheit.  It is possible that he is got a viral illness causing the symptoms.  However, seems to be primarily a unilateral issue.  I do wonder if he is aggravated his right shoulder.  He does have a history of surgery in this shoulder previously.  Rapid flu was obtained given low-grade temperature and this was negative.   Will obtain liver function test, TSH, CBC and CK to rule out other etiologies.  Autoimmune workup with ANA, ESR and CRP was considered but again, given the predominantly unilateral nature and autoimmune process would be fairly unlikely.  Will contact patient once lab results are available.  In the interim, patient was responding well to Motrin 400 mg.  I instructed him to continue this medication as long as it is helpful.  Push oral fluids.  Strict return precautions and reasons for emergent evaluation in the emergency department review with patient.  He voiced understanding and will follow-up as needed. - Veritor Flu A/B Waived - CMP14+EGFR - TSH - CBC with Differential - CK  2. Decreased energy - CMP14+EGFR - TSH - CBC with  Differential  3. Acute pain of right shoulder See above   Orders Placed This Encounter  Procedures  . Veritor Flu A/B Waived    Order Specific Question:   Source    Answer:   nasal  . CMP14+EGFR  . TSH  . CBC with Differential  . CK    Siobhan Zaro Windell Moulding, DO [place] [phone]

## 2017-01-13 LAB — CMP14+EGFR
ALBUMIN: 4.2 g/dL (ref 3.6–4.8)
ALK PHOS: 59 IU/L (ref 39–117)
ALT: 21 IU/L (ref 0–44)
AST: 19 IU/L (ref 0–40)
Albumin/Globulin Ratio: 1.7 (ref 1.2–2.2)
BILIRUBIN TOTAL: 0.4 mg/dL (ref 0.0–1.2)
BUN / CREAT RATIO: 21 (ref 10–24)
BUN: 16 mg/dL (ref 8–27)
CHLORIDE: 102 mmol/L (ref 96–106)
CO2: 22 mmol/L (ref 20–29)
Calcium: 9.4 mg/dL (ref 8.6–10.2)
Creatinine, Ser: 0.76 mg/dL (ref 0.76–1.27)
GFR calc Af Amer: 108 mL/min/{1.73_m2} (ref >59)
GFR calc non Af Amer: 94 mL/min/{1.73_m2} (ref >59)
GLOBULIN, TOTAL: 2.5 g/dL (ref 1.5–4.5)
GLUCOSE: 127 mg/dL — AB (ref 65–99)
Potassium: 4.2 mmol/L (ref 3.5–5.2)
SODIUM: 137 mmol/L (ref 134–144)
Total Protein: 6.7 g/dL (ref 6.0–8.5)

## 2017-01-13 LAB — CBC WITH DIFFERENTIAL/PLATELET
BASOS ABS: 0 10*3/uL (ref 0.0–0.2)
BASOS: 0 %
EOS (ABSOLUTE): 0.2 10*3/uL (ref 0.0–0.4)
Eos: 3 %
Hematocrit: 43.6 % (ref 37.5–51.0)
Hemoglobin: 15 g/dL (ref 13.0–17.7)
Immature Grans (Abs): 0 10*3/uL (ref 0.0–0.1)
Immature Granulocytes: 0 %
LYMPHS ABS: 1.9 10*3/uL (ref 0.7–3.1)
Lymphs: 34 %
MCH: 30.5 pg (ref 26.6–33.0)
MCHC: 34.4 g/dL (ref 31.5–35.7)
MCV: 89 fL (ref 79–97)
MONOS ABS: 0.5 10*3/uL (ref 0.1–0.9)
Monocytes: 9 %
NEUTROS ABS: 3 10*3/uL (ref 1.4–7.0)
Neutrophils: 54 %
PLATELETS: 188 10*3/uL (ref 150–379)
RBC: 4.92 x10E6/uL (ref 4.14–5.80)
RDW: 13.8 % (ref 12.3–15.4)
WBC: 5.7 10*3/uL (ref 3.4–10.8)

## 2017-01-13 LAB — CK: CK TOTAL: 63 U/L (ref 24–204)

## 2017-01-13 LAB — TSH: TSH: 1.09 u[IU]/mL (ref 0.450–4.500)

## 2017-01-17 ENCOUNTER — Ambulatory Visit: Payer: BLUE CROSS/BLUE SHIELD | Admitting: Family Medicine

## 2017-01-17 ENCOUNTER — Ambulatory Visit (INDEPENDENT_AMBULATORY_CARE_PROVIDER_SITE_OTHER): Payer: BLUE CROSS/BLUE SHIELD

## 2017-01-17 ENCOUNTER — Encounter: Payer: Self-pay | Admitting: Family Medicine

## 2017-01-17 VITALS — BP 135/73 | HR 71 | Temp 97.7°F | Ht 72.0 in | Wt 210.0 lb

## 2017-01-17 DIAGNOSIS — M25511 Pain in right shoulder: Secondary | ICD-10-CM | POA: Diagnosis not present

## 2017-01-17 NOTE — Progress Notes (Signed)
Subjective:    Patient ID: Melvin [name], male    DOB: Jan 25, 1951, 66 y.o.   MRN: [id number]  HPI Patient here today for right shoulder pain that flared up about 1 week ago.  The patient comes to the office for ongoing right shoulder pain.  He has had surgery on the shoulder and has also had an infection of the shoulder in the past.  Lab work was done on the patient just at his last visit and the CBC including a white blood cell count and hemoglobin were all normal.  The blood sugar was slightly elevated at 127.  The creatinine and electrolytes and liver function tests were normal.  The CK was normal and TSH was normal.  He was also tested for influenza a and B and both of these tests were negative.  This will be reviewed with him during the visit today.  The patient has a history of shoulder surgery with admission for that and having to be readmitted and taking antibiotics for an infection that occurred after the shoulder surgery.  He did see someone in [place] and sports medicine.  On review, Dr. Tamera Punt was in charge of the patient at that time and he developed a postop infection of the [facility] joint.  This was in June 2012.     Patient Active Problem List   Diagnosis Date Noted  . Seasonal and perennial allergic rhinitis 02/17/2016  . Erectile dysfunction 04/15/2013  . Statin intolerance 04/15/2013  . Hyperlipidemia 04/15/2013  . BCC (basal cell carcinoma), face   . Essential hypertension, benign   . Hiatal hernia with gastroesophageal reflux    . Degeneration of intervertebral disc, site unspecified   . Esophageal stricture, history of   . Obstructive sleep apnea   . Depression   . Osteoarthritis   . BPH (benign prostatic hyperplasia)    Outpatient Encounter Medications as of 01/17/2017  Medication Sig  . amLODipine (NORVASC) 5 MG tablet Take 1.5 tablets (7.5 mg total) by mouth daily.  Marland Kitchen aspirin 81 MG EC tablet Take 81 mg by mouth daily after supper.    .  Cholecalciferol (VITAMIN D3) 1000 UNITS CAPS Take 1 capsule by mouth daily.    Marland Kitchen ezetimibe (ZETIA) 10 MG tablet TAKE ONE (1) TABLET EACH DAY  . finasteride (PROPECIA) 1 MG tablet TAKE ONE (1) TABLET EACH DAY  . FLUoxetine (PROZAC) 20 MG capsule TAKE 2 CAPSULES BY MOUTH EVERY MORNING AND 1 CAPSULE AT NIGHT  . fluticasone (FLONASE) 50 MCG/ACT nasal spray Place 1 spray into both nostrils daily.  Marland Kitchen ibuprofen (ADVIL,MOTRIN) 800 MG tablet TAKE ONE TABLET 3 TIMES A DAY AS NEEDED.  Marland Kitchen sildenafil (VIAGRA) 100 MG tablet TAKE AS DIRECTED  . [DISCONTINUED] omega-3 acid ethyl esters (LOVAZA) 1 g capsule TAKE 4 CAPSULES DAILY  . [DISCONTINUED] rosuvastatin (CRESTOR) 5 MG tablet Take 0.5 tablets (2.5 mg total) by mouth daily. at bedtime  . [DISCONTINUED] tobramycin (TOBREX) 0.3 % ophthalmic solution Place 1 drop into the left eye every 4 (four) hours.   No facility-administered encounter medications on file as of 01/17/2017.      Review of Systems  Constitutional: Positive for fatigue (no energy ).  HENT: Negative.   Eyes: Negative.   Respiratory: Negative.   Cardiovascular: Negative.   Gastrointestinal: Negative.   Endocrine: Negative.   Genitourinary: Negative.   Musculoskeletal: Positive for arthralgias (right shoulder pain ).  Skin: Negative.   Allergic/Immunologic: Negative.   Neurological: Negative.   Hematological: Negative.  Psychiatric/Behavioral: Negative.         Objective:   Physical Exam  Constitutional: He is oriented to person, place, and time. He appears well-developed and well-nourished. No distress.  HENT:  Head: Normocephalic and atraumatic.  Eyes: Conjunctivae and EOM are normal. Pupils are equal, round, and reactive to light. Right eye exhibits no discharge. Left eye exhibits no discharge. No scleral icterus.  Neck: Normal range of motion.  Musculoskeletal: He exhibits tenderness. He exhibits no edema or deformity.  Patient has persistent right shoulder pain.  There is  no swelling point tenderness or rubor.  There is limited range of motion.  There is generalized pain present.  Nothing specific at the acromioclavicular joint.  Neurological: He is alert and oriented to person, place, and time.  Skin: No rash noted. No erythema.  Psychiatric: He has a normal mood and affect. His behavior is normal. Judgment and thought content normal.  Nursing note and vitals reviewed.  BP 135/73 (BP Location: Left Arm)   Pulse 71   Temp 97.7 F (36.5 C) (Oral)   Ht 6' (1.829 m)   Wt 210 lb (95.3 kg)   BMI 28.48 kg/m    X-ray of right shoulder with results pending==     Assessment & Plan:  1. Acute pain of right shoulder -Arrange for visit with Dr. Tamera Punt. - DG Shoulder Right; Future  Patient Instructions  We will do a referral to his previous orthopedist because of the history of having surgery on the shoulder.  We will make sure that he is aware that he has had x-rays done and he will take a copy of the recent blood work with him to that visit  Arrie Senate MD

## 2017-01-17 NOTE — Patient Instructions (Signed)
We will do a referral to his previous orthopedist because of the history of having surgery on the shoulder.  We will make sure that he is aware that he has had x-rays done and he will take a copy of the recent blood work with him to that visit

## 2017-01-18 ENCOUNTER — Telehealth: Payer: Self-pay | Admitting: Family Medicine

## 2017-01-18 NOTE — Telephone Encounter (Signed)
Pt aware.

## 2017-01-22 ENCOUNTER — Other Ambulatory Visit: Payer: Self-pay | Admitting: Family Medicine

## 2017-02-19 ENCOUNTER — Ambulatory Visit: Payer: BLUE CROSS/BLUE SHIELD | Admitting: Internal Medicine

## 2017-02-19 ENCOUNTER — Encounter: Payer: Self-pay | Admitting: Internal Medicine

## 2017-02-19 VITALS — BP 110/66 | HR 72 | Ht 72.0 in | Wt 209.8 lb

## 2017-02-19 DIAGNOSIS — G4733 Obstructive sleep apnea (adult) (pediatric): Secondary | ICD-10-CM

## 2017-02-19 NOTE — Patient Instructions (Signed)
Order- [facility]- please replace old CPAP machine, change to auto 5-20, mask of choice, humidifier, supplies, AirView     Dx OSA  Please call us as needed

## 2017-02-19 NOTE — Progress Notes (Signed)
02/17/16-67 year old male smoker referred by Dr Laurance Flatten for sleep medicine evaluation with history of obstructive sleep apnea.. Remote sleep study in the 1990s in [facility], treated with CPAP but no active follow-up. Epworth Score 5/24 Medical history of hypertension, osteoarthritis with arthroplasties Dr Laurance Flatten; Sleep Study about 20 years ago; DME [facility]-currently wears CPAP machine. [facility] with current machine about 69 years old, no break in therapy. Using a nasal pillows mask which he adjusts for comfort. Sleeps better and feels better with CPAP. Usual bedtime 1 AM, sleep latency 10-15 minutes, rarely wakes at night, up 8 or 9 AM. Always a slow starter in the mornings, better after 2 or 3 cups of coffee. Rare daytime sleepiness. No routine sleep medicines. ENT surgery-septoplasty. Seasonal rhinitis controlled with Flonase. He denies problems with thyroid, lung or heart. Quit cigarettes 30 years ago, smokes occasional cigar. 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  02/19/17-67 year old male smoker followed for OSA, complicated by HBP, osteoarthritis, allergic rhinitis, GERD ----OSA; DME:[facility] Pt wears CPAP nightly and DL attached.  Pt would like to see if he is able to get new CPAP machine as his is older. >> auto 5-20 He definitely feels better off with CPAP and is compliant, used every night.  Machine is too old for [place] to download.  He has not been using humidifier, which we discussed.  He asks about cleaning machines.  ROS-see HPI   + = positive Constitutional:    weight loss, night sweats, fevers, chills, +fatigue, lassitude. HEENT:    headaches, difficulty swallowing, tooth/dental problems, sore throat,       sneezing, itching, ear ache, +nasal congestion, post nasal drip, snoring CV:    chest pain, orthopnea, PND, swelling in lower extremities, anasarca,                                                        dizziness, palpitations Resp:   +shortness  of breath with exertion or at rest.                productive cough,   non-productive cough, coughing up of blood.              change in color of mucus.  wheezing.   Skin:    rash or lesions. GI:  No-   heartburn, indigestion, abdominal pain, nausea, vomiting, diarrhea,                 change in bowel habits, loss of appetite GU: dysuria, change in color of urine, no urgency or frequency.   flank pain. MS:   +joint pain, stiffness, decreased range of motion, back pain. Neuro-     nothing unusual Psych:  change in mood or affect.  depression or anxiety.   memory loss.  OBJ- Physical Exam General- Alert, Oriented, Affect-appropriate, Distress- none acute, not obese Skin- rash-none, lesions- none, excoriation- none Lymphadenopathy- none Head- atraumatic            Eyes- Gross vision intact, PERRLA, conjunctivae and secretions clear            Ears- Hearing, canals-normal            Nose- Clear, no-Septal dev, mucus, polyps, erosion, perforation + old external nasal trauma  Throat- Mallampati II , mucosa clear , drainage- none, tonsils- atrophic Neck- flexible , trachea midline, no stridor , thyroid nl, carotid no bruit, own teeth Chest - symmetrical excursion , unlabored           Heart/CV- RRR , no murmur , no gallop  , no rub, nl s1 s2                           - JVD- none , edema- none, stasis changes- none, varices- none           Lung- clear to P&A, wheeze- none, cough- none , dullness-none, rub- none           Chest wall-  Abd-  Br/ Gen/ Rectal- Not done, not indicated Extrem- cyanosis- none, clubbing, none, atrophy- none, strength- nl Neuro- grossly intact to observation

## 2017-02-19 NOTE — Assessment & Plan Note (Signed)
He has been very compliant with CPAP, without break in therapy and definitely feels better using it.  Machine is now old and we cannot download it. Plan-changed to AutoPap with new machine.  We discussed use of cleaning machines and humidifiers.

## 2017-02-20 ENCOUNTER — Telehealth: Payer: Self-pay | Admitting: Internal Medicine

## 2017-02-20 DIAGNOSIS — G4733 Obstructive sleep apnea (adult) (pediatric): Secondary | ICD-10-CM

## 2017-02-20 NOTE — Telephone Encounter (Signed)
I have called and lmomtcb x 1 for [facility] with [facility].  Looks like the pt had sleep study 20 + years ago.  No recent sleep study in his chart.  Pt was needing to get a new machine as his machine is too old to get a DL.

## 2017-02-21 NOTE — Telephone Encounter (Signed)
Ok to order unattended Home Sleep Test for dx OSA. This study is to be done off his CPAP.   Please have him call us for results and recommendations about 2 weeks after this test, with assumption it will qualify allowing [place] to order replacement for old CPAP machine through his insurance.  Please schedule him a return ov in 3 months, per insurance regs, to document establishment with his replacement machine.

## 2017-02-21 NOTE — Telephone Encounter (Signed)
Called pt letting him know that we were needing to order an unattended HST off of his CPAP to see if he will be able to qualify for a replacement CPAP machine.  Pt expressed the understanding of the above stated. Told pt if he had not heard from [place] 2 weeks after having the unattended HST to call our office to see if we had received the results.  Told pt that based off the results of the unattended HST, if pt did qualify for a replacement CPAP, we would place the order once hearing from [place] and at that same time, we would schedule a 3 month follow up appt for pt per insurance recs.   Pt expressed the understanding of all the above. Order placed for unattended HST.

## 2017-02-21 NOTE — Telephone Encounter (Signed)
Spoke with Barbaraann Rondo, he states the pt needs a new sleep study to obtain a new CPAP machine. He mentioned the pt has [employer] and they will cover a home sleep test. CY can we order sleep test for pt?   ATC pt, no answer. Left message for pt to call back. I want to let him know he needs a new sleep study.

## 2017-02-27 ENCOUNTER — Other Ambulatory Visit: Payer: Self-pay | Admitting: Nurse Practitioner

## 2017-03-18 ENCOUNTER — Other Ambulatory Visit: Payer: Self-pay | Admitting: Family

## 2017-03-18 ENCOUNTER — Other Ambulatory Visit: Payer: Self-pay | Admitting: Family Medicine

## 2017-03-28 DIAGNOSIS — G4733 Obstructive sleep apnea (adult) (pediatric): Secondary | ICD-10-CM

## 2017-03-29 ENCOUNTER — Other Ambulatory Visit: Payer: Self-pay | Admitting: *Deleted

## 2017-03-29 DIAGNOSIS — G4733 Obstructive sleep apnea (adult) (pediatric): Secondary | ICD-10-CM

## 2017-04-23 ENCOUNTER — Other Ambulatory Visit: Payer: Self-pay | Admitting: Family Medicine

## 2017-05-09 ENCOUNTER — Other Ambulatory Visit: Payer: Self-pay

## 2017-05-09 DIAGNOSIS — G4733 Obstructive sleep apnea (adult) (pediatric): Secondary | ICD-10-CM

## 2017-05-09 NOTE — Progress Notes (Signed)
Copied from [place] result note.    Notes recorded by Deneise Lever, MD on 05/04/2017 at 3:51 PM EDT Home sleep test- showed severe obstructive sleep apnea, averaging 31 apneas// hour with drops in blood oxygen level.  As per our office discussion, I recommend new order for DME, new CPAP auto 5-20, mask of choice, humidifier, supplies, AirView Dx OSA  Please make sure he has a return ov in 31-90 days, per insurance requirements  --------- Order has been placed. Patient is aware. Patient has been scheduled for a follow up.

## 2017-05-31 ENCOUNTER — Other Ambulatory Visit: Payer: Self-pay | Admitting: Nurse Practitioner

## 2017-06-01 ENCOUNTER — Telehealth: Payer: Self-pay | Admitting: Internal Medicine

## 2017-06-01 NOTE — Telephone Encounter (Signed)
Called spoke with [facility] w/ [place] Verified that pt noshowed for his CPAP setup yesterday 4/25 Pt will be contacted to reschedule  Per Melissa, this is just an FYI for Dr Annamaria Boots Will sign and route to [facility]

## 2017-06-01 NOTE — Telephone Encounter (Signed)
Noted  

## 2017-06-26 ENCOUNTER — Encounter: Payer: Self-pay | Admitting: *Deleted
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
## 2017-06-27 ENCOUNTER — Other Ambulatory Visit: Payer: Self-pay

## 2017-06-27 ENCOUNTER — Encounter: Payer: Self-pay | Admitting: *Deleted

## 2017-06-27 ENCOUNTER — Encounter (HOSPITAL_COMMUNITY): Payer: Self-pay

## 2017-06-27 ENCOUNTER — Inpatient Hospital Stay (HOSPITAL_COMMUNITY)
Admission: RE | Admit: 2017-06-27 | Discharge: 2017-07-10 | DRG: 560 | Disposition: A | Discharge status: Home Health | Payer: BLUE CROSS/BLUE SHIELD | Source: Other Acute Inpatient Hospital | Attending: Physical Medicine & Rehabilitation | Admitting: Physical Medicine & Rehabilitation

## 2017-06-27 DIAGNOSIS — D62 Acute posthemorrhagic anemia: Secondary | ICD-10-CM | POA: Diagnosis present

## 2017-06-27 DIAGNOSIS — B961 Klebsiella pneumoniae [K. pneumoniae] as the cause of diseases classified elsewhere: Secondary | ICD-10-CM | POA: Diagnosis present

## 2017-06-27 DIAGNOSIS — Z96649 Presence of unspecified artificial hip joint: Secondary | ICD-10-CM | POA: Insufficient documentation

## 2017-06-27 DIAGNOSIS — Z981 Arthrodesis status: Secondary | ICD-10-CM | POA: Diagnosis not present

## 2017-06-27 DIAGNOSIS — M978XXA Periprosthetic fracture around other internal prosthetic joint, initial encounter: Secondary | ICD-10-CM | POA: Insufficient documentation

## 2017-06-27 DIAGNOSIS — E8809 Other disorders of plasma-protein metabolism, not elsewhere classified: Secondary | ICD-10-CM

## 2017-06-27 DIAGNOSIS — K219 Gastro-esophageal reflux disease without esophagitis: Secondary | ICD-10-CM | POA: Diagnosis present

## 2017-06-27 DIAGNOSIS — Z87828 Personal history of other (healed) physical injury and trauma: Secondary | ICD-10-CM

## 2017-06-27 DIAGNOSIS — Z885 Allergy status to narcotic agent status: Secondary | ICD-10-CM

## 2017-06-27 DIAGNOSIS — F329 Major depressive disorder, single episode, unspecified: Secondary | ICD-10-CM

## 2017-06-27 DIAGNOSIS — Z96643 Presence of artificial hip joint, bilateral: Secondary | ICD-10-CM | POA: Diagnosis present

## 2017-06-27 DIAGNOSIS — Z6826 Body mass index (BMI) 26.0-26.9, adult: Secondary | ICD-10-CM

## 2017-06-27 DIAGNOSIS — K5901 Slow transit constipation: Secondary | ICD-10-CM | POA: Diagnosis not present

## 2017-06-27 DIAGNOSIS — Z7901 Long term (current) use of anticoagulants: Secondary | ICD-10-CM

## 2017-06-27 DIAGNOSIS — K5903 Drug induced constipation: Secondary | ICD-10-CM

## 2017-06-27 DIAGNOSIS — K449 Diaphragmatic hernia without obstruction or gangrene: Secondary | ICD-10-CM | POA: Diagnosis present

## 2017-06-27 DIAGNOSIS — I10 Essential (primary) hypertension: Secondary | ICD-10-CM | POA: Diagnosis present

## 2017-06-27 DIAGNOSIS — N400 Enlarged prostate without lower urinary tract symptoms: Secondary | ICD-10-CM | POA: Diagnosis present

## 2017-06-27 DIAGNOSIS — T402X5A Adverse effect of other opioids, initial encounter: Secondary | ICD-10-CM | POA: Diagnosis present

## 2017-06-27 DIAGNOSIS — G4700 Insomnia, unspecified: Secondary | ICD-10-CM | POA: Diagnosis present

## 2017-06-27 DIAGNOSIS — E1141 Type 2 diabetes mellitus with diabetic mononeuropathy: Secondary | ICD-10-CM

## 2017-06-27 DIAGNOSIS — Z8249 Family history of ischemic heart disease and other diseases of the circulatory system: Secondary | ICD-10-CM

## 2017-06-27 DIAGNOSIS — Z95828 Presence of other vascular implants and grafts: Secondary | ICD-10-CM

## 2017-06-27 DIAGNOSIS — R339 Retention of urine, unspecified: Secondary | ICD-10-CM | POA: Diagnosis not present

## 2017-06-27 DIAGNOSIS — S92192D Other fracture of left talus, subsequent encounter for fracture with routine healing: Secondary | ICD-10-CM | POA: Diagnosis not present

## 2017-06-27 DIAGNOSIS — T1490XA Injury, unspecified, initial encounter: Secondary | ICD-10-CM | POA: Diagnosis not present

## 2017-06-27 DIAGNOSIS — S92033K Displaced avulsion fracture of tuberosity of unspecified calcaneus, subsequent encounter for fracture with nonunion: Secondary | ICD-10-CM | POA: Insufficient documentation

## 2017-06-27 DIAGNOSIS — N390 Urinary tract infection, site not specified: Secondary | ICD-10-CM | POA: Diagnosis present

## 2017-06-27 DIAGNOSIS — Z79899 Other long term (current) drug therapy: Secondary | ICD-10-CM

## 2017-06-27 DIAGNOSIS — S322XXD Fracture of coccyx, subsequent encounter for fracture with routine healing: Secondary | ICD-10-CM

## 2017-06-27 DIAGNOSIS — B9689 Other specified bacterial agents as the cause of diseases classified elsewhere: Secondary | ICD-10-CM

## 2017-06-27 DIAGNOSIS — S52692E Other fracture of lower end of left ulna, subsequent encounter for open fracture type I or II with routine healing: Secondary | ICD-10-CM

## 2017-06-27 DIAGNOSIS — Z7982 Long term (current) use of aspirin: Secondary | ICD-10-CM

## 2017-06-27 DIAGNOSIS — G479 Sleep disorder, unspecified: Secondary | ICD-10-CM | POA: Diagnosis not present

## 2017-06-27 DIAGNOSIS — F32A Depression, unspecified: Secondary | ICD-10-CM

## 2017-06-27 DIAGNOSIS — F321 Major depressive disorder, single episode, moderate: Secondary | ICD-10-CM | POA: Diagnosis not present

## 2017-06-27 DIAGNOSIS — S301XXD Contusion of abdominal wall, subsequent encounter: Secondary | ICD-10-CM

## 2017-06-27 DIAGNOSIS — S52592E Other fractures of lower end of left radius, subsequent encounter for open fracture type I or II with routine healing: Principal | ICD-10-CM

## 2017-06-27 DIAGNOSIS — T07XXXA Unspecified multiple injuries, initial encounter: Secondary | ICD-10-CM

## 2017-06-27 DIAGNOSIS — Z85828 Personal history of other malignant neoplasm of skin: Secondary | ICD-10-CM

## 2017-06-27 DIAGNOSIS — Z7951 Long term (current) use of inhaled steroids: Secondary | ICD-10-CM

## 2017-06-27 DIAGNOSIS — I959 Hypotension, unspecified: Secondary | ICD-10-CM | POA: Diagnosis not present

## 2017-06-27 DIAGNOSIS — T8189XA Other complications of procedures, not elsewhere classified, initial encounter: Secondary | ICD-10-CM | POA: Diagnosis not present

## 2017-06-27 DIAGNOSIS — H04123 Dry eye syndrome of bilateral lacrimal glands: Secondary | ICD-10-CM | POA: Diagnosis not present

## 2017-06-27 DIAGNOSIS — Z888 Allergy status to other drugs, medicaments and biological substances status: Secondary | ICD-10-CM

## 2017-06-27 DIAGNOSIS — N179 Acute kidney failure, unspecified: Secondary | ICD-10-CM | POA: Diagnosis present

## 2017-06-27 DIAGNOSIS — G4733 Obstructive sleep apnea (adult) (pediatric): Secondary | ICD-10-CM | POA: Diagnosis present

## 2017-06-27 DIAGNOSIS — F17290 Nicotine dependence, other tobacco product, uncomplicated: Secondary | ICD-10-CM | POA: Diagnosis present

## 2017-06-27 DIAGNOSIS — E441 Mild protein-calorie malnutrition: Secondary | ICD-10-CM | POA: Diagnosis present

## 2017-06-27 DIAGNOSIS — Z7984 Long term (current) use of oral hypoglycemic drugs: Secondary | ICD-10-CM

## 2017-06-27 DIAGNOSIS — E119 Type 2 diabetes mellitus without complications: Secondary | ICD-10-CM | POA: Diagnosis present

## 2017-06-27 DIAGNOSIS — S32048D Other fracture of fourth lumbar vertebra, subsequent encounter for fracture with routine healing: Secondary | ICD-10-CM

## 2017-06-27 DIAGNOSIS — Z86711 Personal history of pulmonary embolism: Secondary | ICD-10-CM | POA: Insufficient documentation

## 2017-06-27 DIAGNOSIS — E46 Unspecified protein-calorie malnutrition: Secondary | ICD-10-CM | POA: Diagnosis not present

## 2017-06-27 DIAGNOSIS — R0989 Other specified symptoms and signs involving the circulatory and respiratory systems: Secondary | ICD-10-CM | POA: Diagnosis not present

## 2017-06-27 DIAGNOSIS — S3210XD Unspecified fracture of sacrum, subsequent encounter for fracture with routine healing: Secondary | ICD-10-CM

## 2017-06-27 HISTORY — DX: Injury, unspecified, initial encounter: T14.90XA

## 2017-06-27 LAB — GLUCOSE, CAPILLARY
GLUCOSE-CAPILLARY: 150 mg/dL — AB (ref 65–99)
Glucose-Capillary: 110 mg/dL — ABNORMAL HIGH (ref 65–99)
Glucose-Capillary: 119 mg/dL — ABNORMAL HIGH (ref 65–99)

## 2017-06-27 MED ORDER — METFORMIN HCL ER 500 MG PO TB24
500.00 | ORAL_TABLET | ORAL | Status: DC
Start: 2017-06-28 — End: 2017-06-27

## 2017-06-27 MED ORDER — ONDANSETRON HCL 4 MG/2ML IJ SOLN
4.00 | INTRAMUSCULAR | Status: DC
Start: ? — End: 2017-06-27

## 2017-06-27 MED ORDER — TRAZODONE HCL 50 MG PO TABS: 25.0000 mg | ORAL_TABLET | Freq: Every evening | ORAL | Status: DC | PRN

## 2017-06-27 MED ORDER — PROCHLORPERAZINE MALEATE 5 MG PO TABS: 5.0000 mg | ORAL_TABLET | Freq: Four times a day (QID) | ORAL | Status: DC | PRN

## 2017-06-27 MED ORDER — FLUOXETINE HCL 20 MG PO CAPS
60.0000 mg | ORAL_CAPSULE | Freq: Every day | ORAL | Status: DC
  Administered 2017-06-28 – 2017-07-10 (×13): 60 mg via ORAL
  Filled 2017-06-27 (×6): qty 3
  Filled 2017-06-27: qty 6
  Filled 2017-06-27 (×7): qty 3

## 2017-06-27 MED ORDER — AMLODIPINE BESYLATE 5 MG PO TABS
5.0000 mg | ORAL_TABLET | Freq: Every day | ORAL | Status: DC
  Administered 2017-06-28 – 2017-07-10 (×13): 5 mg via ORAL
  Filled 2017-06-27 (×13): qty 1

## 2017-06-27 MED ORDER — PNEUMOCOCCAL VAC POLYVALENT 25 MCG/0.5ML IJ INJ
0.5000 mL | INJECTION | INTRAMUSCULAR | Status: DC
  Filled 2017-06-27: qty 0.5

## 2017-06-27 MED ORDER — INSULIN ASPART 100 UNIT/ML ~~LOC~~ SOLN
0.0000 [IU] | Freq: Three times a day (TID) | SUBCUTANEOUS | Status: DC
  Administered 2017-06-28: 2 [IU] via SUBCUTANEOUS
  Administered 2017-06-28 – 2017-06-29 (×3): 1 [IU] via SUBCUTANEOUS
  Administered 2017-06-29: 2 [IU] via SUBCUTANEOUS
  Administered 2017-06-29 – 2017-07-02 (×7): 1 [IU] via SUBCUTANEOUS
  Administered 2017-07-03: 2 [IU] via SUBCUTANEOUS
  Administered 2017-07-03 – 2017-07-05 (×5): 1 [IU] via SUBCUTANEOUS
  Administered 2017-07-06: 2 [IU] via SUBCUTANEOUS
  Administered 2017-07-06: 1 [IU] via SUBCUTANEOUS
  Administered 2017-07-07: 2 [IU] via SUBCUTANEOUS
  Administered 2017-07-07 – 2017-07-09 (×5): 1 [IU] via SUBCUTANEOUS

## 2017-06-27 MED ORDER — METFORMIN HCL ER 500 MG PO TB24
500.0000 mg | ORAL_TABLET | Freq: Every day | ORAL | Status: DC
  Administered 2017-06-28 – 2017-07-03 (×6): 500 mg via ORAL
  Filled 2017-06-27 (×7): qty 1

## 2017-06-27 MED ORDER — VITAMIN C 500 MG PO TABS
500.0000 mg | ORAL_TABLET | Freq: Two times a day (BID) | ORAL | Status: DC
  Administered 2017-06-27 – 2017-07-10 (×26): 500 mg via ORAL
  Filled 2017-06-27 (×26): qty 1

## 2017-06-27 MED ORDER — ASPIRIN 81 MG PO CHEW
81.00 | CHEWABLE_TABLET | ORAL | Status: DC
Start: 2017-06-28 — End: 2017-06-27

## 2017-06-27 MED ORDER — BISACODYL 10 MG RE SUPP: 10.0000 mg | Freq: Every day | RECTAL | Status: DC | PRN

## 2017-06-27 MED ORDER — FINASTERIDE 1 MG PO TABS
1.00 | ORAL_TABLET | ORAL | Status: DC
Start: 2017-06-28 — End: 2017-06-27

## 2017-06-27 MED ORDER — GUAIFENESIN-DM 100-10 MG/5ML PO SYRP: 5.0000 mL | ORAL_SOLUTION | Freq: Four times a day (QID) | ORAL | Status: DC | PRN

## 2017-06-27 MED ORDER — APIXABAN 5 MG PO TABS
5.0000 mg | ORAL_TABLET | Freq: Two times a day (BID) | ORAL | Status: DC
  Administered 2017-06-27 – 2017-07-10 (×26): 5 mg via ORAL
  Filled 2017-06-27 (×26): qty 1

## 2017-06-27 MED ORDER — POLYETHYLENE GLYCOL 3350 17 G PO PACK
17.00 | PACK | ORAL | Status: DC
Start: ? — End: 2017-06-27

## 2017-06-27 MED ORDER — FENTANYL CITRATE (PF) 2500 MCG/50ML IJ SOLN
25.00 | INTRAMUSCULAR | Status: DC
Start: ? — End: 2017-06-27

## 2017-06-27 MED ORDER — PROCHLORPERAZINE EDISYLATE 10 MG/2ML IJ SOLN: 5.0000 mg | Freq: Four times a day (QID) | INTRAMUSCULAR | Status: DC | PRN

## 2017-06-27 MED ORDER — ACETAMINOPHEN 325 MG PO TABS
325.0000 mg | ORAL_TABLET | ORAL | Status: DC | PRN
  Administered 2017-06-27: 650 mg via ORAL
  Administered 2017-06-28: 325 mg via ORAL
  Filled 2017-06-27: qty 1
  Filled 2017-06-27 (×2): qty 2

## 2017-06-27 MED ORDER — POLYETHYLENE GLYCOL 3350 17 G PO PACK
17.0000 g | PACK | Freq: Every day | ORAL | Status: DC | PRN
  Administered 2017-06-27 – 2017-06-28 (×2): 17 g via ORAL
  Filled 2017-06-27 (×2): qty 1

## 2017-06-27 MED ORDER — LIDOCAINE HCL 1 % IJ SOLN
3.00 | INTRAMUSCULAR | Status: DC
Start: ? — End: 2017-06-27

## 2017-06-27 MED ORDER — DIPHENHYDRAMINE HCL 12.5 MG/5ML PO ELIX: 12.5000 mg | ORAL_SOLUTION | Freq: Four times a day (QID) | ORAL | Status: DC | PRN

## 2017-06-27 MED ORDER — OMEGA-3-ACID ETHYL ESTERS 1 G PO CAPS
1.0000 g | ORAL_CAPSULE | Freq: Two times a day (BID) | ORAL | Status: DC
  Administered 2017-06-27 – 2017-07-10 (×26): 1 g via ORAL
  Filled 2017-06-27 (×27): qty 1

## 2017-06-27 MED ORDER — OLOPATADINE HCL 0.2 % OP SOLN
1.00 | OPHTHALMIC | Status: DC
Start: 2017-06-28 — End: 2017-06-27

## 2017-06-27 MED ORDER — FLEET ENEMA 7-19 GM/118ML RE ENEM: 1.0000 | ENEMA | Freq: Once | RECTAL | Status: DC | PRN

## 2017-06-27 MED ORDER — CEFDINIR 300 MG PO CAPS
300.0000 mg | ORAL_CAPSULE | Freq: Two times a day (BID) | ORAL | Status: AC
  Administered 2017-06-27 – 2017-06-30 (×7): 300 mg via ORAL
  Filled 2017-06-27 (×7): qty 1

## 2017-06-27 MED ORDER — ADULT MULTIVITAMIN W/MINERALS CH
1.0000 | ORAL_TABLET | Freq: Every day | ORAL | Status: DC
  Administered 2017-06-28 – 2017-07-10 (×13): 1 via ORAL
  Filled 2017-06-27 (×13): qty 1

## 2017-06-27 MED ORDER — INSULIN ASPART 100 UNIT/ML ~~LOC~~ SOLN: 0.0000 [IU] | Freq: Every day | SUBCUTANEOUS | Status: DC

## 2017-06-27 MED ORDER — ACETAMINOPHEN 325 MG PO TABS
975.00 | ORAL_TABLET | ORAL | Status: DC
Start: 2017-06-27 — End: 2017-06-27

## 2017-06-27 MED ORDER — GABAPENTIN 100 MG PO CAPS
100.0000 mg | ORAL_CAPSULE | Freq: Two times a day (BID) | ORAL | Status: DC
  Administered 2017-06-27 – 2017-07-10 (×26): 100 mg via ORAL
  Filled 2017-06-27 (×26): qty 1

## 2017-06-27 MED ORDER — OXYCODONE HCL 5 MG PO TABS
5.0000 mg | ORAL_TABLET | Freq: Every day | ORAL | Status: DC
  Administered 2017-06-27: 5 mg via ORAL
  Filled 2017-06-27 (×2): qty 1

## 2017-06-27 MED ORDER — AMLODIPINE BESYLATE 10 MG PO TABS
10.00 | ORAL_TABLET | ORAL | Status: DC
Start: 2017-06-28 — End: 2017-06-27

## 2017-06-27 MED ORDER — NON FORMULARY: 3.0000 mg | Freq: Every day | Status: DC

## 2017-06-27 MED ORDER — MAGNESIUM OXIDE 400 (241.3 MG) MG PO TABS
200.0000 mg | ORAL_TABLET | Freq: Two times a day (BID) | ORAL | Status: DC
Start: 2017-06-27 — End: 2017-07-10
  Administered 2017-06-27 – 2017-07-10 (×26): 200 mg via ORAL
  Filled 2017-06-27 (×26): qty 1

## 2017-06-27 MED ORDER — EZETIMIBE 10 MG PO TABS
10.0000 mg | ORAL_TABLET | Freq: Every day | ORAL | Status: DC
Start: 2017-06-28 — End: 2017-07-10
  Administered 2017-06-28 – 2017-07-10 (×13): 10 mg via ORAL
  Filled 2017-06-27 (×13): qty 1

## 2017-06-27 MED ORDER — FLUTICASONE PROPIONATE 50 MCG/ACT NA SUSP
1.0000 | Freq: Every day | NASAL | Status: DC
  Administered 2017-06-28 – 2017-07-10 (×13): 1 via NASAL
  Filled 2017-06-27: qty 16

## 2017-06-27 MED ORDER — OXYCODONE HCL 5 MG PO TABS
5.00 | ORAL_TABLET | ORAL | Status: DC
Start: ? — End: 2017-06-27

## 2017-06-27 MED ORDER — METHOCARBAMOL 500 MG PO TABS
500.0000 mg | ORAL_TABLET | Freq: Four times a day (QID) | ORAL | Status: DC | PRN
  Administered 2017-06-28 – 2017-07-06 (×8): 500 mg via ORAL
  Filled 2017-06-27 (×8): qty 1

## 2017-06-27 MED ORDER — PREMIER PROTEIN SHAKE
11.0000 [oz_av] | Freq: Three times a day (TID) | ORAL | Status: DC
  Administered 2017-06-27 – 2017-07-10 (×30): 11 [oz_av] via ORAL
  Filled 2017-06-27 (×58): qty 325.31

## 2017-06-27 MED ORDER — CEFUROXIME AXETIL 500 MG PO TABS
500.00 | ORAL_TABLET | ORAL | Status: DC
Start: 2017-06-27 — End: 2017-06-27

## 2017-06-27 MED ORDER — TRAMADOL HCL 50 MG PO TABS
50.0000 mg | ORAL_TABLET | Freq: Four times a day (QID) | ORAL | Status: DC | PRN
  Administered 2017-07-06 – 2017-07-10 (×4): 50 mg via ORAL
  Filled 2017-06-27 (×5): qty 1

## 2017-06-27 MED ORDER — LIDOCAINE 5 % EX PTCH
2.00 | MEDICATED_PATCH | CUTANEOUS | Status: DC
Start: 2017-06-28 — End: 2017-06-27

## 2017-06-27 MED ORDER — MAGNESIUM CITRATE PO SOLN
296.00 | ORAL | Status: DC
Start: ? — End: 2017-06-27

## 2017-06-27 MED ORDER — ASPIRIN EC 81 MG PO TBEC
81.0000 mg | DELAYED_RELEASE_TABLET | Freq: Every day | ORAL | Status: DC
  Administered 2017-06-28 – 2017-07-10 (×13): 81 mg via ORAL
  Filled 2017-06-27 (×13): qty 1

## 2017-06-27 MED ORDER — FINASTERIDE 5 MG PO TABS
5.0000 mg | ORAL_TABLET | Freq: Every day | ORAL | Status: DC
  Administered 2017-06-28 – 2017-07-10 (×13): 5 mg via ORAL
  Filled 2017-06-27 (×13): qty 1

## 2017-06-27 MED ORDER — GABAPENTIN 100 MG PO CAPS
100.00 | ORAL_CAPSULE | ORAL | Status: DC
Start: 2017-06-27 — End: 2017-06-27

## 2017-06-27 MED ORDER — VITAMIN D 1000 UNITS PO TABS
2000.0000 [IU] | ORAL_TABLET | Freq: Every day | ORAL | Status: DC
  Administered 2017-06-28 – 2017-07-10 (×13): 2000 [IU] via ORAL
  Filled 2017-06-27 (×13): qty 2

## 2017-06-27 MED ORDER — LISINOPRIL 5 MG PO TABS
5.00 | ORAL_TABLET | ORAL | Status: DC
Start: 2017-06-28 — End: 2017-06-27

## 2017-06-27 MED ORDER — FLUOXETINE HCL 20 MG PO CAPS
60.00 | ORAL_CAPSULE | ORAL | Status: DC
Start: 2017-06-28 — End: 2017-06-27

## 2017-06-27 MED ORDER — LISINOPRIL 5 MG PO TABS
5.0000 mg | ORAL_TABLET | Freq: Every day | ORAL | Status: DC
  Administered 2017-06-28 – 2017-07-04 (×7): 5 mg via ORAL
  Filled 2017-06-27 (×8): qty 1

## 2017-06-27 MED ORDER — MELATONIN 3 MG PO TABS
3.0000 mg | ORAL_TABLET | Freq: Every day | ORAL | Status: DC
  Administered 2017-06-27 – 2017-07-09 (×13): 3 mg via ORAL
  Filled 2017-06-27 (×14): qty 1

## 2017-06-27 MED ORDER — CHOLECALCIFEROL 25 MCG (1000 UT) PO TABS
1000.00 | ORAL_TABLET | ORAL | Status: DC
Start: 2017-06-28 — End: 2017-06-27

## 2017-06-27 MED ORDER — SENNOSIDES-DOCUSATE SODIUM 8.6-50 MG PO TABS
2.00 | ORAL_TABLET | ORAL | Status: DC
Start: ? — End: 2017-06-27

## 2017-06-27 MED ORDER — ALUM & MAG HYDROXIDE-SIMETH 200-200-20 MG/5ML PO SUSP: 30.0000 mL | ORAL | Status: DC | PRN

## 2017-06-27 MED ORDER — GENERIC EXTERNAL MEDICATION
1.00 | Status: DC
Start: ? — End: 2017-06-27

## 2017-06-27 MED ORDER — OXYCODONE HCL 5 MG PO TABS
5.0000 mg | ORAL_TABLET | ORAL | Status: DC | PRN
  Administered 2017-06-27 – 2017-07-06 (×25): 5 mg via ORAL
  Filled 2017-06-27 (×25): qty 1

## 2017-06-27 MED ORDER — LABETALOL HCL 5 MG/ML IV SOLN
10.00 | INTRAVENOUS | Status: DC
Start: ? — End: 2017-06-27

## 2017-06-27 MED ORDER — APIXABAN 5 MG PO TABS
5.00 | ORAL_TABLET | ORAL | Status: DC
Start: 2017-06-27 — End: 2017-06-27

## 2017-06-27 MED ORDER — PROCHLORPERAZINE 25 MG RE SUPP: 12.5000 mg | Freq: Four times a day (QID) | RECTAL | Status: DC | PRN

## 2017-06-27 MED ORDER — GENERIC EXTERNAL MEDICATION
80.00 | Status: DC
Start: ? — End: 2017-06-27

## 2017-06-27 MED ORDER — MELATONIN 3 MG PO TABS
6.00 | ORAL_TABLET | ORAL | Status: DC
Start: 2017-06-27 — End: 2017-06-27

## 2017-06-27 MED ORDER — DEXTROSE 50 % IV SOLN
12.50 | INTRAVENOUS | Status: DC
Start: ? — End: 2017-06-27

## 2017-06-27 NOTE — Progress Notes (Addendum)
Pt arrived via EMS with belongs and wife at bedside.  Pt is alert and oriented x 4, able to make needs known, lungs clear with diminished bases, HR regular, sutures removed from right elbow, left arm with soft splint, midline incision un approximated, replaced with wet to dry and consult to wound care for wound vac.  Right upper leg and hip noted with staples no drainage at this time, right foot fixator with inter medial sutures, placed foam dressing, foley draining clear yellow fluid and was placed by urology, (this is 2nd trial due to pt unable to void). JP drain to left lateral abd draining red fluid. Bulb to manual pressure suction.  sacral dressing to reduce risk of pressure injury. Air mattress obtained. Will continue to monitor.

## 2017-06-27 NOTE — Progress Notes (Signed)
Pt has a home CPAP.  Pt stated he doesn't need any assistance putting it on.
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
## 2017-06-28 ENCOUNTER — Inpatient Hospital Stay (HOSPITAL_COMMUNITY): Payer: BLUE CROSS/BLUE SHIELD | Admitting: Occupational Therapy

## 2017-06-28 ENCOUNTER — Inpatient Hospital Stay (HOSPITAL_COMMUNITY): Payer: BLUE CROSS/BLUE SHIELD | Admitting: Physical Therapy

## 2017-06-28 DIAGNOSIS — G479 Sleep disorder, unspecified: Secondary | ICD-10-CM

## 2017-06-28 DIAGNOSIS — I10 Essential (primary) hypertension: Secondary | ICD-10-CM

## 2017-06-28 DIAGNOSIS — E46 Unspecified protein-calorie malnutrition: Secondary | ICD-10-CM

## 2017-06-28 LAB — CBC WITH DIFFERENTIAL/PLATELET
Abs Immature Granulocytes: 0.1 10*3/uL (ref 0.0–0.1)
BASOS ABS: 0 10*3/uL (ref 0.0–0.1)
BASOS PCT: 0 %
EOS ABS: 0.5 10*3/uL (ref 0.0–0.7)
EOS PCT: 7 %
HCT: 34.4 % — ABNORMAL LOW (ref 39.0–52.0)
Hemoglobin: 10.7 g/dL — ABNORMAL LOW (ref 13.0–17.0)
IMMATURE GRANULOCYTES: 1 %
Lymphocytes Relative: 18 %
Lymphs Abs: 1.2 10*3/uL (ref 0.7–4.0)
MCH: 29.6 pg (ref 26.0–34.0)
MCHC: 31.1 g/dL (ref 30.0–36.0)
MCV: 95.3 fL (ref 78.0–100.0)
Monocytes Absolute: 0.8 10*3/uL (ref 0.1–1.0)
Monocytes Relative: 12 %
NEUTROS PCT: 62 %
Neutro Abs: 4 10*3/uL (ref 1.7–7.7)
PLATELETS: 289 10*3/uL (ref 150–400)
RBC: 3.61 MIL/uL — AB (ref 4.22–5.81)
RDW: 17.6 % — AB (ref 11.5–15.5)
WBC: 6.5 10*3/uL (ref 4.0–10.5)

## 2017-06-28 LAB — COMPREHENSIVE METABOLIC PANEL
ALT: 27 U/L (ref 17–63)
AST: 23 U/L (ref 15–41)
Albumin: 2.6 g/dL — ABNORMAL LOW (ref 3.5–5.0)
Alkaline Phosphatase: 100 U/L (ref 38–126)
Anion gap: 7 (ref 5–15)
BILIRUBIN TOTAL: 1.3 mg/dL — AB (ref 0.3–1.2)
BUN: 16 mg/dL (ref 6–20)
CO2: 25 mmol/L (ref 22–32)
CREATININE: 0.52 mg/dL — AB (ref 0.61–1.24)
Calcium: 8.5 mg/dL — ABNORMAL LOW (ref 8.9–10.3)
Chloride: 103 mmol/L (ref 101–111)
Glucose, Bld: 132 mg/dL — ABNORMAL HIGH (ref 65–99)
POTASSIUM: 4.1 mmol/L (ref 3.5–5.1)
Sodium: 135 mmol/L (ref 135–145)
TOTAL PROTEIN: 6.4 g/dL — AB (ref 6.5–8.1)

## 2017-06-28 LAB — GLUCOSE, CAPILLARY
GLUCOSE-CAPILLARY: 124 mg/dL — AB (ref 65–99)
GLUCOSE-CAPILLARY: 179 mg/dL — AB (ref 65–99)
Glucose-Capillary: 153 mg/dL — ABNORMAL HIGH (ref 65–99)
Glucose-Capillary: 140 mg/dL — ABNORMAL HIGH (ref 65–99)

## 2017-06-28 LAB — MAGNESIUM: MAGNESIUM: 1.7 mg/dL (ref 1.7–2.4)

## 2017-06-28 MED ORDER — PRO-STAT SUGAR FREE PO LIQD
30.0000 mL | Freq: Two times a day (BID) | ORAL | Status: DC
  Administered 2017-06-28 – 2017-07-10 (×25): 30 mL via ORAL
  Filled 2017-06-28 (×23): qty 30

## 2017-06-28 MED ORDER — LIDOCAINE 5 % EX PTCH
1.0000 | MEDICATED_PATCH | CUTANEOUS | Status: DC
  Administered 2017-06-29 – 2017-07-10 (×12): 1 via TRANSDERMAL
  Filled 2017-06-28 (×12): qty 1

## 2017-06-28 MED ORDER — OXYCODONE HCL 5 MG PO TABS
5.0000 mg | ORAL_TABLET | Freq: Every day | ORAL | Status: DC
  Administered 2017-06-28 – 2017-07-09 (×12): 5 mg via ORAL
  Filled 2017-06-28 (×11): qty 1
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
## 2017-06-28 NOTE — Care Management Note (Signed)
[facility] Individual Statement of Services  Patient Name:  Melvin [name]  Date:  06/28/2017  Welcome to the [facility].  Our goal is to provide you with an individualized program based on your diagnosis and situation, designed to meet your specific needs.  With this comprehensive rehabilitation program, you will be expected to participate in at least 3 hours of rehabilitation therapies Monday-Friday, with modified therapy programming on the weekends.  Your rehabilitation program will include the following services:  Physical Therapy (PT), Occupational Therapy (OT), 24 hour per day rehabilitation nursing, Case Management (Social Worker), Rehabilitation Medicine, Nutrition Services and Pharmacy Services  Weekly team conferences will be held on Wednesday to discuss your progress.  Your Social Worker will talk with you frequently to get your input and to update you on team discussions.  Team conferences with you and your family in attendance may also be held.  Expected length of stay: 10-14 days  Overall anticipated outcome: supervision-min-mod level  Depending on your progress and recovery, your program may change. Your Social Worker will coordinate services and will keep you informed of any changes. Your Social Worker's name and contact numbers are listed  below.  The following services may also be recommended but are not provided by the [facility] will be made to provide these services after discharge if needed.  Arrangements include referral to agencies that provide these services.  Your insurance has been verified to be:  [place] Your primary doctor is:  Redge Gainer  Pertinent information will be shared with your doctor and your insurance company.  Social Worker:  Ovidio Kin, [place] or ([phone]  Information discussed with and copy given to patient by: Elease Hashimoto, 06/28/2017, 1:00 PM

## 2017-06-28 NOTE — Consult Note (Signed)
[facility] Nurse wound consult note Reason for Consult: Negative Pressure Wound Therapy (NPWT) in place from [facility].  Upon arrival to IP Rehab unit yesterday afternoon and upon my direction, his bedside RN placed a NS dressing to the midline wound in anticipation of application of NPWT this am. Wound type: Surgical Pressure Injury POA: N/A Measurement:  19 x 5 x 2.2cm with widest and deepest portion at the distal end of incision.  There is a 1cm round area of tissue loss at 3 o'clock that is covered with dried serum (scab) to prevent assessment of depth healing and this wound is healing. The etiology of this tissue injury is not known, but the presentation is consistent with Medical Adhesive Related Skin Injury (MARSI). Wound bed:100% red, moist  Drainage (amount, consistency, odor) small amount of serous exudate Periwound:90% intact.  There is a 1cm round partial thickness tissue loss that is healing and covered with dried serum (scab) to prevent assessment of depth.  Dressing procedure/placement/frequency: Dressing removed using medical adhesive remover wipe and wound cleansed with NS and gently patted dry prior to measuring wound. The patient is premedicated by the bedside RN after reporting that his pain was a 6/10 although I woke patient for this dressing change.  Periwound skin is protected with drape, 1 piece of black foam used to obliterate dead space and this is then covered with drape. The TRAC pad is attached to the dressing and negative pressure is initiated at 142mmHg continuous negative pressure.  An immediate seal is achieved.  Patient tolerated procedure well and expresses appreciation for the change.    I communicate to him and to the bedside RN that I will return on Saturday to change the dressing this week and that he would then return to his M/W/F schedule for NPWT dressing changes. They both express understanding.    Two dressings are in room for the scheduled changes on Saturday and  Monday.  [facility] nursing team will follow, and will remain available to this patient, the nursing and medical teams.   Thanks, Maudie Flakes, MSN, RN, [place], Arther Abbott  Pager# [phone]
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
## 2017-06-28 NOTE — Progress Notes (Signed)
Physical Therapy Session Note  Patient Details  Name: Melvin [name] MRN: [id number] Date of Birth: Jun 07, 1950  Today's Date: 06/28/2017 PT Individual Time: 1415-1530 PT Individual Time Calculation (min): 75 min   Short Term Goals: Week 1:  PT Short Term Goal 1 (Week 1): Pt will perform bed mobility minA supine <>sit  PT Short Term Goal 2 (Week 1): Pt will perform slideboard transfer consistent modA PT Short Term Goal 3 (Week 1): Pt will demo OOB tolerance 4 hours/day outside of therapy sessions PT Short Term Goal 4 (Week 1): Pt will propel w/c x150' with S  Skilled Therapeutic Interventions/Progress Updates: Pt received supine in bed with wife present; c/o pain as below and agreeable to bed level treatment as pt just recently returned to bed d/t discomfort in w/c. Instructed pt in LE ROM/strengthening HEP within WB precautions as below and instructed pt and wife in LLE gastroc/hamstring stretches. Provided with handout of exercises and discussed performance outside of therapy sessions 1x/day.   Exercises  Supine Ankle Pumps (LLE only)- 10 reps - 3 sets - 1x daily - 7x weekly  Supine Gluteal Sets - 10 reps - 3 sets - 1x daily - 7x weekly  Active Straight Leg Raise Advanced (LLE AROM, RLE AAROM)- 10 reps - 3 sets - 1x daily - 7x weekly  Supine Knee Extension Strengthening (RLE only, AAROM>AROM)- 10 reps - 3 sets - 1x daily - 7x weekly  Foot Dorsiflexion PROM Caregiver - 3 sets - 1 min hold - 1x daily - 7x weekly  Supine Hamstring Stretch with Caregiver - 3 sets - 1 min hold - 1x daily - 7x weekly  Seated Long Arc Quad - 10 reps - 3 sets - 1x daily - 7x weekly   Therapist obtained different reclining w/c with smaller width to improve propulsion mechanics and comfort, and provided gel hybrid cushion. Supine>sit with minA RLE management. Again reporting dizziness upon sitting as in AM session. Wife reports h/o vestibular problems prior to this accident, suspect BPPV based on symptoms wife  described. Pt reporting consistent symptoms with sit <>supine and rolling; therapist attempted to observe for nystagmus when returning to supine at end of session however unclear. Will continue to assess and consider treatment depending on mobility status and pain management; discussed with pt/family possibility of needing to be treated outpatient as mobility improves/pain resolves.  Transfer to w/c modA, wife providing assist for maintaining NWB RLE. W/c propulsion x50' with S to demo to wife; increased difficulty steering with LLE d/t higher height of new w/c compared to AM session. Returned to bed [employer]. Sit >supine modA for LE management. Reviewed home measurement sheet with wife. Wife mentions possibility of hiring caregiver to assist at home, will continue to discuss based on pt progress. Remained in bed at end of session, all needs in reach.      Therapy Documentation Precautions:  Restrictions Weight Bearing Restrictions: Yes LUE Weight Bearing: Non weight bearing RLE Weight Bearing: Non weight bearing LLE Weight Bearing: Touchdown weight bearing   See Function Navigator for Current Functional Status.   Therapy/Group: Individual Therapy  Luberta Mutter 06/28/2017, 3:41 PM

## 2017-06-28 NOTE — Plan of Care (Signed)
  Problem: RH SKIN INTEGRITY Goal: RH STG SKIN FREE OF INFECTION/BREAKDOWN Outcome: Progressing  Reposition q 2 hrs.

## 2017-06-29 ENCOUNTER — Inpatient Hospital Stay (HOSPITAL_COMMUNITY): Payer: BLUE CROSS/BLUE SHIELD | Admitting: Physical Therapy

## 2017-06-29 ENCOUNTER — Inpatient Hospital Stay (HOSPITAL_COMMUNITY): Payer: BLUE CROSS/BLUE SHIELD | Admitting: Occupational Therapy

## 2017-06-29 ENCOUNTER — Telehealth: Payer: Self-pay | Admitting: Family Medicine

## 2017-06-29 DIAGNOSIS — K5903 Drug induced constipation: Secondary | ICD-10-CM

## 2017-06-29 LAB — GLUCOSE, CAPILLARY
GLUCOSE-CAPILLARY: 151 mg/dL — AB (ref 65–99)
GLUCOSE-CAPILLARY: 137 mg/dL — AB (ref 65–99)
GLUCOSE-CAPILLARY: 139 mg/dL — AB (ref 65–99)
Glucose-Capillary: 139 mg/dL — ABNORMAL HIGH (ref 65–99)

## 2017-06-29 MED ORDER — ACETAMINOPHEN 325 MG PO TABS
650.0000 mg | ORAL_TABLET | Freq: Three times a day (TID) | ORAL | Status: DC
  Administered 2017-06-29 – 2017-07-10 (×43): 650 mg via ORAL
  Filled 2017-06-29 (×42): qty 2

## 2017-06-29 MED ORDER — POLYETHYLENE GLYCOL 3350 17 G PO PACK
17.0000 g | PACK | Freq: Two times a day (BID) | ORAL | Status: DC
Start: 2017-06-29 — End: 2017-07-10
  Administered 2017-06-29 – 2017-07-09 (×12): 17 g via ORAL
  Filled 2017-06-29 (×16): qty 1

## 2017-06-29 MED ORDER — SENNOSIDES-DOCUSATE SODIUM 8.6-50 MG PO TABS: 2.0000 | ORAL_TABLET | Freq: Every evening | ORAL | Status: DC | PRN

## 2017-06-29 MED ORDER — SENNOSIDES-DOCUSATE SODIUM 8.6-50 MG PO TABS: 1.0000 | ORAL_TABLET | Freq: Every day | ORAL | Status: DC

## 2017-06-29 MED ORDER — LIDOCAINE HCL URETHRAL/MUCOSAL 2 % EX GEL
CUTANEOUS | Status: DC | PRN
  Filled 2017-06-29 (×9): qty 5

## 2017-06-29 MED ORDER — POLYETHYLENE GLYCOL 3350 17 G PO PACK: 17.0000 g | PACK | Freq: Every day | ORAL | Status: DC

## 2017-06-29 NOTE — Progress Notes (Signed)
Physical Therapy Session Note  Patient Details  Name: Melvin [name] MRN: [id number] Date of Birth: 07/15/1950  Today's Date: 06/29/2017   Skilled Therapeutic Interventions/Progress Updates: Scheduling error; pt and nursing staff unaware of scheduled therapy session; upon therapist's arrival pt had just been transferred back to bed by OT and nursing staff was preparing to perform I&O cath. Missed 30 min session. Continue to follow per POC.     Therapy Documentation Precautions:  Precautions Precautions: Fall, Other (comment) Precaution Comments: NWB LUE; NWB RLE; TDWB LLE for transfers only Restrictions Weight Bearing Restrictions: Yes LUE Weight Bearing: Non weight bearing RLE Weight Bearing: Non weight bearing LLE Weight Bearing: Touchdown weight bearing General: PT Amount of Missed Time (min): 30 Minutes PT Missed Treatment Reason: Nursing care   See Function Navigator for Current Functional Status.   Benjiman Core Tygielski 06/29/2017, 3:31 PM

## 2017-06-29 NOTE — Progress Notes (Signed)
Occupational Therapy Session Note  Patient Details  Name: Melvin [name] MRN: [id number] Date of Birth: 20-Jun-1950  Today's Date: 06/29/2017 OT Individual Time: 1400-1450 OT Individual Time Calculation (min): 50 min    Short Term Goals: Week 1:  OT Short Term Goal 1 (Week 1): STGS equal to LTGs based on ELOS.  See Care Plan for specific details  Skilled Therapeutic Interventions/Progress Updates:    Pt completed transfer from bedside recliner to bed with sliding board and max assist.  He is able to lean to the left side for placement of board but still needs max assist to maintain wieightbearing precautions on the LUE.  Once on the bed, he needed min assist for transition from sitting to supine.  Discussed expectations of needing mod assist for toileting as well as min assist for transfers with pt and spouse.  Recommend hospital bed for ADLs based on precautions at this time.  Pt may also need hoyer for safe transfers.  She reports planning on getting someone to come in and help at home as well.  Will continue with current treatment plan.  Call button and phone in reach  Therapy Documentation Precautions:  Precautions Precautions: Fall, Other (comment) Precaution Comments: NWB LUE; NWB RLE; TDWB LLE for transfers only Restrictions Weight Bearing Restrictions: Yes LUE Weight Bearing: Non weight bearing RLE Weight Bearing: Non weight bearing LLE Weight Bearing: Touchdown weight bearing  Pain: Pain Assessment Pain Scale: Faces Pain Score: 5  Faces Pain Scale: Hurts a little bit Pain Type: Acute pain Pain Location: Back Pain Orientation: Lower Pain Descriptors / Indicators: Discomfort Pain Frequency: Constant Pain Onset: Unable to tell Pain Intervention(s): Medication (See eMAR) ADL:  See Function Navigator for Current Functional Status.   Therapy/Group: Individual Therapy  Yoshimura Dunlap OTR/L 06/29/2017, 4:29 PM
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
## 2017-06-29 NOTE — Progress Notes (Addendum)
[place] PHYSICAL MEDICINE & REHABILITATION     PROGRESS NOTE  Subjective/Complaints:  Patient seen lying bed this morning. She slept well overnight. He states he had a fair day therapies yesterday because some issues with his wound VAC. Patient constipated per nursing.  ROS: denies CP, SOB, nausea, vomiting, diarrhea.  Objective: Vital Signs: Blood pressure (!) 141/75, pulse 75, temperature 97.7 F (36.5 C), temperature source Oral, resp. rate 12, height 6' (1.829 m), weight 88.5 kg (195 lb), SpO2 98 %. No results found. Recent Labs    06/28/17 0724  WBC 6.5  HGB 10.7*  HCT 34.4*  PLT 289   Recent Labs    06/28/17 0724  NA 135  K 4.1  CL 103  GLUCOSE 132*  BUN 16  CREATININE 0.52*  CALCIUM 8.5*   CBG (last 3)  Recent Labs    06/28/17 1637 06/28/17 2122 06/29/17 0639  GLUCAP 140* 179* 139*    Wt Readings from Last 3 Encounters:  06/27/17 88.5 kg (195 lb)  06/26/17 92.1 kg (203 lb)  02/19/17 95.2 kg (209 lb 12.8 oz)    Physical Exam:  BP (!) 141/75 (BP Location: Right Wrist) Comment: rn notified   Pulse 75   Temp 97.7 F (36.5 C) (Oral)   Resp 12   Ht 6' (1.829 m)   Wt 88.5 kg (195 lb)   SpO2 98%   BMI 26.45 kg/m  Constitutional: He appears well-developed and well-nourished. NAD. HENT: Normocephalic and atraumatic.  Eyes: EOM are normal. No discharge.  Cardiovascular: RRR. No JVD. Respiratory: Effort normal and breath sounds normal.  GI: Bowel sounds are normal. Nondistended. Musculoskeletal: Ring fixator in place on right ankle. LUE with long arm splint.  Neurological: He is alert and oriented.  Motor: Right upper extremity: 4/5 proximal distal Left upper extremity: Shoulder abduction 4/5 elbow and wrist braced, hand grip 3/5 Left lower extremity: Hip flexion, knee extension, ankle dorsiflexion 4 -/5 Right lower extremity: Hip flexion, knee extension 2/5, wiggles toes (stable) Skin:  Incision right thigh and right flank C/D/I Right elbow  abrasions with dressing C/D/I.  Abdominal wound with VAC Psychiatric: He has a normal mood and affect. His behavior is normal. Thought content normal.    Assessment/Plan: 1. Functional deficits secondary to Polytrauma which require 3+ hours per day of interdisciplinary therapy in a comprehensive inpatient rehab setting. Physiatrist is providing close team supervision and 24 hour management of active medical problems listed below. Physiatrist and rehab team continue to assess barriers to discharge/monitor patient progress toward functional and medical goals.  Function:  Bathing Bathing position   Position: Bed  Bathing parts Body parts bathed by patient: Right arm, Left arm, Chest, Abdomen, Front perineal area, Right upper leg, Left upper leg Body parts bathed by helper: Left lower leg, Back, Buttocks  Bathing assist        Upper Body Dressing/Undressing Upper body dressing   What is the patient wearing?: Pull over shirt/dress     Pull over shirt/dress - Perfomed by patient: Thread/unthread left sleeve, Thread/unthread right sleeve, Put head through opening Pull over shirt/dress - Perfomed by helper: Pull shirt over trunk        Upper body assist        Lower Body Dressing/Undressing Lower body dressing   What is the patient wearing?: Pants, Non-skid slipper socks       Pants- Performed by helper: Thread/unthread right pants leg, Thread/unthread left pants leg, Pull pants up/down   Non-skid slipper socks- Performed  by helper: Don/doff left sock(right sock NA secondary to external fixator)                  Lower body assist        Toileting Toileting Toileting activity did not occur: Safety/medical concerns        Toileting assist     Transfers Chair/bed transfer   Chair/bed transfer method: Lateral scoot Chair/bed transfer assist level: Maximal assist (Pt 25 - 49%/lift and lower) Chair/bed transfer assistive device: Armrests, Sliding board      Locomotion Ambulation Ambulation activity did not occur: Safety/medical concerns(WB precautions)         Wheelchair   Type: Manual Max wheelchair distance: 75 Assist Level: Supervision or verbal cues  Cognition Comprehension Comprehension assist level: Follows complex conversation/direction with no assist  Expression Expression assist level: Expresses complex ideas: With no assist  Social Interaction Social Interaction assist level: Interacts appropriately with others - No medications needed.  Problem Solving Problem solving assist level: Solves complex problems: Recognizes & self-corrects  Memory Memory assist level: Complete Independence: No helper    Medical Problem List and Plan: 1. Deficits with mobility, transfers, self-care secondary to polytrauma.   Continue CIR 2. DVT Prophylaxis/Anticoagulation: Pharmaceutical: Other (comment)--Eliquis 3. Pain Management: tylenol and/or oxycodone prn effective.  4. Mood: LCSW to follow for evaluation and support.  5. Neuropsych: This patient is capable of making decisions on his own behalf. 6. Skin/Wound Care: Wound VAC to midline incision--change MWF. Remove right elbow sutures as have been grown over. Daily drain care with sterile flushes--to stay in place till follow up at [facility] on 6/4? 7. Fluids/Electrolytes/Nutrition: Monitor I/O.  8. Klebsiella UTI:   Changed over to po antibiotics on 5/22, continue for omnicef thorough 5/26.  9. T2DM: On metformin daily--continue to monitor BS ac/hs and use SSI for tighter control.    Appears to be improving on 5/4 10. H/o depression: Managed with prozac. Has good outlookat present. 11. ABLA:    Hemoglobin 10.7 on 5/23  Continue to monitor 12. AKI: Resolved   Encourage fluid intake.    Creatinine 0.52 on 5/23 13. Hypomagnesemia: added mag ox for supplement.    Magnesium 1.7 on 5/23   Continue to monitor 14. Sleep disturbance  Cont Melatonin 15. Hypertension  Continue lisinopril 5 mg  daily  Labile on 5/24 16. Hypoalbuminemia  Supplement initiated on 5/23 17. Constipation  Bowel regiment increased on 5/24  LOS (Days) 2 A FACE TO FACE EVALUATION WAS PERFORMED  Deshayla Empson Lorie Phenix 06/29/2017 8:59 AM

## 2017-06-29 NOTE — Progress Notes (Signed)
Patient's wife demanded that her husband wound vac dsg to be changed last night she said because her husband wound vac was off for more than 2 hrs. RN told her that the dsg was changed the same day by wound care nurse but she insisted for the dsg to be changed, this RN changed the dsg and patient tolerated the dsg change. We continue to monitor.

## 2017-06-29 NOTE — Progress Notes (Signed)
Patient has home CPAP and places himself on when ready. RT will continue to monitor.

## 2017-06-29 NOTE — Telephone Encounter (Signed)
Calling to see if patient has had Pneumonia vaccine. Please advise.

## 2017-06-29 NOTE — Telephone Encounter (Signed)
Pt wife aware - no records of pneumonia - [facility] will give

## 2017-06-29 NOTE — Progress Notes (Signed)
Physical Therapy Session Note  Patient Details  Name: Melvin [name] MRN: [id number] Date of Birth: [birth year]  Today's Date: 06/29/2017 PT Individual Time: 1100-1200 PT Individual Time Calculation (min): 60 min   Short Term Goals: Week 1:  PT Short Term Goal 1 (Week 1): Pt will perform bed mobility minA supine <>sit  PT Short Term Goal 2 (Week 1): Pt will perform slideboard transfer consistent modA PT Short Term Goal 3 (Week 1): Pt will demo OOB tolerance 4 hours/day outside of therapy sessions PT Short Term Goal 4 (Week 1): Pt will propel w/c x150' with S  Skilled Therapeutic Interventions/Progress Updates: Pt received seated in w/c, reports discomfort sitting in w/c but was able to adjust hips and find comfortable position. Pt reports discussion with PA regarding sitting up in recliner; planned to transition to recliner at end of session to assess tolerance. W/c propulsion modA with RUE and therapist assisting with steering d/t difficulty reaching LLE to floor. ModA slideboard transfer w/c <>mat table with cues for technique and assist to maintain NWB RLE. Seated long arc quads x10 reps BLE. Sit >supine minA for RLE management. Supine short arc quads x10 reps BLE. RUE chest press, tricep extension 3x15 reps with 6# dumbbell. Supine>sit minA for RLE management. Lateral scoot transfer w/c >recliner min/modA with slight downhill setup. Returned to room totalA in recliner. Reviewed weekend schedule and plan to begin trial'ing car beginning of next week. Remained in recliner, all needs in reach.     Therapy Documentation Precautions:  Precautions Precautions: Fall, Other (comment) Precaution Comments: NWB LUE; NWB RLE; TDWB LLE for transfers only Restrictions Weight Bearing Restrictions: Yes LUE Weight Bearing: Non weight bearing RLE Weight Bearing: Non weight bearing LLE Weight Bearing: Touchdown weight bearing Pain: Pain Assessment Pain Scale: 0-10 Pain Score: 7  Pain Type: Acute  pain;Surgical pain Pain Location: Coccyx Pain Orientation: Left Pain Descriptors / Indicators: Aching Pain Frequency: Constant Pain Onset: On-going Pain Intervention(s): Medication (See eMAR)   See Function Navigator for Current Functional Status.   Therapy/Group: Individual Therapy  Luberta Mutter 06/29/2017, 12:02 PM

## 2017-06-29 NOTE — Progress Notes (Signed)
Occupational Therapy Session Note  Patient Details  Name: Melvin [name] MRN: [id number] Date of Birth: November 16, 1950  Today's Date: 06/29/2017 OT Individual Time: 0902-1000 OT Individual Time Calculation (min): 58 min    Short Term Goals: Week 1:  OT Short Term Goal 1 (Week 1): STGS equal to LTGs based on ELOS.  See Care Plan for specific details  Skilled Therapeutic Interventions/Progress Updates:    Pt completed bathing and dressing supine to sit EOB.  Most bathing completed in supine secondary to pt needing to donn brace before transferring to the edge of bed and not being able to stand.  He was able to wash his UB in supine with HOB elevated and donn pullover shirt except for pulling it down in the back.  Rolled in bed for therapist to wash his back and pull down the shirt.  He needed total assist for donning LSO in supine with min assist for all aspects of rolling.  Therapist assisted with cleaning up after BM and donning new brief with rolling as well.  Min assist for transition to sitting from sidelying to work on washing his upper legs and left lower leg.  He utilized LH sponge for washing the left foot with use of the reacher for drying and donning shorts over the foot as well.  He attempted to use the reacher for donning shorts over the RLE as well but external fixator was too bulky.  Total assist for gripper sock secondary to decreased time.  Max assist for sliding board transfer to the reclining wheelchair. Roho cushion in place for comfort as well.  Pt left in wheelchair at bedside with call button and phone in reach to conclude session.    Therapy Documentation Precautions:  Precautions Precautions: Fall, Other (comment) Precaution Comments: NWB LUE; NWB RLE; TDWB LLE for transfers only Restrictions Weight Bearing Restrictions: Yes LUE Weight Bearing: Non weight bearing RLE Weight Bearing: Non weight bearing LLE Weight Bearing: Touchdown weight bearing  Pain: Pain  Assessment Pain Scale: 0-10 Pain Score: 7  Pain Type: Acute pain;Surgical pain Pain Location: Coccyx Pain Orientation: Left Pain Descriptors / Indicators: Aching Pain Frequency: Constant Pain Onset: On-going Pain Intervention(s): Medication (See eMAR) ADL: See Function Navigator for Current Functional Status.   Therapy/Group: Individual Therapy  Mardie Kellen OTR/L 06/29/2017, 10:52 AM

## 2017-06-30 ENCOUNTER — Inpatient Hospital Stay (HOSPITAL_COMMUNITY): Payer: BLUE CROSS/BLUE SHIELD | Admitting: Occupational Therapy

## 2017-06-30 ENCOUNTER — Inpatient Hospital Stay (HOSPITAL_COMMUNITY): Payer: BLUE CROSS/BLUE SHIELD | Admitting: Physical Therapy

## 2017-06-30 DIAGNOSIS — I10 Essential (primary) hypertension: Secondary | ICD-10-CM

## 2017-06-30 DIAGNOSIS — F321 Major depressive disorder, single episode, moderate: Secondary | ICD-10-CM

## 2017-06-30 DIAGNOSIS — E119 Type 2 diabetes mellitus without complications: Secondary | ICD-10-CM

## 2017-06-30 DIAGNOSIS — B961 Klebsiella pneumoniae [K. pneumoniae] as the cause of diseases classified elsewhere: Secondary | ICD-10-CM

## 2017-06-30 DIAGNOSIS — N179 Acute kidney failure, unspecified: Secondary | ICD-10-CM

## 2017-06-30 DIAGNOSIS — N390 Urinary tract infection, site not specified: Secondary | ICD-10-CM

## 2017-06-30 DIAGNOSIS — E46 Unspecified protein-calorie malnutrition: Secondary | ICD-10-CM

## 2017-06-30 DIAGNOSIS — K5903 Drug induced constipation: Secondary | ICD-10-CM

## 2017-06-30 LAB — GLUCOSE, CAPILLARY
Glucose-Capillary: 136 mg/dL — ABNORMAL HIGH (ref 65–99)
Glucose-Capillary: 137 mg/dL — ABNORMAL HIGH (ref 65–99)
Glucose-Capillary: 112 mg/dL — ABNORMAL HIGH (ref 65–99)
Glucose-Capillary: 130 mg/dL — ABNORMAL HIGH (ref 65–99)

## 2017-06-30 NOTE — Progress Notes (Signed)
[place] PHYSICAL MEDICINE & REHABILITATION     PROGRESS NOTE  Subjective/Complaints:   Multiple questions related to urinary retention , Catheterization, risk of delirium, IV vs po abx  ROS: denies CP, SOB, nausea, vomiting, diarrhea.  Objective: Vital Signs: Blood pressure 131/64, pulse 72, temperature 98.4 F (36.9 C), temperature source Oral, resp. rate 15, height 6' (1.829 m), weight 88.5 kg (195 lb), SpO2 100 %. No results found. Recent Labs    06/28/17 0724  WBC 6.5  HGB 10.7*  HCT 34.4*  PLT 289   Recent Labs    06/28/17 0724  NA 135  K 4.1  CL 103  GLUCOSE 132*  BUN 16  CREATININE 0.52*  CALCIUM 8.5*   CBG (last 3)  Recent Labs    06/29/17 1637 06/29/17 2105 06/30/17 0625  GLUCAP 137* 139* 136*    Wt Readings from Last 3 Encounters:  06/27/17 88.5 kg (195 lb)  06/26/17 92.1 kg (203 lb)  02/19/17 95.2 kg (209 lb 12.8 oz)    Physical Exam:  BP 131/64 (BP Location: Right Arm)   Pulse 72   Temp 98.4 F (36.9 C) (Oral)   Resp 15   Ht 6' (1.829 m)   Wt 88.5 kg (195 lb)   SpO2 100%   BMI 26.45 kg/m  Constitutional: He appears well-developed and well-nourished. NAD. HENT: Normocephalic and atraumatic.  Eyes: EOM are normal. No discharge.  Cardiovascular: RRR. No JVD. Respiratory: Effort normal and breath sounds normal.  GI: Bowel sounds are normal. Nondistended. Musculoskeletal: Ring fixator in place on right ankle. LUE with long arm splint.  Neurological: He is alert and oriented.  Motor: Right upper extremity: 4/5 proximal distal Left upper extremity: Shoulder abduction 4/5 elbow and wrist braced, hand grip 3/5 Left lower extremity: Hip flexion, knee extension, ankle dorsiflexion 4 -/5 Right lower extremity: Hip flexion, knee extension 2/5, wiggles toes (stable) Skin:  Incision right thigh and right flank C/D/I Right elbow abrasions with dressing C/D/I.  Abdominal wound with VAC Psychiatric: He has a normal mood and affect. His  behavior is normal. Thought content normal.    Assessment/Plan: 1. Functional deficits secondary to Polytrauma which require 3+ hours per day of interdisciplinary therapy in a comprehensive inpatient rehab setting. Physiatrist is providing close team supervision and 24 hour management of active medical problems listed below. Physiatrist and rehab team continue to assess barriers to discharge/monitor patient progress toward functional and medical goals.  Function:  Bathing Bathing position   Position: Bed  Bathing parts Body parts bathed by patient: Right arm, Left arm, Chest, Abdomen, Front perineal area, Right upper leg, Left upper leg, Left lower leg Body parts bathed by helper: Back, Buttocks  Bathing assist        Upper Body Dressing/Undressing Upper body dressing   What is the patient wearing?: Pull over shirt/dress, Orthosis     Pull over shirt/dress - Perfomed by patient: Thread/unthread left sleeve, Thread/unthread right sleeve, Put head through opening Pull over shirt/dress - Perfomed by helper: Pull shirt over trunk     Orthosis activity level: Performed by helper  Upper body assist        Lower Body Dressing/Undressing Lower body dressing   What is the patient wearing?: Pants, Non-skid slipper socks     Pants- Performed by patient: Thread/unthread left pants leg Pants- Performed by helper: Thread/unthread right pants leg, Pull pants up/down   Non-skid slipper socks- Performed by helper: Don/doff left sock  Lower body assist        Toileting Toileting Toileting activity did not occur: Safety/medical concerns        Toileting assist     Transfers Chair/bed transfer   Chair/bed transfer method: Lateral scoot Chair/bed transfer assist level: Maximal assist (Pt 25 - 49%/lift and lower) Chair/bed transfer assistive device: Armrests, Sliding board     Locomotion Ambulation Ambulation activity did not occur: Safety/medical  concerns(WB precautions)         Wheelchair   Type: Manual Max wheelchair distance: 75 Assist Level: Supervision or verbal cues  Cognition Comprehension Comprehension assist level: Understands complex 90% of the time/cues 10% of the time  Expression Expression assist level: Expresses complex 90% of the time/cues < 10% of the time  Social Interaction Social Interaction assist level: Interacts appropriately with others with medication or extra time (anti-anxiety, antidepressant).  Problem Solving Problem solving assist level: Solves basic problems with no assist  Memory Memory assist level: More than reasonable amount of time    Medical Problem List and Plan: 1. Deficits with mobility, transfers, self-care secondary to polytrauma.   Continue CIR, very limited by weight bearing prec 2. DVT Prophylaxis/Anticoagulation: Pharmaceutical: Other (comment)--Eliquis 3. Pain Management: tylenol and/or oxycodone prn effective.  4. Mood: LCSW to follow for evaluation and support.  5. Neuropsych: This patient is capable of making decisions on his own behalf. 6. Skin/Wound Care: Wound VAC to midline incision--change MWF. Remove right elbow sutures as have been grown over. Daily drain care with sterile flushes--to stay in place till follow up at [facility] on 6/4? 7. Fluids/Electrolytes/Nutrition: Monitor I/O.  8. Klebsiella UTI:   Changed over to po antibiotics on 5/22, continue for omnicef thorough 5/26.  9. T2DM: On metformin daily--continue to monitor BS ac/hs and use SSI for tighter control.    Controlled 5/25 10. H/o depression: Managed with prozac. Has good outlookat present. 11. ABLA:    Hemoglobin 10.7 on 5/23  Continue to monitor 12. AKI: Resolved   Encourage fluid intake.    Creatinine 0.52 on 5/23 13. Hypomagnesemia: added mag ox for supplement.    Magnesium 1.7 on 5/23   Continue to monitor 14. Sleep disturbance  Cont Melatonin 15. Hypertension  Continue lisinopril 5 mg  daily   Vitals:   06/29/17 2004 06/30/17 0420  BP: 138/72 131/64  Pulse: 83 72  Resp: 15 15  Temp: 98 F (36.7 C) 98.4 F (36.9 C)  SpO2: 98% 100%  Controlled 5/25 16. Hypoalbuminemia  Supplement initiated on 5/23 17. Constipation  Bowel regiment increased on 5/24 18.  Urinary retention with UTI- Omnicef LOS (Days) 3 A FACE TO FACE EVALUATION WAS PERFORMED  Charlett Blake 06/30/2017 6:55 AM

## 2017-06-30 NOTE — Progress Notes (Addendum)
Physical Therapy Session Note  Patient Details  Name: Melvin [name] MRN: [id number] Date of Birth: 05/10/1950  Today's Date: 06/30/2017 PT Individual Time: 1515-1625 PT Individual Time Calculation (min): 70 min   Short Term Goals: Week 1:  PT Short Term Goal 1 (Week 1): Pt will perform bed mobility minA supine <>sit  PT Short Term Goal 2 (Week 1): Pt will perform slideboard transfer consistent modA PT Short Term Goal 3 (Week 1): Pt will demo OOB tolerance 4 hours/day outside of therapy sessions PT Short Term Goal 4 (Week 1): Pt will propel w/c x150' with S  Skilled Therapeutic Interventions/Progress Updates:    c/o pain "pretty bad" but does not rate.  Declines out of bed mobility 2/2 same, but agreeable to bed level therex and EOB for brief time.    PT provided pt with ROHO Quadtro cushion to facilitate improved sitting tolerance when out of bed; adjusted so airflow would protect coccyx fx.  Provided education to pt throughout session regarding positioning, pressure relief, and edema management throughout session.  Pt completes RUE therex with 6# weight scap punches and tricep extensions 2x10 reps, BLE therex with A/AROM 2x10 reps SAQ and hip/knee flexion.  Rolling for donning LSO with min assist for RLE, supine to/from sit with same.  Pt able to tolerate sitting EOB ~8 minutes before requesting to return to supine 2/2 pain.  While sitting completed 10 reps LAQ with 5 second hold for hamstring stretch.  Cues throughout all therex for breathing, pain control, and pacing.  Pt returned to supine at end of session, positioned to comfort with call bell in reach and needs met.   Therapy Documentation Precautions:  Precautions Precautions: Fall, Other (comment) Precaution Comments: NWB LUE; NWB RLE; TDWB LLE for transfers only Restrictions Weight Bearing Restrictions: Yes LUE Weight Bearing: Non weight bearing RLE Weight Bearing: Non weight bearing LLE Weight Bearing: Touchdown weight  bearing   See Function Navigator for Current Functional Status.   Therapy/Group: Individual Therapy  Caitlin E Warren 06/30/2017, 4:33 PM  

## 2017-06-30 NOTE — Progress Notes (Signed)
Occupational Therapy Session Note  Patient Details  Name: Melvin [name] MRN: [id number] Date of Birth: [birth year]  Today's Date: 06/30/2017 OT Individual Time: 0922-1019 OT Individual Time Calculation (min): 57 min    Short Term Goals: Week 1:  OT Short Term Goal 1 (Week 1): STGS equal to LTGs based on ELOS.  See Care Plan for specific details  Skilled Therapeutic Interventions/Progress Updates:    Pt completed bathing and dressing supine in bed during session.  He was able to complete all UB bathing with supervision supine with HOB elevated, except for washing his back, which therapist assisted when pt was in sidelying.  He was able to roll side to side using rails with min guard assist while therapist assisted with washing peri area and then donning new brief.  He was able to donn pullover shirt over his head and arms but needed max assist for pulling it down in the back.  Therapist assisted with washing the left lower leg secondary to pt needing to stay in bed for cath after completion of session.  Pt performed shaving with electric razor and min assist for thoroughness secondary to not having mirror available.  Call button and phone in reach at end of session.    Session 2: (1102-1200)  Pt finished donning LB clothing in supine.  He was able to use the reacher for donning shorts over the LLE with min assist but needed total assist for donning them over the RLE.  He was able to roll side to side with min assist and work on pulling the shorts over his hips with mod facilitation.  He then transitioned to sitting EOB where he worked on donning the left sock with use of the sockaide.  He was able to complete with supervision after therapist demonstrated use.  Next had pt transfer to the drop arm commode with max facilitation using the sliding board.  Once on the seat he worked on Office manager with lateral leans side to side.  Max facilitation also needed for pushing shorts down and pulling them up.   Finished session with transfer to the reclining wheelchair with max assist via sliding board at slightly uphill angle.  Pt left up in wheelchair with call button and phone in reach.    Therapy Documentation Precautions:  Precautions Precautions: Fall, Other (comment) Precaution Comments: NWB LUE; NWB RLE; TDWB LLE for transfers only Restrictions Weight Bearing Restrictions: Yes LUE Weight Bearing: Non weight bearing RLE Weight Bearing: Non weight bearing LLE Weight Bearing: Touchdown weight bearing   Pain: Pain Assessment Pain Scale: Faces Pain Score: 5  Faces Pain Scale: Hurts a little bit Pain Type: Acute pain Pain Location: Back Pain Orientation: Lower Pain Descriptors / Indicators: Aching;Discomfort Pain Onset: With Activity Pain Intervention(s): Emotional support;Repositioned ADL: See Function Navigator for Current Functional Status.   Therapy/Group: Individual Therapy  Elveria Lauderbaugh OTR/L 06/30/2017, 12:19 PM

## 2017-07-01 ENCOUNTER — Inpatient Hospital Stay (HOSPITAL_COMMUNITY): Payer: BLUE CROSS/BLUE SHIELD | Admitting: Occupational Therapy

## 2017-07-01 LAB — GLUCOSE, CAPILLARY
Glucose-Capillary: 126 mg/dL — ABNORMAL HIGH (ref 65–99)
Glucose-Capillary: 123 mg/dL — ABNORMAL HIGH (ref 65–99)
Glucose-Capillary: 119 mg/dL — ABNORMAL HIGH (ref 65–99)
Glucose-Capillary: 130 mg/dL — ABNORMAL HIGH (ref 65–99)

## 2017-07-01 MED ORDER — BETHANECHOL CHLORIDE 10 MG PO TABS
10.0000 mg | ORAL_TABLET | Freq: Three times a day (TID) | ORAL | Status: DC
  Administered 2017-07-01 – 2017-07-10 (×28): 10 mg via ORAL
  Filled 2017-07-01 (×28): qty 1

## 2017-07-01 MED ORDER — TAMSULOSIN HCL 0.4 MG PO CAPS
0.4000 mg | ORAL_CAPSULE | Freq: Every day | ORAL | Status: DC
  Administered 2017-07-01 – 2017-07-03 (×3): 0.4 mg via ORAL
  Filled 2017-07-01 (×3): qty 1

## 2017-07-01 NOTE — Progress Notes (Signed)
Occupational Therapy Session Note  Patient Details  Name: DEUNTA BENEKE MRN: [id number] Date of Birth: 1950/10/18  Today's Date: 07/01/2017 OT Individual Time:  - 0915-1000  (45 min)      Short Term Goals: Week 1:  OT Short Term Goal 1 (Week 1): STGS equal to LTGs based on ELOS.  See Care Plan for specific details :     Skilled Therapeutic Interventions/Progress Updates:    Performed bathing and dressing supine per pt request to not get up until after lunch.  Ppt rolled to left with SBA and to right with min assist.  Performed bathing and dressing supine.  Instructed ppt on lifting UB up in bed for exercises 5 times and lifting LLE and reaching with RUE e 5imes during day.  Practiced during session and pt able to return with no verbal or physical assistance.    Therapy Documentation Precautions:  Precautions Precautions: Fall, Other (comment) Precaution Comments: NWB LUE; NWB RLE; TDWB LLE for transfers only Restrictions Weight Bearing Restrictions: Yes LUE Weight Bearing: Non weight bearing RLE Weight Bearing: Non weight bearing LLE Weight Bearing: Touchdown weight bearing General:   Vital Signs:   Pain: Pain Assessment Pain Scale: 0-10 Pain Score: 5  Pain Type: Chronic pain Pain Location: Back Pain Orientation: Lower Pain Radiating Towards: R knee, R leg, foot Pain Descriptors / Indicators: Aching Pain Frequency: Intermittent Pain Onset: On-going Pain Intervention(s): Medication (See eMAR) ADL:     Function Navigator for Current Functional Status.   Therapy/Group: Individual Therapy  Lisa Roca 07/01/2017, 9:57 AM

## 2017-07-01 NOTE — Progress Notes (Signed)
[place] PHYSICAL MEDICINE & REHABILITATION     PROGRESS NOTE  Subjective/Complaints:   Discussed ICP with RN, needs coude to complete high volume cath last noc  ROS: denies CP, SOB, nausea, vomiting, diarrhea.  Objective: Vital Signs: Blood pressure 139/70, pulse 76, temperature 98.2 F (36.8 C), temperature source Oral, resp. rate 16, height 6' (1.829 m), weight 88.5 kg (195 lb), SpO2 97 %. No results found. Recent Labs    06/28/17 0724  WBC 6.5  HGB 10.7*  HCT 34.4*  PLT 289   Recent Labs    06/28/17 0724  NA 135  K 4.1  CL 103  GLUCOSE 132*  BUN 16  CREATININE 0.52*  CALCIUM 8.5*   CBG (last 3)  Recent Labs    06/30/17 2054 06/30/17 2104 07/01/17 0617  GLUCAP <10* 130* 126*    Wt Readings from Last 3 Encounters:  06/27/17 88.5 kg (195 lb)  06/26/17 92.1 kg (203 lb)  02/19/17 95.2 kg (209 lb 12.8 oz)    Physical Exam:  BP 139/70 (BP Location: Right Arm)   Pulse 76   Temp 98.2 F (36.8 C) (Oral)   Resp 16   Ht 6' (1.829 m)   Wt 88.5 kg (195 lb)   SpO2 97%   BMI 26.45 kg/m  Constitutional: He appears well-developed and well-nourished. NAD. HENT: Normocephalic and atraumatic.  Eyes: EOM are normal. No discharge.  Cardiovascular: RRR. No JVD. Respiratory: Effort normal and breath sounds normal.  GI: Bowel sounds are normal. Nondistended. Musculoskeletal: Ring fixator in place on right ankle. LUE with long arm splint.  Neurological: He is alert and oriented.  Motor: Right upper extremity: 4/5 proximal distal Left upper extremity: Shoulder abduction 4/5 elbow and wrist braced, hand grip 3/5 Left lower extremity: Hip flexion, knee extension, ankle dorsiflexion 4 -/5 Right lower extremity: Hip flexion, knee extension 2/5, wiggles toes (stable) Skin:  Incision right thigh and right flank C/D/I Right elbow abrasions with dressing C/D/I.  Abdominal wound with VAC Psychiatric: He has a normal mood and affect. His behavior is normal. Thought  content normal.    Assessment/Plan: 1. Functional deficits secondary to Polytrauma which require 3+ hours per day of interdisciplinary therapy in a comprehensive inpatient rehab setting. Physiatrist is providing close team supervision and 24 hour management of active medical problems listed below. Physiatrist and rehab team continue to assess barriers to discharge/monitor patient progress toward functional and medical goals.  Function:  Bathing Bathing position   Position: Bed  Bathing parts Body parts bathed by patient: Right arm, Left arm, Chest, Abdomen, Front perineal area, Right upper leg, Left upper leg Body parts bathed by helper: Left lower leg, Back, Right lower leg, Buttocks  Bathing assist        Upper Body Dressing/Undressing Upper body dressing   What is the patient wearing?: Pull over shirt/dress, Orthosis     Pull over shirt/dress - Perfomed by patient: Thread/unthread left sleeve, Thread/unthread right sleeve, Put head through opening Pull over shirt/dress - Perfomed by helper: Pull shirt over trunk     Orthosis activity level: Performed by helper  Upper body assist        Lower Body Dressing/Undressing Lower body dressing   What is the patient wearing?: Pants, Non-skid slipper socks     Pants- Performed by patient: Thread/unthread left pants leg Pants- Performed by helper: Thread/unthread right pants leg, Pull pants up/down(rolling in bed to pull over hips this session) Non-skid slipper socks- Performed by patient: Don/doff right sock(sockaide  used) Non-skid slipper socks- Performed by helper: Don/doff left sock                  Lower body assist        Toileting Toileting Toileting activity did not occur: Safety/medical concerns   Toileting steps completed by helper: Adjust clothing prior to toileting, Performs perineal hygiene, Adjust clothing after toileting    Toileting assist     Transfers Chair/bed transfer   Chair/bed transfer  method: Lateral scoot Chair/bed transfer assist level: Maximal assist (Pt 25 - 49%/lift and lower) Chair/bed transfer assistive device: Armrests, Sliding board     Locomotion Ambulation Ambulation activity did not occur: Safety/medical concerns(WB precautions)         Wheelchair   Type: Manual Max wheelchair distance: 75 Assist Level: Supervision or verbal cues  Cognition Comprehension Comprehension assist level: Understands complex 90% of the time/cues 10% of the time  Expression Expression assist level: Expresses complex 90% of the time/cues < 10% of the time  Social Interaction Social Interaction assist level: Interacts appropriately with others with medication or extra time (anti-anxiety, antidepressant).  Problem Solving Problem solving assist level: Solves basic problems with no assist  Memory Memory assist level: More than reasonable amount of time    Medical Problem List and Plan: 1. Deficits with mobility, transfers, self-care secondary to polytrauma.   Continue CIR, very limited by weight bearing prec 2. DVT Prophylaxis/Anticoagulation: Pharmaceutical: Other (comment)--Eliquis 3. Pain Management: tylenol and/or oxycodone prn effective.  4. Mood: LCSW to follow for evaluation and support.  5. Neuropsych: This patient is capable of making decisions on his own behalf. 6. Skin/Wound Care: Wound VAC to midline incision--change MWF. Remove right elbow sutures as have been grown over. Daily drain care with sterile flushes--to stay in place till follow up at [facility] on 6/4? 7. Fluids/Electrolytes/Nutrition: Monitor I/O.  8. Klebsiella UTI:   Changed over to po antibiotics on 5/22, continue for omnicef thorough 5/26.  9. T2DM: On metformin daily--continue to monitor BS ac/hs and use SSI for tighter control.     CBG (last 3)  Recent Labs    06/30/17 2054 06/30/17 2104 07/01/17 0617  GLUCAP <10* 130* 126*  Controlled, one erroneous reading 10. H/o depression: Managed with  prozac. Has good outlookat present. 11. ABLA:    Hemoglobin 10.7 on 5/23  Continue to monitor 12. AKI: Resolved   Encourage fluid intake.    Creatinine 0.52 on 5/23 13. Hypomagnesemia: added mag ox for supplement.    Magnesium 1.7 on 5/23   Continue to monitor 14. Sleep disturbance  Cont Melatonin 15. Hypertension  Continue lisinopril 5 mg daily   Vitals:   06/30/17 2008 07/01/17 0447  BP: 138/80 139/70  Pulse: 90 76  Resp: 15 16  Temp: 99.2 F (37.3 C) 98.2 F (36.8 C)  SpO2: 97% 97%  Controlled 5/26 16. Hypoalbuminemia  Supplement initiated on 5/23 17. Constipation  2 BMs 18.  Urinary retention with UTI- had ant sacral hematoma, ? Sacral plexus injury Omnicef completed, add Flomax, Urecholine LOS (Days) 4 A FACE TO FACE EVALUATION WAS PERFORMED  Charlett Blake 07/01/2017 7:03 AM

## 2017-07-02 ENCOUNTER — Inpatient Hospital Stay (HOSPITAL_COMMUNITY): Payer: BLUE CROSS/BLUE SHIELD | Admitting: Occupational Therapy

## 2017-07-02 ENCOUNTER — Inpatient Hospital Stay (HOSPITAL_COMMUNITY): Payer: BLUE CROSS/BLUE SHIELD

## 2017-07-02 ENCOUNTER — Inpatient Hospital Stay (HOSPITAL_COMMUNITY): Payer: BLUE CROSS/BLUE SHIELD | Admitting: Physical Therapy

## 2017-07-02 DIAGNOSIS — R339 Retention of urine, unspecified: Secondary | ICD-10-CM

## 2017-07-02 LAB — BASIC METABOLIC PANEL
Anion gap: 6 (ref 5–15)
BUN: 19 mg/dL (ref 6–20)
CO2: 26 mmol/L (ref 22–32)
CREATININE: 0.46 mg/dL — AB (ref 0.61–1.24)
Calcium: 8.6 mg/dL — ABNORMAL LOW (ref 8.9–10.3)
Chloride: 105 mmol/L (ref 101–111)
GFR calc Af Amer: >60 mL/min (ref >60)
GLUCOSE: 128 mg/dL — AB (ref 65–99)
Potassium: 4 mmol/L (ref 3.5–5.1)
Sodium: 137 mmol/L (ref 135–145)

## 2017-07-02 LAB — GLUCOSE, CAPILLARY
GLUCOSE-CAPILLARY: 141 mg/dL — AB (ref 65–99)
GLUCOSE-CAPILLARY: 126 mg/dL — AB (ref 65–99)
Glucose-Capillary: 115 mg/dL — ABNORMAL HIGH (ref 65–99)
Glucose-Capillary: 132 mg/dL — ABNORMAL HIGH (ref 65–99)

## 2017-07-02 LAB — CBC
HEMATOCRIT: 32.4 % — AB (ref 39.0–52.0)
Hemoglobin: 10.1 g/dL — ABNORMAL LOW (ref 13.0–17.0)
MCH: 29.6 pg (ref 26.0–34.0)
MCHC: 31.2 g/dL (ref 30.0–36.0)
MCV: 95 fL (ref 78.0–100.0)
PLATELETS: 189 10*3/uL (ref 150–400)
RBC: 3.41 MIL/uL — ABNORMAL LOW (ref 4.22–5.81)
RDW: 17.2 % — AB (ref 11.5–15.5)
WBC: 5.1 10*3/uL (ref 4.0–10.5)

## 2017-07-02 NOTE — Progress Notes (Signed)
Physical Therapy Session Note  Patient Details  Name: Melvin [name] MRN: [id number] Date of Birth: Jul 21, 1950  Today's Date: 07/02/2017 PT Individual Time: 0818-0859 PT Individual Time Calculation (min): 41 min    Skilled Therapeutic Interventions/Progress Updates:    Pt reports 7/10 pain due to not having meds since 3 AM.  Nursing notified.  Pt agreeable to session.  Instructed in diaphragmatic breath to assist in pain control.  Pt rolled b/l with min A to don LSO in supine.  Pt able to verbalize all precautions, however, requires some verbal cues during transfers to maintain precautions.  Pt performed heel slides with L LE for 1 x 10 in bed and transferred supine to sit with min A and support of R LE (ex fix in place).  Pt then sat EOB x 5 min and desired to transfer to w/c.  Due to pt fear of falling, nursing called and pt transferred via slide board with min A x 2.  Pt impulsive one starting transfer but able to complete safely.  Pt then performed 3 x 10 bicep curl and 3 x 10 tricep extension with 6# weight with R UE only  while seated in w/c and nursing issuing meds.  Vitals pre: 147/69;  HR: 82 bpm.  Therapy Documentation Precautions:  Precautions Precautions: Fall, Other (comment) Precaution Comments: NWB LUE; NWB RLE; TDWB LLE for transfers only Restrictions Weight Bearing Restrictions: Yes LUE Weight Bearing: Non weight bearing RLE Weight Bearing: Non weight bearing LLE Weight Bearing: Touchdown weight bearing General: PT Amount of Missed Time (min): 19 Minutes PT Missed Treatment Reason: Other (Comment)  See Function Navigator for Current Functional Status.   Therapy/Group: Individual Therapy  Lacrecia Delval Hilario Quarry 07/02/2017, 9:34 AM

## 2017-07-02 NOTE — Progress Notes (Signed)
Physical Therapy Session Note  Patient Details  Name: Melvin [name] MRN: [id number] Date of Birth: October 17, 1950  Today's Date: 07/02/2017 PT Individual Time: 1100-1210 PT Individual Time Calculation (min): 70 min   Short Term Goals: Week 1:  PT Short Term Goal 1 (Week 1): Pt will perform bed mobility minA supine <>sit  PT Short Term Goal 2 (Week 1): Pt will perform slideboard transfer consistent modA PT Short Term Goal 3 (Week 1): Pt will demo OOB tolerance 4 hours/day outside of therapy sessions PT Short Term Goal 4 (Week 1): Pt will propel w/c x150' with S  Skilled Therapeutic Interventions/Progress Updates: Pt received seated in w/c; c/o pain and discomfort d/t prolonged sitting in w/c >2 hrs, agreeable to treatment. W/c propulsion RUE with modA to assist with steering. Pt voicing concerns about pain in L hamstring, agreeable to soft tissue massage. Returned to bed with minA lateral scoot transfer with transfer board, minA sit >supine for RLE management. Performed soft tissue massage to L medial hamstrings with palpable trigger points throughout. L hamstring ROM with PNF contract/relax 2 rounds of 5 reps 10sec contract/1 min relax. 3x10 reps straight leg raise LLE. Discussed equipment options and various transfer methods for d/c home including slideboard transfer and hoyer lift. Will continue to educate and begin hands on training to increase pt/spouse comfort and confidence with care at home. Remained supine in bed at end of session, all needs in reach. Alerted CSW to pt's spouse request for private duty agencies to potentially hire caregiver.      Therapy Documentation Precautions:  Precautions Precautions: Fall, Other (comment) Precaution Comments: NWB LUE; NWB RLE; TDWB LLE for transfers only Restrictions Weight Bearing Restrictions: Yes LUE Weight Bearing: Non weight bearing RLE Weight Bearing: Non weight bearing LLE Weight Bearing: Touchdown weight bearing   See Function  Navigator for Current Functional Status.   Therapy/Group: Individual Therapy  Luberta Mutter 07/02/2017, 12:16 PM

## 2017-07-02 NOTE — Progress Notes (Signed)
wound at admission on 06/27/17   Dressing change today--wound decreased in depth with healthy granulation tissues. No odor and some bloody oozing--1/2 cm depth linear tunneling at 7 o'clock (at inferior portion). Ulcerated area under dressing with minimal serous drainage. Will order Aquacell Ag to help absorb drainage and for antimicrobial properties.

## 2017-07-02 NOTE — Progress Notes (Signed)
Physical Therapy Note  Patient Details  Name: [name] MRN: [id number] Date of Birth: 04-23-1950 Today's Date: 07/02/2017  0950-1020, 30 min individual tx Pain: 7/10 buttocks, premedicated; instructed in glut sets to increase circulation  Pt stated he had been sitting up around 45 min, including on BSC for bowel movement.  PT tightened LSO which pt had loosened because he was warm. Fan adjusted for pt.  Seated in w/c, Therapeutic exercises performed with bil LEs to increase strength for functional mobility: self stretching L heel cord, x 30 seconds x 2 (active DF), and stretching by PT L hamstrings x 30 seconds x 3, bil glut sets 2 x 10, 1 x 10 long arc quad knee extension.   Repositioned pt in w/c with towel roll near R hip for aligning pelvis; pt stated this was more comfortable.  Pt left resting in w/c with Erin, NT in attendance.   [name] 07/02/2017, 8:26 AM

## 2017-07-02 NOTE — Progress Notes (Signed)
Occupational Therapy Session Note  Patient Details  Name: Melvin [name] MRN: [id number] Date of Birth: 1950/04/05  Today's Date: 07/02/2017 OT Individual Time: 0350-0938 OT Individual Time Calculation (min): 70 min    Short Term Goals: Week 1:  OT Short Term Goal 1 (Week 1): STGS equal to LTGs based on ELOS.  See Care Plan for specific details  Skilled Therapeutic Interventions/Progress Updates:    Treatment session with focus on ADL retraining with bathing and dressing at bed level and toilet transfers to drop arm BSC.  Pt received supine in bed reporting pain and fatigue from being OOB ~2 hours this AM.  Pt willing to engage in bathing and dressing at bed level with wife present observing and asking questions.  Pt completed UB bathing and dressing with setup assist and assist to wash back and pull shirt over back in sidelying.  Completed LB bathing with pt able to wash LLE and perineal area, requiring assistance to wash buttocks for thoroughness.  Therapist thread Rt pang leg, pt donned Lt pant leg with reacher and was able to pull shorts over hips rolling Rt and Lt.  Engaged in discussion with pt and wife regarding home setup and recommendation for hospital bed to allow for pt increased independence with self-care tasks.  Discussed toileting with recommendation for slide board transfers to drop arm BSC next to bed to allow for improved positioning and ability to lean to allow for clothing management and hygiene.  Pt's wife asking questions regarding use of hoyer vs slide board and she reports she doubts she will be comfortable assisting with slide board.  LSO donned in supine, pt completed bed mobility with assist to advance RLE to EOB.  Therapist placed slide board and assisted pt with transfer to drop arm BSC with mod assist while pt's wife observed.  Discussed lateral leans for clothing management and hygiene.  Returned to bed as above and left supine in bed with all needs in reach.  Therapy  Documentation Precautions:  Precautions Precautions: Fall, Other (comment) Precaution Comments: NWB LUE; NWB RLE; TDWB LLE for transfers only Restrictions Weight Bearing Restrictions: Yes LUE Weight Bearing: Non weight bearing RLE Weight Bearing: Non weight bearing LLE Weight Bearing: Touchdown weight bearing General:   Vital Signs: Therapy Vitals Temp: 99.3 F (37.4 C) Temp Source: Oral Pulse Rate: 84 Resp: 18 BP: 127/66 Patient Position (if appropriate): Lying Oxygen Therapy SpO2: 97 % O2 Device: Room Air Pain:  Pt with c/o pain in buttocks with transfer, repositioned  See Function Navigator for Current Functional Status.   Therapy/Group: Individual Therapy  Simonne Come 07/02/2017, 3:48 PM

## 2017-07-02 NOTE — Progress Notes (Signed)
PHYSICAL MEDICINE & REHABILITATION     PROGRESS NOTE  Subjective/Complaints:  Patient seen lying in bed this morning. He states he slept well overnight. He states that overall good weekend. He has questions about urinary retention.  ROS: + Urinary retention. denies CP, SOB, nausea, vomiting, diarrhea.  Objective: Vital Signs: Blood pressure 122/65, pulse 71, temperature 99 F (37.2 C), temperature source Oral, resp. rate 18, height 6' (1.829 m), weight 88.5 kg (195 lb), SpO2 98 %. No results found. Recent Labs    07/02/17 0501  WBC 5.1  HGB 10.1*  HCT 32.4*  PLT 189   Recent Labs    07/02/17 0501  NA 137  K 4.0  CL 105  GLUCOSE 128*  BUN 19  CREATININE 0.46*  CALCIUM 8.6*   CBG (last 3)  Recent Labs    07/01/17 1621 07/01/17 2101 07/02/17 0644  GLUCAP 119* 130* 115*    Wt Readings from Last 3 Encounters:  06/27/17 88.5 kg (195 lb)  06/26/17 92.1 kg (203 lb)  02/19/17 95.2 kg (209 lb 12.8 oz)    Physical Exam:  BP 122/65 (BP Location: Right Arm)   Pulse 71   Temp 99 F (37.2 C) (Oral)   Resp 18   Ht 6' (1.829 m)   Wt 88.5 kg (195 lb)   SpO2 98%   BMI 26.45 kg/m  Constitutional: He appears well-developed and well-nourished. NAD. HENT: Normocephalic and atraumatic.  Eyes: EOM are normal. No discharge.  Cardiovascular: RRR. No JVD. Respiratory: Effort normal and breath sounds normal.  GI: Bowel sounds are normal. Nondistended. Musculoskeletal: Ring fixator in place on right ankle. LUE with long arm splint.  Neurological: He is alert and oriented.  Motor: Right upper extremity: 4/5 proximal distal Left upper extremity: Shoulder abduction 4/5 elbow and wrist braced, hand grip 3/5 Left lower extremity: Hip flexion, knee extension, ankle dorsiflexion 4 -/5 Right lower extremity: Hip flexion, knee extension 2/5, wiggles toes (unchanged) Skin:  Incision right thigh and right flank C/D/I Right elbow abrasions with dressing C/D/I.  Abdominal  wound with VAC Psychiatric: He has a normal mood and affect. His behavior is normal. Thought content normal.    Assessment/Plan: 1. Functional deficits secondary to Polytrauma which require 3+ hours per day of interdisciplinary therapy in a comprehensive inpatient rehab setting. Physiatrist is providing close team supervision and 24 hour management of active medical problems listed below. Physiatrist and rehab team continue to assess barriers to discharge/monitor patient progress toward functional and medical goals.  Function:  Bathing Bathing position   Position: Bed  Bathing parts Body parts bathed by patient: Right arm, Left arm, Chest, Abdomen, Front perineal area, Right upper leg, Left upper leg Body parts bathed by helper: Left lower leg, Back, Right lower leg, Buttocks  Bathing assist Assist Level: Supervision or verbal cues      Upper Body Dressing/Undressing Upper body dressing   What is the patient wearing?: Pull over shirt/dress, Orthosis     Pull over shirt/dress - Perfomed by patient: Thread/unthread left sleeve, Thread/unthread right sleeve, Put head through opening Pull over shirt/dress - Perfomed by helper: Pull shirt over trunk     Orthosis activity level: Performed by helper  Upper body assist Assist Level: Touching or steadying assistance(Pt > 75%)      Lower Body Dressing/Undressing Lower body dressing   What is the patient wearing?: Pants, Non-skid slipper socks     Pants- Performed by patient: Thread/unthread left pants leg Pants- Performed by helper:  Thread/unthread right pants leg, Pull pants up/down Non-skid slipper socks- Performed by patient: Don/doff right sock Non-skid slipper socks- Performed by helper: Don/doff left sock                  Lower body assist Assist for lower body dressing: Touching or steadying assistance (Pt > 75%)      Toileting Toileting Toileting activity did not occur: Safety/medical concerns   Toileting steps  completed by helper: Adjust clothing prior to toileting, Performs perineal hygiene, Adjust clothing after toileting    Toileting assist Assist level: Two helpers   Transfers Chair/bed transfer   Chair/bed transfer method: Lateral scoot Chair/bed transfer assist level: Maximal assist (Pt 25 - 49%/lift and lower) Chair/bed transfer assistive device: Armrests, Sliding board     Locomotion Ambulation Ambulation activity did not occur: Safety/medical concerns(WB precautions)         Wheelchair   Type: Manual Max wheelchair distance: 75 Assist Level: Supervision or verbal cues  Cognition Comprehension Comprehension assist level: Follows basic conversation/direction with no assist  Expression Expression assist level: Expresses basic needs/ideas: With no assist  Social Interaction Social Interaction assist level: Interacts appropriately 90% of the time - Needs monitoring or encouragement for participation or interaction.  Problem Solving Problem solving assist level: Solves basic problems with no assist  Memory Memory assist level: More than reasonable amount of time    Medical Problem List and Plan: 1. Deficits with mobility, transfers, self-care secondary to polytrauma.   Continue CIR 2. DVT Prophylaxis/Anticoagulation: Pharmaceutical: Other (comment)--Eliquis 3. Pain Management: tylenol and/or oxycodone prn effective.  4. Mood: LCSW to follow for evaluation and support.  5. Neuropsych: This patient is capable of making decisions on his own behalf. 6. Skin/Wound Care: Wound VAC to midline incision--change MWF. Remove right elbow sutures as have been grown over. Daily drain care with sterile flushes--to stay in place till follow up at [facility] 7. Fluids/Electrolytes/Nutrition: Monitor I/O.  8. Klebsiella UTI:   Changed over to po antibiotics on 5/22, omnicef completed on 5/26.  9. T2DM: On metformin daily--continue to monitor BS ac/hs and use SSI for tighter control.     CBG (last  3)  Recent Labs    07/01/17 1621 07/01/17 2101 07/02/17 0644  GLUCAP 119* 130* 115*   Relatively controlled on 5/27 10. H/o depression: Managed with prozac. Has good outlookat present. 11. ABLA:    Hemoglobin 10.1 on 5/27  Continue to monitor 12. AKI: Resolved   Encourage fluid intake.  13. Hypomagnesemia: added mag ox for supplement.    Magnesium 1.7 on 5/23   Continue to monitor 14. Sleep disturbance  Cont Melatonin 15. Hypertension  Continue lisinopril 5 mg daily   Vitals:   07/01/17 2000 07/02/17 0317  BP: (!) 151/82 122/65  Pulse: 82 71  Resp: 19 18  Temp: 99 F (37.2 C) 99 F (37.2 C)  SpO2: 97% 98%   One elevation, otherwise controlled on 5/27 16. Hypoalbuminemia  Supplement initiated on 5/23 17. Constipation  Continue bowel meds 18.  Urinary retention with UTI- had ant sacral hematoma,   ?Sacral plexus injury, Omnicef completed  Added Flomax  Added Urecholine  LOS (Days) 5 A FACE TO FACE EVALUATION WAS PERFORMED  Melvin [name] Lorie Phenix 07/02/2017 7:29 AM

## 2017-07-02 NOTE — Discharge Instructions (Addendum)
Inpatient Rehab Discharge Instructions  CORDALE MANERA Discharge date and time:  07/10/17  Activities/Precautions/ Functional Status: Activity: NO weight on right leg and left arm. Touchdown weigh Left leg for transfers.  Diet: regular diet Wound Care: 1.  Flush drain with 15 cc sterile water daily, charge drain and document daily output.                         2. Cleanse around abdominal wound with sterile saline. Pat dry. Cover with damp gauze--pack lower tract. Then cover with dry dressing and tape.  --wash incision left thigh with soap and water. Keep clean and dry.  Contact MD if you develop any problems with your incision/wound--redness, swelling, increase in pain, drainage or if you develop fever or chills.   Functional status:  ___ No restrictions     ___ Walk up steps independently _X__ 24/7 supervision/assistance   ___ Walk up steps with assistance ___ Intermittent supervision/assistance  ___ Bathe/dress independently ___ Walk with walker     _X__ Bathe/dress with assistance ___ Walk Independently    ___ Shower independently ___ Walk with assistance    ___ Shower with assistance _X__ No alcohol     ___ Return to work/school ________   Special Instructions: 1. Appointment with orthopedic doctors at 9:30 am and 10:30 am 2. Cath 4-5 times a day to keep volumes less than 350 cc. Stop drinking fluids after 7 pm---use ice chips.    COMMUNITY REFERRALS UPON DISCHARGE:    Home Health:   PT, OT, RN    Agency:[facility]    Date of last service:07/10/2017   Medical Equipment/Items Ordered:HOSPITAL BED WITH AIR OVER LAY, WIDE DROP ARM BEDSIDE COMMODE, WHEELCHAIR, HOYER LIFT AND 30 TRANSFER BOARD  Agency/Supplier:[facility] [phone]    My questions have been answered and I understand these instructions. I will adhere to these goals and the provided educational materials after my discharge from the hospital.  Patient/Caregiver Signature  _______________________________ Date __________  Clinician Signature _______________________________________ Date __________  Please bring this form and your medication list with you to all your follow-up doctor's appointments. Information on my medicine - ELIQUIS (apixaban)  This medication education was reviewed with me or my healthcare representative as part of my discharge preparation.  The pharmacist that spoke with me during my hospital stay was:  Marney Setting, [facility]  Why was Eliquis prescribed for you? Eliquis was prescribed for you to reduce the risk of forming blood clots that can cause a stroke if you have a medical condition called atrial fibrillation (a type of irregular heartbeat) OR to reduce the risk of a blood clots forming after orthopedic surgery.  What do You need to know about Eliquis ? Take your Eliquis TWICE DAILY - one tablet in the morning and one tablet in the evening with or without food.  It would be best to take the doses about the same time each day.  If you have difficulty swallowing the tablet whole please discuss with your pharmacist how to take the medication safely.  Take Eliquis exactly as prescribed by your doctor and DO NOT stop taking Eliquis without talking to the doctor who prescribed the medication.  Stopping may increase your risk of developing a new clot or stroke.  Refill your prescription before you run out.  After discharge, you should have regular check-up appointments with your healthcare provider that is prescribing your Eliquis.  In the future your  dose may need to be changed if your kidney function or weight changes by a significant amount or as you get older.  What do you do if you miss a dose? If you miss a dose, take it as soon as you remember on the same day and resume taking twice daily.  Do not take more than one dose of ELIQUIS at the same time.  Important Safety Information A possible side effect of Eliquis is bleeding. You  should call your healthcare provider right away if you experience any of the following: ? Bleeding from an injury or your nose that does not stop. ? Unusual colored urine (red or dark Zea) or unusual colored stools (red or black). ? Unusual bruising for unknown reasons. ? A serious fall or if you hit your head (even if there is no bleeding).  Some medicines may interact with Eliquis and might increase your risk of bleeding or clotting while on Eliquis. To help avoid this, consult your healthcare provider or pharmacist prior to using any new prescription or non-prescription medications, including herbals, vitamins, non-steroidal anti-inflammatory drugs (NSAIDs) and supplements.  This website has more information on Eliquis (apixaban): [email or web address].no.

## 2017-07-02 NOTE — Progress Notes (Addendum)
Patient has home unit at bedside. Patient placed himself on CPAP while RT at bedside.

## 2017-07-02 NOTE — Progress Notes (Signed)
Social Work Patient ID: Melvin [name], male   DOB: 1951/01/06, 67 y.o.   MRN: [id number]  Met with wife to give her a private duty list so she can begin looking into hiring a caregiver for pt. They do have a few people who can help but will need more assist. The ramp is already being started to get into their home. Continue to work on discharge needs.

## 2017-07-03 ENCOUNTER — Inpatient Hospital Stay (HOSPITAL_COMMUNITY): Payer: BLUE CROSS/BLUE SHIELD | Admitting: Physical Therapy

## 2017-07-03 ENCOUNTER — Inpatient Hospital Stay (HOSPITAL_COMMUNITY): Payer: BLUE CROSS/BLUE SHIELD | Admitting: Occupational Therapy

## 2017-07-03 LAB — GLUCOSE, CAPILLARY
GLUCOSE-CAPILLARY: 129 mg/dL — AB (ref 65–99)
GLUCOSE-CAPILLARY: 126 mg/dL — AB (ref 65–99)
Glucose-Capillary: 155 mg/dL — ABNORMAL HIGH (ref 65–99)
Glucose-Capillary: 139 mg/dL — ABNORMAL HIGH (ref 65–99)

## 2017-07-03 NOTE — Progress Notes (Signed)
Pt has home cpap and places self on/off home unit. RT will monitor.

## 2017-07-03 NOTE — Progress Notes (Signed)
[facility] PHYSICAL MEDICINE & REHABILITATION     PROGRESS [name]  Subjective/Complaints:  Pt seen sitting up in bed this AM.  Melvin [name] slept well overnight for the most part.  Melvin [name] states Melvin [name] urinated a little bit and has questions about urination.    ROS: +Urinary retention. Denies CP, SOB, nausea, vomiting, diarrhea.  Objective: Vital Signs: Blood pressure 136/77, pulse 75, temperature 98.7 F (37.1 C), temperature source Oral, resp. rate 18, height 6' (1.829 m), weight 88.5 kg (195 lb), SpO2 95 %. No results found. Recent Labs    07/02/17 0501  WBC 5.1  HGB 10.1*  HCT 32.4*  PLT 189   Recent Labs    07/02/17 0501  NA 137  K 4.0  CL 105  GLUCOSE 128*  BUN 19  CREATININE 0.46*  CALCIUM 8.6*   CBG (last 3)  Recent Labs    07/02/17 1651 07/02/17 2123 07/03/17 0624  GLUCAP 126* 132* 129*    Wt Readings from Last 3 Encounters:  06/27/17 88.5 kg (195 lb)  06/26/17 92.1 kg (203 lb)  02/19/17 95.2 kg (209 lb 12.8 oz)    Physical Exam:  BP 136/77 (BP Location: Right Arm)   Pulse 75   Temp 98.7 F (37.1 C) (Oral)   Resp 18   Ht 6' (1.829 m)   Wt 88.5 kg (195 lb)   SpO2 95%   BMI 26.45 kg/m  Constitutional: Melvin [name] appears well-developed and well-nourished. NAD. HENT: Normocephalic and atraumatic.  Eyes: EOM are normal. No discharge.  Cardiovascular: RRR. No JVD. Respiratory: Effort normal and breath sounds normal.  GI: Bowel sounds are normal. Nondistended. Musculoskeletal: Ring fixator in place on right ankle. LUE with long arm splint.  Neurological: Melvin [name] is alert and oriented.  Motor: Right upper extremity: 4/5 proximal distal Left upper extremity: Shoulder abduction 4/5 elbow and wrist braced, hand grip 3/5 Left lower extremity: Hip flexion, knee extension, ankle dorsiflexion 4--4/5 Right lower extremity: Hip flexion, knee extension 3/5, wiggles toes  Skin:  Incision right thigh and right flank C/D/I Right elbow abrasions with dressing C/D/I.  Abdominal wound  with VAC Psychiatric: Melvin [name] has a normal mood and affect. Melvin [name] behavior is normal. Thought content normal.    Assessment/Plan: 1. Functional deficits secondary to Polytrauma which require 3+ hours per day of interdisciplinary therapy in a comprehensive inpatient rehab setting. Physiatrist is providing close team supervision and 24 hour management of active medical problems listed below. Physiatrist and rehab team continue to assess barriers to discharge/monitor Melvin [name] progress toward functional and medical goals.  Function:  Bathing Bathing position   Position: Bed  Bathing parts Body parts bathed by Melvin [name]: Right arm, Left arm, Chest, Abdomen, Front perineal area, Right upper leg, Left upper leg Body parts bathed by helper: Buttocks, Left lower leg, Back  Bathing assist Assist Level: Touching or steadying assistance(Pt > 75%)      Upper Body Dressing/Undressing Upper body dressing   What is the Melvin [name] wearing?: Pull over shirt/dress, Orthosis     Pull over shirt/dress - Perfomed by Melvin [name]: Thread/unthread left sleeve, Thread/unthread right sleeve, Put head through opening Pull over shirt/dress - Perfomed by helper: Pull shirt over trunk     Orthosis activity level: Performed by helper  Upper body assist Assist Level: Touching or steadying assistance(Pt > 75%)      Lower Body Dressing/Undressing Lower body dressing   What is the Melvin [name] wearing?: Pants     Pants- Performed by Melvin [name]: Thread/unthread left pants leg, Pull pants up/down  Pants- Performed by helper: Thread/unthread right pants leg Non-skid slipper socks- Performed by Melvin [name]: Don/doff right sock Non-skid slipper socks- Performed by helper: Don/doff left sock                  Lower body assist Assist for lower body dressing: Touching or steadying assistance (Pt > 75%)(Mod assist)      Toileting Toileting Toileting activity did not occur: Safety/medical concerns   Toileting steps completed by helper:  Adjust clothing prior to toileting, Performs perineal hygiene, Adjust clothing after toileting    Toileting assist Assist level: Two helpers   Transfers Chair/bed transfer   Chair/bed transfer method: Lateral scoot Chair/bed transfer assist level: Touching or steadying assistance (Pt > 75%) Chair/bed transfer assistive device: Armrests, Sliding board     Locomotion Ambulation Ambulation activity did not occur: Safety/medical concerns(WB precautions)         Wheelchair   Type: Manual Max wheelchair distance: 150 Assist Level: Moderate assistance (Pt 50 - 74%)  Cognition Comprehension Comprehension assist level: Follows basic conversation/direction with no assist  Expression Expression assist level: Expresses basic needs/ideas: With no assist  Social Interaction Social Interaction assist level: Interacts appropriately 90% of the time - Needs monitoring or encouragement for participation or interaction.  Problem Solving Problem solving assist level: Solves complex 90% of the time/cues < 10% of the time  Memory Memory assist level: More than reasonable amount of time    Medical Problem List and Plan: 1. Deficits with mobility, transfers, self-care secondary to polytrauma.   Continue CIR 2. DVT Prophylaxis/Anticoagulation: Pharmaceutical: Other (comment)--Eliquis 3. Pain Management: tylenol and/or oxycodone prn effective.  4. Mood: LCSW to follow for evaluation and support.  5. Neuropsych: This Melvin [name] is capable of making decisions on Melvin [name] own behalf. 6. Skin/Wound Care: Wound VAC to midline incision--change MWF. Daily drain care with sterile flushes--to stay in place till follow up at [facility]  See notes for wound  7. Fluids/Electrolytes/Nutrition: Monitor I/O.  8. Klebsiella UTI:   Changed over to po antibiotics on 5/22, omnicef completed on 5/26.  9. T2DM: On metformin daily--continue to monitor BS ac/hs and use SSI for tighter control.     CBG (last 3)  Recent Labs     07/02/17 1651 07/02/17 2123 07/03/17 0624  GLUCAP 126* 132* 129*   Relatively controlled on 5/28 10. H/o depression: Managed with prozac. Has good outlookat present. 11. ABLA:    Hemoglobin 10.1 on 5/27  Continue to monitor 12. AKI: Resolved   Encourage fluid intake.  13. Hypomagnesemia: added mag ox for supplement.    Magnesium 1.7 on 5/23   Continue to monitor 14. Sleep disturbance  Cont Melatonin 15. Hypertension  Continue lisinopril 5 mg daily   Vitals:   07/02/17 2116 07/03/17 0434  BP: (!) 152/76 136/77  Pulse: 85 75  Resp: 18 18  Temp: 98.6 F (37 C) 98.7 F (37.1 C)  SpO2: 95% 95%   One elevation, otherwise controlled on 5/28 16. Hypoalbuminemia  Supplement initiated on 5/23 17. Constipation  Continue bowel meds 18.  Urinary retention with UTI- had ant sacral hematoma  ?Sacral plexus injury, Omnicef completed  Added Flomax  Added Urecholine  ?Improving  LOS (Days) 6 A FACE TO FACE EVALUATION WAS PERFORMED  Reisha Wos Lorie Phenix 07/03/2017 8:20 AM

## 2017-07-03 NOTE — Progress Notes (Signed)
Occupational Therapy Session Note  Patient Details  Name: Melvin [name] MRN: [id number] Date of Birth: August 31, 1950  Today's Date: 07/03/2017 OT Individual Time: 1330-1400 OT Individual Time Calculation (min): 30 min    Short Term Goals: Week 1:  OT Short Term Goal 1 (Week 1): STGS equal to LTGs based on ELOS.  See Care Plan for specific details  Skilled Therapeutic Interventions/Progress Updates:    1:1 Focus on slide board transfers while maintaining his precautions with mod A . From mat to w/c<drop arm commode to bed. All with mod cues for head hip relationship and proper hand placement. During transfer back to bed pt became incontinent of bowel. Focus on bed mobility (rolling) for changing clothes and brief. Left resting in bed.   Therapy Documentation Precautions:  Precautions Precautions: Fall, Other (comment) Precaution Comments: NWB LUE; NWB RLE; TDWB LLE for transfers only Restrictions Weight Bearing Restrictions: Yes LUE Weight Bearing: Non weight bearing RLE Weight Bearing: Non weight bearing LLE Weight Bearing: Touchdown weight bearing    Vital Signs: Therapy Vitals Temp: 98 F (36.7 C) Temp Source: Oral Pulse Rate: 93 Resp: 16 BP: 133/72 Patient Position (if appropriate): Lying Oxygen Therapy SpO2: 93 % O2 Device: Room Air Pain: Pain Assessment Pain Score: 2   See Function Navigator for Current Functional Status.   Therapy/Group: Individual Therapy  Willeen Cass [facility] 07/03/2017, 4:04 PM

## 2017-07-03 NOTE — Progress Notes (Signed)
Occupational Therapy Session Note  Patient Details  Name: Melvin [name] MRN: [id number] Date of Birth: 11/24/1950  Today's Date: 07/03/2017 OT Individual Time: 1004-1100 OT Individual Time Calculation (min): 56 min    Short Term Goals: Week 1:  OT Short Term Goal 1 (Week 1): STGS equal to LTGs based on ELOS.  See Care Plan for specific details  Skilled Therapeutic Interventions/Progress Updates:    Treatment session with focus on ADL retraining with bathing/dressing and toilet transfers with toileting.  Pt received upright in w/c with friend present who plans to assist with self-care tasks and transfers upon d/c.  Educated on placement of slide board for transfers bed <> w/c and bed <> drop arm BSC.  Therapist placed slide board and pt completed transfer to bed with min assist.  Discussed toileting technique with lateral leans for clothing management and hygiene.  Pt completed slide board transfer to drop arm BSC from bed with min assist after therapist positioned board.  Pt reports need to toilet.  Completed lean to Rt elbow onto bed while therapist adjusted pants.  Pt continent BM, required assist for hygiene and pants back over hips with lateral leans.  Returned to bed via slide board and completed bathing with setup assist and rolling Rt and Lt.  Pt able to pull shorts over hips after therapist assisted with donning RLE due to external fixator.    Pt's friend assisted with clothing management and hygiene post toileting to allow for some hands on training.  She will require additional hands on prior to d/c home.  Therapy Documentation Precautions:  Precautions Precautions: Fall, Other (comment) Precaution Comments: NWB LUE; NWB RLE; TDWB LLE for transfers only Restrictions Weight Bearing Restrictions: Yes LUE Weight Bearing: Non weight bearing RLE Weight Bearing: Non weight bearing LLE Weight Bearing: Touchdown weight bearing Pain: Pain Assessment Pain Score: 3   See Function  Navigator for Current Functional Status.   Therapy/Group: Individual Therapy  Simonne Come 07/03/2017, 12:21 PM

## 2017-07-03 NOTE — Progress Notes (Signed)
Physical Therapy Session Note  Patient Details  Name: Melvin [name] MRN: [id number] Date of Birth: [birth year]  Today's Date: 07/03/2017 PT Individual Time: 0900-1000 and 1300-1330 PT Individual Time Calculation (min): 60 min and 30 min (total 90 min)   Short Term Goals: Week 1:  PT Short Term Goal 1 (Week 1): Pt will perform bed mobility minA supine <>sit  PT Short Term Goal 2 (Week 1): Pt will perform slideboard transfer consistent modA PT Short Term Goal 3 (Week 1): Pt will demo OOB tolerance 4 hours/day outside of therapy sessions PT Short Term Goal 4 (Week 1): Pt will propel w/c x150' with S  Skilled Therapeutic Interventions/Progress Updates: Tx 1: Pt received seated in bed, denies pain at rest and agreeable to treatment. Pt reports wife unable to come in for therapy session d/t not feeling well, but friend who may help at d/c plans to come in for OT session. Pt dons shorts maxA for threading over RLE, and pulling pants over tip of hips with S rolling R/L, able to thread LLE and pull pants halfway over hips without assist. Therapist obtained standard w/c to assess fit/tolerance compared to reclining back w/c. Supine>sit with minA for RLE management. Transfer bed>w/c min guard, setupA for board placement and stabilizing board during transfer. W/c propulsion RUE/LLE throughout unit with S, increased time. Performed car transfer with min guard and transfer board; discussed need to practice into patients vehicle at home. Pt will discuss with wife and plan for trial Thursday. Performed LLE marching and long arc quads with 4# ankle weight 2x15 reps. Remained seated in w/c at end of session, all needs in reach and handoff to OT for next session.   Tx 2: Pt received supine in bed, denies pain and agreeable to treatment. Supine>sit with S. Lateral scoot transfer to w/c setupA, min cues for technique. W/c propulsion with S RUE/LLE x150'. Performed lateral scoot transfer to mat table with pt directing  care for transfer as if therapist had never performed before; required moderate question cueing for more specific details regarding setup and assist needed. Educated pt re: importance of being able to direct care to new caregivers. Performed RLE hip flexion and long arc quad AROM 2x10 reps each. Remained seated on edge of mat table with next therapist alerted to pt position.       Therapy Documentation Precautions:  Precautions Precautions: Fall, Other (comment) Precaution Comments: NWB LUE; NWB RLE; TDWB LLE for transfers only Restrictions Weight Bearing Restrictions: Yes LUE Weight Bearing: Non weight bearing RLE Weight Bearing: Non weight bearing LLE Weight Bearing: Touchdown weight bearing   See Function Navigator for Current Functional Status.   Therapy/Group: Individual Therapy  Luberta Mutter 07/03/2017, 12:12 PM

## 2017-07-04 ENCOUNTER — Inpatient Hospital Stay (HOSPITAL_COMMUNITY): Payer: BLUE CROSS/BLUE SHIELD | Admitting: Physical Therapy

## 2017-07-04 ENCOUNTER — Inpatient Hospital Stay (HOSPITAL_COMMUNITY): Payer: BLUE CROSS/BLUE SHIELD

## 2017-07-04 ENCOUNTER — Inpatient Hospital Stay (HOSPITAL_COMMUNITY): Payer: BLUE CROSS/BLUE SHIELD | Admitting: Occupational Therapy

## 2017-07-04 ENCOUNTER — Inpatient Hospital Stay (HOSPITAL_COMMUNITY): Payer: BLUE CROSS/BLUE SHIELD | Admitting: *Deleted

## 2017-07-04 DIAGNOSIS — Z87828 Personal history of other (healed) physical injury and trauma: Secondary | ICD-10-CM

## 2017-07-04 DIAGNOSIS — T07XXXA Unspecified multiple injuries, initial encounter: Secondary | ICD-10-CM

## 2017-07-04 LAB — GLUCOSE, CAPILLARY
GLUCOSE-CAPILLARY: 140 mg/dL — AB (ref 65–99)
GLUCOSE-CAPILLARY: 98 mg/dL (ref 65–99)
Glucose-Capillary: 139 mg/dL — ABNORMAL HIGH (ref 65–99)
Glucose-Capillary: 106 mg/dL — ABNORMAL HIGH (ref 65–99)

## 2017-07-04 MED ORDER — TAMSULOSIN HCL 0.4 MG PO CAPS
0.8000 mg | ORAL_CAPSULE | Freq: Every day | ORAL | Status: DC
  Administered 2017-07-04 – 2017-07-09 (×6): 0.8 mg via ORAL
  Filled 2017-07-04 (×6): qty 2

## 2017-07-04 MED ORDER — METFORMIN HCL ER 750 MG PO TB24
750.0000 mg | ORAL_TABLET | Freq: Every day | ORAL | Status: DC
  Administered 2017-07-04 – 2017-07-10 (×7): 750 mg via ORAL
  Filled 2017-07-04 (×7): qty 1

## 2017-07-04 NOTE — Progress Notes (Signed)
Patient has home CPAP within reach at bedside. RT assistance not needed at this time.

## 2017-07-04 NOTE — Progress Notes (Signed)
Occupational Therapy Session Note  Patient Details  Name: LINDLEY HINEY MRN: [id number] Date of Birth: [birth year]  Today's Date: 07/04/2017 OT Individual Time: 0900-1010 OT Individual Time Calculation (min): 70 min    Short Term Goals: Week 1:  OT Short Term Goal 1 (Week 1): STGS equal to LTGs based on ELOS.  See Care Plan for specific details  Skilled Therapeutic Interventions/Progress Updates:    OT intervention with focus on bed mobility and BADL retraining at bed level.  Pt did not have any shorts and elected to remain in bed at end of session awaiting clothing.  Focus on bathing and UB dressing at bed level.  Pt able to roll in bed at supervision level using bed rails to assist.  Pt able to doff/don shirt and complete bathing except buttocks without assistance.  Pt able to reposition in bed using bed rails without assistance.  Pt completed grooming tasks at bed level.   Therapy Documentation Precautions:  Precautions Precautions: Fall, Other (comment) Precaution Comments: NWB LUE; NWB RLE; TDWB LLE for transfers only Restrictions Weight Bearing Restrictions: Yes LUE Weight Bearing: Non weight bearing RLE Weight Bearing: Non weight bearing LLE Weight Bearing: Touchdown weight bearing  Pain: Pain Assessment Pain Scale: 0-10 Pain Score: 5  Pain Type: Acute pain;Chronic pain Pain Location: Back Pain Orientation: Lower Pain Descriptors / Indicators: Aching;Nagging Pain Frequency: Constant Pain Onset: On-going Pain Intervention(s): Medication (See eMAR)    See Function Navigator for Current Functional Status.   Therapy/Group: Individual Therapy  Leroy Libman 07/04/2017, 12:18 PM
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
## 2017-07-04 NOTE — Progress Notes (Signed)
Physical Therapy Session Note  Patient Details  Name: Melvin [name] MRN: [id number] Date of Birth: [birth year]  Today's Date: 07/04/2017 PT Individual Time: 1600-1700   60 min   Short Term Goals: Week 1:  PT Short Term Goal 1 (Week 1): Pt will perform bed mobility minA supine <>sit  PT Short Term Goal 2 (Week 1): Pt will perform slideboard transfer consistent modA PT Short Term Goal 3 (Week 1): Pt will demo OOB tolerance 4 hours/day outside of therapy sessions PT Short Term Goal 4 (Week 1): Pt will propel w/c x150' with S  Skilled Therapeutic Interventions/Progress Updates:   Pt received supine in bed and agreeable to PT. Supine>sit transfer with min assist and moderate cues for NWB through the LUE and RLE.   SB transfers to and from [facility] x 4 throughout treatment with set up assist from PT min cues for decreased WC through the LLE  WC mobility instructed in various environments including level surface of hall in rehab unit and over unlevel surface of cement sidewalk at hospital entrance. 183f with supervision assist in controlled environment and 1540fwith min assist from PT on sidewalk.   BLE therex. LAQ 2 x 10. Reciprocal hip flexion within available range x 5 RLE and x 10 LLE. LLE ankle pumps x 20.   [facility] reach and all needs met.        Therapy Documentation Precautions:  Precautions Precautions: Fall, Other (comment) Precaution Comments: NWB LUE; NWB RLE; TDWB LLE for transfers only Restrictions Weight Bearing Restrictions: Yes LUE Weight Bearing: Non weight bearing RLE Weight Bearing: Non weight bearing LLE Weight Bearing: Touchdown weight bearing Vital Signs: Therapy Vitals Temp: 97.8 F (36.6 C) Temp Source: Oral Pulse Rate: 98 Resp: 16 BP: 115/71 Patient Position (if appropriate): Sitting Oxygen Therapy SpO2: 94 % O2 Device: Room Air Pain: Pain Assessment Pain Scale: 0-10 Pain Score: 4  Faces  Pain Scale: Hurts a little bit Pain Type: Acute pain;Chronic pain Pain Location: Back Pain Orientation: Lower Pain Descriptors / Indicators: Aching;Discomfort Pain Frequency: Constant Pain Onset: On-going Patients Stated Pain Goal: 4 Pain Intervention(s): Medication (See eMAR)   See Function Navigator for Current Functional Status.   Therapy/Group: Individual Therapy  AuLorie Phenix/29/2019, 4:53 PM

## 2017-07-04 NOTE — Progress Notes (Signed)
Pt family friend, Melee who is going to be primary care taker at home is at bedside. Was asking nurse about In and Out cath if pt went home with it. Educated clean technique for I&O cath. successful attempt. Encouraged to practice as much as often to feel more confident when DC. Pt also stated he would be willing to learn if needed to be. Call bell within reach. Will cont to monitor.   Pamella Pert, LPN

## 2017-07-04 NOTE — Progress Notes (Signed)
Occupational Therapy Session Note  Patient Details  Name: Melvin [name] MRN: [id number] Date of Birth: 1950-06-27  Today's Date: 07/04/2017 OT Individual Time: 1300-1400 OT Individual Time Calculation (min): 60 min    Short Term Goals: Week 1:  OT Short Term Goal 1 (Week 1): STGS equal to LTGs based on ELOS.  See Care Plan for specific details  Skilled Therapeutic Interventions/Progress Updates:    Pt worked on LB dressing sitting EOB with use of the reacher and sockaide.  He was able to use the reacher for threading pants over his LLE and therapist assisted on the right side secondary to the size of the external fixator on his right foot.  He was able to apply the gripper sock on the sockaide and then donn on his left foot with setup of items as well.  Pt's hired caregiver Melvin [name] was in for education as well.  Had pt work on sliding board transfers from bed to drop arm commode and back as well as from wheelchair to drop arm commode and back.  She was able to complete several transfers with use of the board and min instructional cueing for technique and placement.  Once completed, pt was positioned back in the wheelchair with RLE elevated and supported and foot rest.  Call button and phone in reach with visitor present.    Therapy Documentation Precautions:  Precautions Precautions: Fall, Other (comment) Precaution Comments: NWB LUE; NWB RLE; TDWB LLE for transfers only Restrictions Weight Bearing Restrictions: Yes LUE Weight Bearing: Non weight bearing RLE Weight Bearing: Non weight bearing LLE Weight Bearing: Touchdown weight bearing  Pain: Pain Assessment Pain Scale: 0-10 Pain Score: 6  Faces Pain Scale: Hurts a little bit Pain Type: Acute pain;Chronic pain Pain Location: Back Pain Orientation: Lower Pain Descriptors / Indicators: Aching;Discomfort Pain Frequency: Constant Pain Onset: On-going Patients Stated Pain Goal: 4 Pain Intervention(s): Medication (See eMAR) ADL: See  Function Navigator for Current Functional Status.   Therapy/Group: Individual Therapy  Daron Breeding OTR/L 07/04/2017, 3:59 PM
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
## 2017-07-05 ENCOUNTER — Inpatient Hospital Stay (HOSPITAL_COMMUNITY): Payer: BLUE CROSS/BLUE SHIELD | Admitting: Occupational Therapy

## 2017-07-05 ENCOUNTER — Inpatient Hospital Stay (HOSPITAL_COMMUNITY): Payer: BLUE CROSS/BLUE SHIELD | Admitting: Physical Therapy

## 2017-07-05 DIAGNOSIS — H04123 Dry eye syndrome of bilateral lacrimal glands: Secondary | ICD-10-CM

## 2017-07-05 DIAGNOSIS — R0989 Other specified symptoms and signs involving the circulatory and respiratory systems: Secondary | ICD-10-CM | POA: Insufficient documentation

## 2017-07-05 LAB — GLUCOSE, CAPILLARY
GLUCOSE-CAPILLARY: 103 mg/dL — AB (ref 65–99)
GLUCOSE-CAPILLARY: 164 mg/dL — AB (ref 65–99)
Glucose-Capillary: 130 mg/dL — ABNORMAL HIGH (ref 65–99)
Glucose-Capillary: 138 mg/dL — ABNORMAL HIGH (ref 65–99)

## 2017-07-05 MED ORDER — POLYVINYL ALCOHOL 1.4 % OP SOLN
1.0000 [drp] | Freq: Three times a day (TID) | OPHTHALMIC | Status: DC | PRN
  Filled 2017-07-05: qty 15

## 2017-07-05 MED ORDER — LISINOPRIL 5 MG PO TABS
2.5000 mg | ORAL_TABLET | Freq: Two times a day (BID) | ORAL | Status: DC
  Administered 2017-07-05 – 2017-07-10 (×11): 2.5 mg via ORAL
  Filled 2017-07-05 (×10): qty 1

## 2017-07-05 MED ORDER — KETOTIFEN FUMARATE 0.025 % OP SOLN
1.0000 [drp] | Freq: Two times a day (BID) | OPHTHALMIC | Status: DC
  Administered 2017-07-05 – 2017-07-10 (×5): 1 [drp] via OPHTHALMIC
  Filled 2017-07-05: qty 5
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
## 2017-07-05 NOTE — Progress Notes (Signed)
Social Work Patient ID: Melvin [name], male   DOB: 08-12-1950, 67 y.o.   MRN: [id number]  Met with pt to discuss team conference goals min assist sliding board and discharge 6/4. Wife is home sick and not able to come in. Pt to have the hired assist come in. Gathering equipment and follow up needs to be able to place order early for delivery by Monday. Awaiting MD input regarding need for wound vac at home. Continue to work on discharge needs.

## 2017-07-05 NOTE — Progress Notes (Signed)
Wound vac dressing to abdomen changed this PM per order. Patient tolerated well. Denies pain/discomfort. Suction continuous @125 . Will continue to monitor.

## 2017-07-05 NOTE — Progress Notes (Signed)
Physical Therapy Weekly Progress Note  Patient Details  Name: Melvin [name] MRN: [id number] Date of Birth: Jul 15, 1950  Beginning of progress report period: Jun 28, 2017 End of progress report period: Jul 05, 2017  Today's Date: 07/05/2017 PT Individual Time: 1300-1415 PT Individual Time Calculation (min): 75 min   Patient has met 4 of 4 short term goals.  Pt currently requires minA for bed mobility and transfers to assist with management of RLE external fixator and maintaining WB precautions during mobility. Pt able to propel w/c with S using RUE/LLE with slow speed. Out of bed tolerance improving as pain decreases and therapist has experimented with various equipment options to increase tolerance. Pt, wife and friend who will assist with caregiving have participated in sessions to begin training. Will require ongoing practice with wife and caregivers over next several days to increase pt/family comfort with slideboard transfer and prepare for d/c. Therapist, pt and pt's son trial'ed car transfer today; able to transfer into back seat of car with minA/setupA provided by son. Will require at least one additional session of practicing car transfer to ensure preparedness for d/c.   Patient continues to demonstrate the following deficits muscle weakness, muscle joint tightness and muscle paralysis, decreased cardiorespiratoy endurance and decreased sitting balance, decreased postural control, decreased balance strategies and difficulty maintaining precautions and therefore will continue to benefit from skilled PT intervention to increase functional independence with mobility.  Patient progressing toward long term goals..  Continue plan of care.  PT Short Term Goals Week 1:  PT Short Term Goal 1 (Week 1): Pt will perform bed mobility minA supine <>sit  PT Short Term Goal 1 - Progress (Week 1): Met PT Short Term Goal 2 (Week 1): Pt will perform slideboard transfer consistent modA PT Short Term Goal 2  - Progress (Week 1): Met PT Short Term Goal 3 (Week 1): Pt will demo OOB tolerance 4 hours/day outside of therapy sessions PT Short Term Goal 3 - Progress (Week 1): Met PT Short Term Goal 4 (Week 1): Pt will propel w/c x150' with S PT Short Term Goal 4 - Progress (Week 1): Met Week 2:  PT Short Term Goal 1 (Week 2): =LTG due to estimated LOS  Skilled Therapeutic Interventions/Progress Updates: Pt received seated in bed, denies pain and agreeable to treatment. Pt's son at hospital entrance to trial car transfer into vehicle pt plans to take home. Supine>sit minA for RLE management. Slideboard transfer to w/c with setupA and S. Performed car transfer into front seat as the front door opened wider to allow room for therapist/caregiver to assist from in front of pt; able to perform transfer min guard however unable to get RLE with fixator into car d/t limited knee ROM, size of fixator and limited space. Transfer into back seat with son providing minA/setupA; pt able to lift RLE into car with assist once scooted across back seat towards opposite side, then able to adjust and face forward in middle seat. Suspect pt will not tolerate long rides in car d/t uncomfortable positioning with large fixator, but would be able to get home from hospital. Discussed options for renting wheelchair van for longer trips, I.e. Follow up appointments at [facility]. Offered names of companies who provide [employer] but pt reports "I've got a guy". Returned to rehab unit totalA in w/c. W/c <>mat table with setupA. Seated BLE long arc quad 3x15 reps, LLE with 4# ankle weight. Sit <>supine minA for RLE management. Performed RLE hip flexor/quad stretch  off side of mat with support under R foot to improve tolerance. Returned to w/c S, setupA with transfer board. S w/c propulsion to return to room with son, remained seated in w/c, all needs in reach at completion of session.      Therapy Documentation Precautions:   Precautions Precautions: Fall, Other (comment) Precaution Comments: NWB LUE; NWB RLE; TDWB LLE for transfers only Restrictions Weight Bearing Restrictions: Yes LUE Weight Bearing: Non weight bearing RLE Weight Bearing: Non weight bearing LLE Weight Bearing: Touchdown weight bearing   See Function Navigator for Current Functional Status.  Therapy/Group: Individual Therapy  Luberta Mutter 07/05/2017, 2:17 PM

## 2017-07-05 NOTE — Progress Notes (Signed)
Physical Therapy Session Note  Patient Details  Name: Melvin [name] MRN: [id number] Date of Birth: 09-06-1950  Today's Date: 07/05/2017 PT Individual Time: 0820-0900 PT Individual Time Calculation (min): 40 min   Short Term Goals: Week 2:  PT Short Term Goal 1 (Week 2): =LTG due to estimated LOS  Skilled Therapeutic Interventions/Progress Updates:    Pt supine in bed upon therapist arrival. Pt on phone with wife having heated discussion regarding his d/c plan, pt requests time to finish phone call prior to starting therapy session. Pt missed 20 min of scheduled PT time due to phone call. Pt reports 6/10 pain in RLE this AM, receives pain medication from RN at beginning of therapy session. Rolling L/R for brief change, to don pants, and to don LSO dependently with Supervision for rolling with use of bedrails. Supine to sit with min A for LE. Sliding board transfer bed to w/c with min A and setup assist to place board. Pt brushes his teeth with setup assist. Manual w/c propulsion 2 x 150 ft with min A for turning with RUE and LLE. Pt left seated in w/c in room at end of therapy session with needs in reach.  Therapy Documentation Precautions:  Precautions Precautions: Fall, Other (comment) Precaution Comments: NWB LUE; NWB RLE; TDWB LLE for transfers only Restrictions Weight Bearing Restrictions: Yes LUE Weight Bearing: Non weight bearing RLE Weight Bearing: Non weight bearing LLE Weight Bearing: Touchdown weight bearing General: PT Amount of Missed Time (min): 20 Minutes PT Missed Treatment Reason: Other (Comment)(pt on phone with wife planning d/c)   See Function Navigator for Current Functional Status.   Therapy/Group: Individual Therapy  Excell Seltzer, PT, DPT  07/05/2017, 10:09 AM

## 2017-07-05 NOTE — Progress Notes (Signed)
Occupational Therapy Weekly Progress Note  Patient Details  Name: Melvin [name] MRN: [id number] Date of Birth: 1950-10-19  Beginning of progress report period: Jun 27, 2017 End of progress report period: Jul 05, 2017  Today's Date: 07/05/2017 OT Individual Time: 0904-1000 OT Individual Time Calculation (min): 56 min    Patient is making steady progress with OT at this time.  He is able to complete UB bathing and dressing with overall min assist.  LB bathing is completed at a mod assist supine in bed rolling side to side.  He is currently using AE for donning and doffing gripper sock on the LLE as well a threading his pants over the left foot.  He needs max assist to thread pants over the RLE.  Max assist for pulling items over hips with lateral leans during dressing or with toileting in sitting.  Toilet transfers are currently min to mod assist using the sliding board to the drop arm commode.  Feel he is on target for established LTGs set a min to mod assist.  Will continue with current OT POC with expected discharge 6/4.  Family/caregiver education on going as well.   Patient continues to demonstrate the following deficits: muscle weakness and muscle joint tightness and decreased balance strategies and therefore will continue to benefit from skilled OT intervention to enhance overall performance with BADL and Reduce care partner burden.  Patient progressing toward long term goals..  Continue plan of care.  OT Short Term Goals Week 2:  OT Short Term Goal 1 (Week 2): STGS equal to LTGs based on ELOS.  See Care Plan for specific details  Skilled Therapeutic Interventions/Progress Updates:    Pt completed bathing and dressing supine to sit EOB during session.  Min assist for sliding board transfer from the wheelchair to the EOB.  He then worked on UB bathing in sitting and completed this with setup as well as setup for donning pullover shirt.  Transition to supine with max assist for removing  pants off of hips as well as brief, rolling side to side.  He chose to keep these pants on after bathing so had him roll again with min assist for pulling them back up over his hips with therapist assist.  Pt is able to roll with supervision using the rails for support.  He was able to wash his upper legs in supine with HOB up and the transitioned back to sitting EOB with min assist for washing his left lower leg with use of the LH sponge and reacher.  Finished session with transfer back to the wheelchair with min assist using the sliding board.  Pt left in wheelchair with call button and phone in reach.    Therapy Documentation Precautions:  Precautions Precautions: Fall, Other (comment) Precaution Comments: NWB LUE; NWB RLE; TDWB LLE for transfers only Restrictions Weight Bearing Restrictions: Yes LUE Weight Bearing: Non weight bearing RLE Weight Bearing: Non weight bearing LLE Weight Bearing: Touchdown weight bearing   Pain: Pain Assessment Pain Scale: 0-10 Pain Score: 6  Pain Type: Acute pain Pain Location: Back Pain Orientation: Lower Pain Descriptors / Indicators: Aching Pain Frequency: Constant Pain Onset: On-going Pain Intervention(s): Medication (See eMAR) ADL: See Function Navigator for Current Functional Status.   Therapy/Group: Individual Therapy  Taariq Leitz OTR/L 07/05/2017, 3:50 PM

## 2017-07-06 ENCOUNTER — Inpatient Hospital Stay (HOSPITAL_COMMUNITY): Payer: BLUE CROSS/BLUE SHIELD | Admitting: Occupational Therapy

## 2017-07-06 ENCOUNTER — Encounter (HOSPITAL_COMMUNITY): Payer: Self-pay | Admitting: Physical Medicine and Rehabilitation

## 2017-07-06 ENCOUNTER — Inpatient Hospital Stay (HOSPITAL_COMMUNITY): Payer: BLUE CROSS/BLUE SHIELD | Admitting: Physical Therapy

## 2017-07-06 DIAGNOSIS — K5901 Slow transit constipation: Secondary | ICD-10-CM | POA: Insufficient documentation

## 2017-07-06 LAB — URINALYSIS, ROUTINE W REFLEX MICROSCOPIC
BILIRUBIN URINE: NEGATIVE
Glucose, UA: NEGATIVE mg/dL
Hgb urine dipstick: NEGATIVE
KETONES UR: NEGATIVE mg/dL
Leukocytes, UA: NEGATIVE
NITRITE: NEGATIVE
Protein, ur: NEGATIVE mg/dL
Specific Gravity, Urine: 1.014 (ref 1.005–1.030)
pH: 7 (ref 5.0–8.0)

## 2017-07-06 LAB — GLUCOSE, CAPILLARY
GLUCOSE-CAPILLARY: 130 mg/dL — AB (ref 65–99)
Glucose-Capillary: 123 mg/dL — ABNORMAL HIGH (ref 65–99)
Glucose-Capillary: 155 mg/dL — ABNORMAL HIGH (ref 65–99)
Glucose-Capillary: 113 mg/dL — ABNORMAL HIGH (ref 65–99)

## 2017-07-06 MED ORDER — SENNOSIDES-DOCUSATE SODIUM 8.6-50 MG PO TABS
2.0000 | ORAL_TABLET | Freq: Two times a day (BID) | ORAL | Status: DC
  Administered 2017-07-06 – 2017-07-09 (×8): 2 via ORAL
  Filled 2017-07-06 (×8): qty 2

## 2017-07-06 MED ORDER — CYCLOBENZAPRINE HCL 5 MG PO TABS: 5.0000 mg | ORAL_TABLET | Freq: Three times a day (TID) | ORAL | Status: DC | PRN

## 2017-07-06 MED ORDER — FLEET ENEMA 7-19 GM/118ML RE ENEM: 1.0000 | ENEMA | Freq: Once | RECTAL | Status: DC

## 2017-07-06 MED ORDER — BISACODYL 10 MG RE SUPP
10.0000 mg | Freq: Once | RECTAL | Status: AC
  Administered 2017-07-06: 10 mg via RECTAL
  Filled 2017-07-06: qty 1

## 2017-07-06 MED ORDER — METHOCARBAMOL 500 MG PO TABS
500.0000 mg | ORAL_TABLET | Freq: Four times a day (QID) | ORAL | Status: DC | PRN
  Administered 2017-07-07 – 2017-07-10 (×5): 500 mg via ORAL
  Filled 2017-07-06 (×5): qty 1

## 2017-07-06 MED ORDER — OXYCODONE HCL 5 MG PO TABS
5.0000 mg | ORAL_TABLET | Freq: Four times a day (QID) | ORAL | Status: DC | PRN
  Administered 2017-07-07 – 2017-07-10 (×5): 5 mg via ORAL
  Filled 2017-07-06 (×5): qty 1

## 2017-07-06 NOTE — Progress Notes (Signed)
Occupational Therapy Session Note  Patient Details  Name: Melvin [name] MRN: [id number] Date of Birth: Jun 22, 1950  Today's Date: 07/06/2017 OT Individual Time: 0998-3382 OT Individual Time Calculation (min): 40 min    Short Term Goals: Week 2:  OT Short Term Goal 1 (Week 2): STGS equal to LTGs based on ELOS.  See Care Plan for specific details  Skilled Therapeutic Interventions/Progress Updates:    Pt transferred from supine to sit EOB with min assist for positioning the RLE.  Once sitting he completed sliding board transfer to the wheelchair with mod assist secondary to fatigue.  Attempted to locate shoe funnel for education on donning shoe on the left foot but was not able to find one.  Pt was able to donn shoe earlier with use of the reacher, shoe funnel, and elastic laces.  Did show pt a picture of the shoe funnel and provided education on how it will help as well as purchase locations.  Next, therapist pushed pt to the dayroom where he completed three 3 minute sets using the RUE only on the ergonometer.  First set completed at level 5 with the final 2 sets completed on level 8.  Brief 1-2 minute rest breaks between each set.  Once completed, pt was pushed back to the room via wheelchair with caregiver present.    Therapy Documentation Precautions:  Precautions Precautions: Fall, Other (comment) Precaution Comments: NWB LUE; NWB RLE; TDWB LLE for transfers only Restrictions Weight Bearing Restrictions: Yes LUE Weight Bearing: Non weight bearing RLE Weight Bearing: Non weight bearing LLE Weight Bearing: Touchdown weight bearing  Pain: Pain Assessment Pain Scale: Faces Pain Score: 0-No pain Faces Pain Scale: Hurts a little bit Pain Type: Acute pain Pain Location: Back Pain Orientation: Lower Pain Onset: With Activity Pain Intervention(s): Medication (See eMAR);Repositioned ADL: See Function Navigator for Current Functional Status.   Therapy/Group: Individual  Therapy  Keily Lepp OTR/L 07/06/2017, 3:57 PM
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
## 2017-07-06 NOTE — Progress Notes (Signed)
Ordered to change wound vac today.  PA wanted to be present once removed to evaluate whether the [facility] was still needed or not.  Pam recommended doing moist to dry dressing from here on out and discontinue the vac.  Packed lower right tunneling area with moist saline gauze.  Covered with abd pad and tape.  Brita Romp, RN

## 2017-07-06 NOTE — Progress Notes (Signed)
Occupational Therapy Session Note  Patient Details  Name: Melvin [name] MRN: [id number] Date of Birth: Jun 24, 1950  Today's Date: 07/06/2017 OT Individual Time: 1002-1059 OT Individual Time Calculation (min): 57 min    Short Term Goals: Week 2:  OT Short Term Goal 1 (Week 2): STGS equal to LTGs based on ELOS.  See Care Plan for specific details  Skilled Therapeutic Interventions/Progress Updates:    Pt completed bathing and dressing supine to sit during session.  He was able to complete UB bathing with supervision as well as washing his peri area, upper legs, and left lower leg with use of the LH sponge and HOB elevated.  He needed assist for washing his back in sidelying as well as buttocks.  Therapist assisted with donning brief with pt rolling in bed as well.  He was able to use the reacher with HOB elevated to donn pants over the LLE and then caregiver assisted with donning it over the right.  He transitioned to supine for rolling to pull them up over his hips with mod assist.  He transitioned to sitting on the EOB and utilized sockaide for donning gripper sock over the LLE.  Finished session with sliding board transfer to the wheelchair with min assist.  Pt left in the wheelchair with caregiver present for support and call button in reach.    Therapy Documentation Precautions:  Precautions Precautions: Fall, Other (comment) Precaution Comments: NWB LUE; NWB RLE; TDWB LLE for transfers only Restrictions Weight Bearing Restrictions: Yes LUE Weight Bearing: Non weight bearing RLE Weight Bearing: Non weight bearing LLE Weight Bearing: Touchdown weight bearing   Pain: Pain Assessment Pain Scale: Faces Pain Score: 0-No pain Faces Pain Scale: Hurts a little bit Pain Type: Acute pain Pain Location: Back Pain Orientation: Lower Pain Onset: With Activity Pain Intervention(s): Medication (See eMAR);Repositioned ADL:  See Function Navigator for Current Functional  Status.   Therapy/Group: Individual Therapy  Alexsandria Kivett OTR/L 07/06/2017, 12:54 PM

## 2017-07-06 NOTE — Progress Notes (Signed)
Occupational Therapy Session Note  Patient Details  Name: Melvin [name] MRN: [id number] Date of Birth: [birth year]  Today's Date: 07/06/2017 OT Individual Time: 1135-1200 OT Individual Time Calculation (min): 25 min   Short Term Goals: Week 2:  OT Short Term Goal 1 (Week 2): STGS equal to LTGs based on ELOS.  See Care Plan for specific details  Skilled Therapeutic Interventions/Progress Updates:    Pt greeted seated in wc with caregiver present. Worked on LB dressing donning/doffing L shoe. OT provided pt with elastic shoe laces and thread them into shoe. Educated pt on use of shoe horn with pt needing min A initially, then blocked practice with improved ease with repetitions. Pt set-up for lunch and was left seated in wc with needs met.   Therapy Documentation Precautions:  Precautions Precautions: Fall, Other (comment) Precaution Comments: NWB LUE; NWB RLE; TDWB LLE for transfers only Restrictions Weight Bearing Restrictions: Yes LUE Weight Bearing: Non weight bearing RLE Weight Bearing: Non weight bearing LLE Weight Bearing: Touchdown weight bearing Pain: Pain Assessment Pain Scale: Faces Pain Score: 0-No pain Faces Pain Scale: Hurts a little bit Pain Type: Acute pain Pain Location: Back Pain Orientation: Lower Pain Onset: With Activity Pain Intervention(s): Repositioned  See Function Navigator for Current Functional Status.   Therapy/Group: Individual Therapy  Valma Cava 07/06/2017, 1:10 PM

## 2017-07-06 NOTE — Progress Notes (Signed)
[place] PHYSICAL MEDICINE & REHABILITATION     PROGRESS NOTE  Subjective/Complaints:  Patient seen lying in bed this morning. He states he slept fairly overnight. He requests positive reinforcement and assurance that he is going to get better.  ROS: denies CP, SOB, nausea, vomiting, diarrhea.  Objective: Vital Signs: Blood pressure 139/71, pulse 71, temperature 98.3 F (36.8 C), temperature source Oral, resp. rate 18, height 6' (1.829 m), weight 88.5 kg (195 lb), SpO2 95 %. No results found. No results for input(s): WBC, HGB, HCT, PLT in the last 72 hours. No results for input(s): NA, K, CL, GLUCOSE, BUN, CREATININE, CALCIUM in the last 72 hours.  Invalid input(s): CO CBG (last 3)  Recent Labs    07/05/17 1633 07/05/17 2119 07/06/17 0635  GLUCAP 103* 164* 123*    Wt Readings from Last 3 Encounters:  06/27/17 88.5 kg (195 lb)  06/26/17 92.1 kg (203 lb)  02/19/17 95.2 kg (209 lb 12.8 oz)    Physical Exam:  BP 139/71   Pulse 71   Temp 98.3 F (36.8 C) (Oral)   Resp 18   Ht 6' (1.829 m)   Wt 88.5 kg (195 lb)   SpO2 95%   BMI 26.45 kg/m  Constitutional: He appears well-developed and well-nourished. NAD. HENT: Normocephalic and atraumatic.  Eyes: EOM are normal. No discharge.  Cardiovascular: RRR. No JVD. Respiratory: Effort normal and breath sounds normal.  GI: Bowel sounds are normal. Nondistended. Musculoskeletal: Ring fixator in place on right ankle. LUE with long arm splint.  Neurological: He is alert and oriented.  Motor: Right upper extremity: 4/5 proximal distal Left upper extremity: Shoulder abduction 4/5 elbow and wrist braced, hand grip 3/5 Left lower extremity: Hip flexion, knee extension, ankle dorsiflexion 4--4/5 Right lower extremity: Hip flexion, knee extension 3/5, wiggles toes (unchanged) Skin:  Incision right thigh and right flank C/D/I Right elbow abrasions with dressing C/D/I.  Abdominal wound with VAC Psychiatric: He has a normal mood  and affect. His behavior is normal. Thought content normal.    Assessment/Plan: 1. Functional deficits secondary to Polytrauma which require 3+ hours per day of interdisciplinary therapy in a comprehensive inpatient rehab setting. Physiatrist is providing close team supervision and 24 hour management of active medical problems listed below. Physiatrist and rehab team continue to assess barriers to discharge/monitor patient progress toward functional and medical goals.  Function:  Bathing Bathing position   Position: Bed  Bathing parts Body parts bathed by patient: Right arm, Left arm, Chest, Abdomen, Front perineal area, Right upper leg, Left upper leg, Left lower leg Body parts bathed by helper: Buttocks, Back  Bathing assist Assist Level: Touching or steadying assistance(Pt > 75%)      Upper Body Dressing/Undressing Upper body dressing   What is the patient wearing?: Pull over shirt/dress, Orthosis     Pull over shirt/dress - Perfomed by patient: Thread/unthread right sleeve, Thread/unthread left sleeve, Put head through opening, Pull shirt over trunk Pull over shirt/dress - Perfomed by helper: Pull shirt over trunk     Orthosis activity level: Performed by helper  Upper body assist Assist Level: Supervision or verbal cues      Lower Body Dressing/Undressing Lower body dressing Lower body dressing/undressing activity did not occur: N/A What is the patient wearing?: Pants, Non-skid slipper socks     Pants- Performed by patient: (pt kept pants on and then pulled over hips) Pants- Performed by helper: Pull pants up/down Non-skid slipper socks- Performed by patient: Don/doff left sock Non-skid  slipper socks- Performed by helper: Don/doff left sock                  Lower body assist Assist for lower body dressing: Touching or steadying assistance (Pt > 75%)(Mod assist)      Toileting Toileting Toileting activity did not occur: Safety/medical concerns   Toileting  steps completed by helper: Adjust clothing prior to toileting, Adjust clothing after toileting(simulated did not have BM)    Toileting assist Assist level: (total assist)   Transfers Chair/bed transfer   Chair/bed transfer method: Lateral scoot Chair/bed transfer assist level: Touching or steadying assistance (Pt > 75%) Chair/bed transfer assistive device: Armrests, Sliding board     Locomotion Ambulation Ambulation activity did not occur: Safety/medical concerns(WB precautions)         Wheelchair   Type: Manual Max wheelchair distance: 150' Assist Level: Touching or steadying assistance (Pt > 75%)  Cognition Comprehension Comprehension assist level: Understands complex 90% of the time/cues 10% of the time  Expression Expression assist level: Expresses complex 90% of the time/cues < 10% of the time  Social Interaction Social Interaction assist level: Interacts appropriately 90% of the time - Needs monitoring or encouragement for participation or interaction.  Problem Solving Problem solving assist level: Solves complex problems: With extra time  Memory Memory assist level: More than reasonable amount of time    Medical Problem List and Plan: 1. Deficits with mobility, transfers, self-care secondary to polytrauma.   Continue CIR 2. DVT Prophylaxis/Anticoagulation: Pharmaceutical: Other (comment)--Eliquis 3. Pain Management: tylenol and/or oxycodone prn effective.  4. Mood: LCSW to follow for evaluation and support.    Requires positive reinforcement and reassurance 5. Neuropsych: This patient is capable of making decisions on his own behalf. 6. Skin/Wound Care: Wound VAC to midline incision--change MWF. Daily drain care with sterile flushes--to stay in place till follow up at [facility]  See notes for wound  7. Fluids/Electrolytes/Nutrition: Monitor I/O.  8. Klebsiella UTI:   Changed over to po antibiotics on 5/22, omnicef completed on 5/26.  9. T2DM: Continue to monitor BS ac/hs  and use SSI for tighter control.   Metformin increased to twice a day on 5/29    CBG (last 3)  Recent Labs    07/05/17 1633 07/05/17 2119 07/06/17 0635  GLUCAP 103* 164* 123*   ? Trending up on 5/31 10. H/o depression: Managed with prozac. Has good outlookat present. 11. ABLA:    Hemoglobin 10.1 on 5/27  Continue to monitor 12. AKI: Resolved   Encourage fluid intake.  13. Hypomagnesemia: added mag ox.    Magnesium 1.7 on 5/23   Continue to monitor 14. Sleep disturbance  Cont Melatonin 15. Hypertension  Lisinopril 5 mg daily, Changed to 2.5 mg twice a day on 5/30   Vitals:   07/06/17 0345 07/06/17 0815  BP: (!) 145/71 139/71  Pulse: 71   Resp: 18   Temp: 98.3 F (36.8 C)   SpO2: 95%    Slightly elevated, however relatively controlled on 5/31 16. Hypoalbuminemia  Supplement initiated on 5/23 17. Constipation  Continue bowel meds, increased on 5/31 18.  Urinary retention with UTI- had ant sacral hematoma  ?Sacral plexus injury, Omnicef completed  Flomax increased on 5/29  Added Urecholine, will consider further increase tomorrow  ?Improving 20. Dry eyes  Artificial tears ordered on 5/30  LOS (Days) 9 A FACE TO FACE EVALUATION WAS PERFORMED  Melvin [name] Phenix 07/06/2017 8:29 AM

## 2017-07-06 NOTE — Progress Notes (Signed)
Physical Therapy Session Note  Patient Details  Name: Melvin [name] MRN: [id number] Date of Birth: 1950/06/20  Today's Date: 07/06/2017 PT Individual Time: 0830-0945 PT Individual Time Calculation (min): 75 min   Short Term Goals: Week 2:  PT Short Term Goal 1 (Week 2): =LTG due to estimated LOS  Skilled Therapeutic Interventions/Progress Updates: Pt received supine in bed, c/o increased RLE pain overnight as below and difficulty sleeping d/t pain and being uncomfortable on his back. Declines OOB at this time, reports he needs to have bowel movement but does not want to try getting on BSC due to pain, but has also been having difficulty voiding on bedpan. Rolling R/L to doff brief and place bedpan; requires increased time on bedpan to attempt voiding. TotalA hygiene and clothing management after voiding. Supine>sit minA. Slideboard transfer to w/c with S, setupA for board placement and stabilizing during transfer. Pt's caregiver arrived to participate in session. Pt propelled w/c with S, caregiver brought L tennis shoe to increase L grip on floor and efficiency of propulsion. BLE hamstring stretch x2 min each on step. Educated caregiver in car transfer regarding details and technique for transferring into mercedes car pt plans to go home in. Performed transfer with pt directing caregiver, therapist providing minimal cues for technique, and minA overall. Educated re: head/hips relationship to improve technique with scooting in/out of car. W/c propulsion to return to room with S; pt remained in w/c in hallway with RN administering medications and caregiver present.      Therapy Documentation Precautions:  Precautions Precautions: Fall, Other (comment) Precaution Comments: NWB LUE; NWB RLE; TDWB LLE for transfers only Restrictions Weight Bearing Restrictions: Yes LUE Weight Bearing: Non weight bearing RLE Weight Bearing: Non weight bearing LLE Weight Bearing: Touchdown weight  bearing Pain: Pain Assessment Pain Scale: 0-10 Pain Score: 7  Pain Type: Acute pain Pain Location: Back Pain Descriptors / Indicators: Aching;Constant Pain Frequency: Constant Pain Onset: On-going Patients Stated Pain Goal: 1 Pain Intervention(s): Medication (See eMAR)   See Function Navigator for Current Functional Status.   Therapy/Group: Individual Therapy  Luberta Mutter 07/06/2017, 9:44 AM

## 2017-07-06 NOTE — Progress Notes (Signed)
Patient has home CPAP unit at bedside and states he places self on when ready. RT informed patient if he has any trouble have RN contact RT.

## 2017-07-06 NOTE — Progress Notes (Signed)
Attempted to educate patient on self-catheterization using samples from coloplast.  RN was unable to pass catheter beyond the prostate.  Patient was in considerable amount of pain.  Retracted catheter and small amount of bright red blood noted to be on tip of penis.  Catheterized patient using the hospitals intermittent cath kit and obtained 500ml of clear yellow urine.  Reardon, Whitney J, RN  

## 2017-07-06 NOTE — Progress Notes (Signed)
Social Work Patient ID: Melvin [name], male   DOB: 1950-06-18, 67 y.o.   MRN: [id number]  Spoke with pt and wife left message regarding plan on going to [facility] apartment when discharged from here. He does not want to move everything around in their home and feels this will be short term solution.have ordered equipment and have asked them to deliver Monday so all is set when pt discharged on Tuesday. Caregiver here to attend therapies and learn his care. According to Pam-PA and MD pt does not need to go home with a wound vac. He has appointments to follow up at [facility] on Wednesday and Thursday of next week. Continue to work on plans.

## 2017-07-07 ENCOUNTER — Inpatient Hospital Stay (HOSPITAL_COMMUNITY): Payer: BLUE CROSS/BLUE SHIELD | Admitting: Physical Therapy

## 2017-07-07 LAB — GLUCOSE, CAPILLARY
GLUCOSE-CAPILLARY: 122 mg/dL — AB (ref 65–99)
GLUCOSE-CAPILLARY: 116 mg/dL — AB (ref 65–99)
Glucose-Capillary: 151 mg/dL — ABNORMAL HIGH (ref 65–99)
Glucose-Capillary: 118 mg/dL — ABNORMAL HIGH (ref 65–99)

## 2017-07-07 LAB — URINE CULTURE: Culture: NO GROWTH

## 2017-07-07 NOTE — Progress Notes (Signed)
[place] PHYSICAL MEDICINE & REHABILITATION     PROGRESS NOTE  Subjective/Complaints:  Patient seen lying in bed this morning. He states that well overnight. He states he would like to be able to urinate. Long discussion with wife yesterday, please see PA note.  ROS: denies CP, SOB, nausea, vomiting, diarrhea.  Objective: Vital Signs: Blood pressure 132/77, pulse 83, temperature 98.3 F (36.8 C), temperature source Oral, resp. rate (!) 21, height 6' (1.829 m), weight 88.5 kg (195 lb), SpO2 97 %. No results found. No results for input(s): WBC, HGB, HCT, PLT in the last 72 hours. No results for input(s): NA, K, CL, GLUCOSE, BUN, CREATININE, CALCIUM in the last 72 hours.  Invalid input(s): CO CBG (last 3)  Recent Labs    07/07/17 0635 07/07/17 1144 07/07/17 1642  GLUCAP 122* 151* 116*    Wt Readings from Last 3 Encounters:  06/27/17 88.5 kg (195 lb)  06/26/17 92.1 kg (203 lb)  02/19/17 95.2 kg (209 lb 12.8 oz)    Physical Exam:  BP 132/77 (BP Location: Right Arm)   Pulse 83   Temp 98.3 F (36.8 C) (Oral)   Resp (!) 21   Ht 6' (1.829 m)   Wt 88.5 kg (195 lb)   SpO2 97%   BMI 26.45 kg/m  Constitutional: He appears well-developed and well-nourished. NAD. HENT: Normocephalic and atraumatic.  Eyes: EOM are normal. No discharge.  Cardiovascular: RRR. No JVD. Respiratory: Effort normal and breath sounds normal.  GI: Bowel sounds are normal. Nondistended. Musculoskeletal: Ring fixator in place on right ankle. LUE with long arm splint.  Neurological: He is alert and oriented.  Motor: Right upper extremity: 4/5 proximal distal Left upper extremity: Shoulder abduction 4/5 elbow and wrist braced, hand grip 3/5 (stable) Left lower extremity: Hip flexion, knee extension, ankle dorsiflexion 4--4/5 Right lower extremity: Hip flexion, knee extension 3/5, wiggles toes (stable) Skin:  Incision right thigh and right flank C/D/I Right elbow abrasions with dressing C/D/I.   Abdominal wound with VAC Psychiatric: He has a normal mood and affect. His behavior is normal. Thought content normal.    Assessment/Plan: 1. Functional deficits secondary to Polytrauma which require 3+ hours per day of interdisciplinary therapy in a comprehensive inpatient rehab setting. Physiatrist is providing close team supervision and 24 hour management of active medical problems listed below. Physiatrist and rehab team continue to assess barriers to discharge/monitor patient progress toward functional and medical goals.  Function:  Bathing Bathing position   Position: Bed  Bathing parts Body parts bathed by patient: Right arm, Left arm, Chest, Abdomen, Front perineal area, Right upper leg, Left upper leg, Left lower leg Body parts bathed by helper: Back, Buttocks, Right lower leg  Bathing assist Assist Level: Touching or steadying assistance(Pt > 75%)      Upper Body Dressing/Undressing Upper body dressing   What is the patient wearing?: Pull over shirt/dress, Orthosis     Pull over shirt/dress - Perfomed by patient: Thread/unthread right sleeve, Thread/unthread left sleeve, Put head through opening Pull over shirt/dress - Perfomed by helper: Pull shirt over trunk     Orthosis activity level: Performed by helper  Upper body assist Assist Level: Supervision or verbal cues      Lower Body Dressing/Undressing Lower body dressing Lower body dressing/undressing activity did not occur: N/A What is the patient wearing?: Shoes     Pants- Performed by patient: Thread/unthread left pants leg Pants- Performed by helper: Pull pants up/down, Thread/unthread right pants leg Non-skid slipper socks-  Performed by patient: Don/doff right sock Non-skid slipper socks- Performed by helper: Don/doff left sock(left sock NA)     Shoes - Performed by patient: Don/doff left shoe, Fasten left Shoes - Performed by helper: Don/doff left shoe, Fasten left          Lower body assist Assist  for lower body dressing: Assistive device Assistive Device Comment: shoe horn, eastic shoe laces    Toileting Toileting Toileting activity did not occur: Safety/medical concerns   Toileting steps completed by helper: Adjust clothing prior to toileting, Adjust clothing after toileting(simulated did not have BM)    Toileting assist Assist level: (total assist)   Transfers Chair/bed transfer   Chair/bed transfer method: Lateral scoot Chair/bed transfer assist level: Supervision or verbal cues Chair/bed transfer assistive device: Armrests, Sliding board     Locomotion Ambulation Ambulation activity did not occur: Safety/medical concerns(WB precautions)         Wheelchair   Type: Manual Max wheelchair distance: 150 Assist Level: Supervision or verbal cues  Cognition Comprehension Comprehension assist level: Understands complex 90% of the time/cues 10% of the time  Expression Expression assist level: Expresses complex 90% of the time/cues < 10% of the time  Social Interaction Social Interaction assist level: Interacts appropriately 90% of the time - Needs monitoring or encouragement for participation or interaction.  Problem Solving Problem solving assist level: Solves complex problems: With extra time  Memory Memory assist level: More than reasonable amount of time    Medical Problem List and Plan: 1. Deficits with mobility, transfers, self-care secondary to polytrauma.   Continue CIR 2. DVT Prophylaxis/Anticoagulation: Pharmaceutical: Other (comment)--Eliquis 3. Pain Management: tylenol and/or oxycodone prn effective.  4. Mood: LCSW to follow for evaluation and support.    Requires positive reinforcement and reassurance 5. Neuropsych: This patient is capable of making decisions on his own behalf. 6. Skin/Wound Care: Wound VAC to midline incision--change MWF. Daily drain care with sterile flushes--to stay in place till follow up at [facility]  See notes for wound  7.  Fluids/Electrolytes/Nutrition: Monitor I/O.  8. Klebsiella UTI:   Changed over to po antibiotics on 5/22, omnicef completed on 5/26.  9. T2DM: Continue to monitor BS ac/hs and use SSI for tighter control.   Metformin increased to twice a day on 5/29    CBG (last 3)  Recent Labs    07/07/17 0635 07/07/17 1144 07/07/17 1642  GLUCAP 122* 151* 116*   Relatively controlled on 6/1 10. H/o depression: Managed with prozac. Has good outlookat present. 11. ABLA:    Hemoglobin 10.1 on 5/27  Continue to monitor 12. AKI: Resolved   Encourage fluid intake.  13. Hypomagnesemia: added mag ox.    Magnesium 1.7 on 5/23   Continue to monitor 14. Sleep disturbance  Cont Melatonin 15. Hypertension  Lisinopril 5 mg daily, Changed to 2.5 mg twice a day on 5/30   Vitals:   07/07/17 0508 07/07/17 1538  BP: (!) 167/76 132/77  Pulse: 79 83  Resp: 18 (!) 21  Temp: 97.7 F (36.5 C) 98.3 F (36.8 C)  SpO2: 99% 97%   ?Trending up on 6/1 16. Hypoalbuminemia  Supplement initiated on 5/23 17. Constipation  Continue bowel meds, increased on 5/31 18.  Urinary retention with UTI- had ant sacral hematoma  ?Sacral plexus injury, Omnicef completed  Flomax increased on 5/29  Added Urecholine, will consider further increase tomorrow  ?Improving 20. Dry eyes  Artificial tears ordered on 5/30  >35 minutes spent with patient and family  with greater than 30 minutes in counseling regarding after mentioned issues. Please see PA note.  LOS (Days) 10 A FACE TO FACE EVALUATION WAS PERFORMED  Mayar Whittier Lorie Phenix 07/07/2017 8:28 PM

## 2017-07-07 NOTE — Progress Notes (Signed)
Physical Therapy Session Note  Patient Details  Name: Melvin [name] MRN: [id number] Date of Birth: 1950-03-31  Today's Date: 07/07/2017 PT Individual Time: 1000-1055 PT Individual Time Calculation (min): 55 min   Short Term Goals: Week 2:  PT Short Term Goal 1 (Week 2): =LTG due to estimated LOS  Skilled Therapeutic Interventions/Progress Updates: Pt received seated in bed on bedpan, denies pain and agreeable to treatment. Rolling with S to get off bedpan and perform hygiene and don brief/pants totalA. Supine>sit minA for RLE management. Transfer bed>w/c with setupA and therapist stabilizing board during transfer. Discussed d/c plan with pt; planning to d/c to assisted living apartment where caregiver [facility] will provide caregiving assist during the day, feels comfortable performing transfers w/c <> bed and BSC and performing in/out cath. W/c propulsion 2x150' with RUE/LLE for strengthening and endurance with S. BLE long arc quads, seated hip flexion marching with 3# weight LLE 2x15 reps. RUE bicep curls, tricep extension with 6# weight 2x15 reps. Returned to room as above. Pt requesting assistance to perform bathing as he did not have another therapy session today; therapist set pt up at sink, S for doffing shirt and back brace; alerted NT to pt position and pt agreeable to call NT when finished with as much as he can bathe independently.     Therapy Documentation Precautions:  Precautions Precautions: Fall, Other (comment) Precaution Comments: NWB LUE; NWB RLE; TDWB LLE for transfers only Restrictions Weight Bearing Restrictions: Yes LUE Weight Bearing: Non weight bearing RLE Weight Bearing: Non weight bearing LLE Weight Bearing: Touchdown weight bearing  See Function Navigator for Current Functional Status.   Therapy/Group: Individual Therapy  Luberta Mutter 07/07/2017, 10:55 AM

## 2017-07-07 NOTE — Progress Notes (Signed)
Patient has home CPAP and will place himself on and off when ready. RT will continue to monitor.

## 2017-07-08 ENCOUNTER — Inpatient Hospital Stay (HOSPITAL_COMMUNITY): Payer: BLUE CROSS/BLUE SHIELD

## 2017-07-08 LAB — GLUCOSE, CAPILLARY
GLUCOSE-CAPILLARY: 134 mg/dL — AB (ref 65–99)
Glucose-Capillary: 123 mg/dL — ABNORMAL HIGH (ref 65–99)
Glucose-Capillary: 99 mg/dL (ref 65–99)
Glucose-Capillary: 153 mg/dL — ABNORMAL HIGH (ref 65–99)
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
## 2017-07-08 NOTE — Progress Notes (Signed)
Used coude catheter with lidocaine for intermittent cath this time.  Was able to insert with no resistance met, and caused less pain for patient.  Small amount of blood noted on tip of catheter and penis post catheterization, but not as much as seen with regular straight catheter.  Will advise team to continue using coude catheter to prevent trauma and reduce pain.  Brita Romp, RN

## 2017-07-08 NOTE — Progress Notes (Signed)
Occupational Therapy Session Note  Patient Details  Name: Marquavious C Schuman MRN: [id number] Date of Birth: 03/19/1950  Today's Date: 07/08/2017 OT Individual Time: 1515-1612 OT Individual Time Calculation (min): 57 min    Short Term Goals: Week 1:  OT Short Term Goal 1 (Week 1): STGS equal to LTGs based on ELOS.  See Care Plan for specific details  Skilled Therapeutic Interventions/Progress Updates:    1:1. Pt staing pain 5/10 which is "better" after the medication from RN. Pt bathes/dresses in supine HOB elevated with A to wash back and buttocks. Pt able to don shirt with A to pull down in back and A to apply orthosis. Pt dons pants and sock with A to thread RLE into pants over external fixator as well as advance pants past L hip. Pt brushes teeth with HOB elevated with set up. Pt propels w/c with LLE and RUE to outside courtyard for improved endurance requires for community mobility distances as well as navigating elevators backing in/out. Pt seated in w/c outside with questions regarding transportation to drs appointments over an hour away. OT referred pt to discuss with SW for any community transportation available. Exited session with pt seated in bed, call light in reach and all needs met.   Therapy Documentation Precautions:  Precautions Precautions: Fall, Other (comment) Precaution Comments: NWB LUE; NWB RLE; TDWB LLE for transfers only Restrictions Weight Bearing Restrictions: Yes LUE Weight Bearing: Non weight bearing RLE Weight Bearing: Non weight bearing LLE Weight Bearing: Touchdown weight bearing  See Function Navigator for Current Functional Status.   Therapy/Group: Individual Therapy  Stephanie M Schlosser 07/08/2017, 4:13 PM 

## 2017-07-08 NOTE — Progress Notes (Signed)
During ordered flush of JP drain, serosanguinous fluid noted to be leaking from catheter entry/exit site.  Small amount of bloody drainage emptied from bulb (29ml) before flush and an additional 5 ml returned after flush.  Dressing noted to be peeling off at the borders.  Reinforced dressing with split gauze and paper tape.  Patient could benefit from new anchoring device, if appropriate.  Brita Romp, RN

## 2017-07-08 NOTE — Plan of Care (Signed)
  Problem: Consults Goal: RH GENERAL PATIENT EDUCATION Description See Patient Education module for education specifics. Outcome: Progressing Goal: Skin Care Protocol Initiated - if Braden Score 18 or less Description If consults are not indicated, leave blank or document N/A Outcome: Progressing Goal: Nutrition Consult-if indicated Outcome: Progressing   Problem: RH BOWEL ELIMINATION Goal: RH STG MANAGE BOWEL WITH ASSISTANCE Description STG Manage Bowel with mod Assistance.  Outcome: Progressing Goal: RH STG MANAGE BOWEL W/MEDICATION W/ASSISTANCE Description STG Manage Bowel with Medication with mod Assistance.  Outcome: Progressing Goal: RH STG MANAGE BOWEL W/EQUIPMENT W/ASSISTANCE Description STG Manage Bowel With Equipment With mod.Assistance  Outcome: Progressing   Problem: RH BLADDER ELIMINATION Goal: RH STG MANAGE BLADDER WITH MEDICATION WITH ASSISTANCE Description STG Manage Bladder With Medication With mod Assistance.  Outcome: Progressing   Problem: RH SKIN INTEGRITY Goal: RH STG SKIN FREE OF INFECTION/BREAKDOWN Outcome: Progressing Goal: RH STG MAINTAIN SKIN INTEGRITY WITH ASSISTANCE Description STG Maintain Skin Integrity With mod Assistance.  Outcome: Progressing   Problem: RH SAFETY Goal: RH STG ADHERE TO SAFETY PRECAUTIONS W/ASSISTANCE/DEVICE Description STG Adhere to Safety Precautions With mod Assistance/Device.  Outcome: Progressing Goal: RH STG DEMO UNDERSTANDING HOME SAFETY PRECAUTIONS Outcome: Progressing   Problem: RH PAIN MANAGEMENT Goal: RH STG PAIN MANAGED AT OR BELOW PT'S PAIN GOAL Description Less than 3.  Outcome: Progressing Goal: RH OTHER STG PAIN MANAGEMENT GOALS W/ASSIST Description Other STG Pain Management Goals With min Assistance.  Outcome: Progressing   Problem: RH KNOWLEDGE DEFICIT GENERAL Goal: RH STG INCREASE KNOWLEDGE OF SELF CARE AFTER HOSPITALIZATION Outcome: Progressing   Problem: RH Other (Specify) Goal: RH  LTG Other (Specify)1 Outcome: Progressing   Problem: RH BLADDER ELIMINATION Goal: RH STG MANAGE BLADDER WITH ASSISTANCE Description STG Manage Bladder With mod Assistance  Outcome: Not Progressing Goal: RH STG MANAGE BLADDER WITH EQUIPMENT WITH ASSISTANCE Description STG Manage Bladder With Equipment With mod Assistance  Outcome: Not Progressing   Problem: RH SKIN INTEGRITY Goal: RH STG ABLE TO PERFORM INCISION/WOUND CARE W/ASSISTANCE Description STG Able To Perform Incision/Wound Care With mod Assistance.  Outcome: Not Progressing   Patient requiring total assist with coude catheter for bladder emptying.  Patient requiring total assist for multiple dressing changes due to limitations in mobility.  Brita Romp, RN

## 2017-07-09 ENCOUNTER — Inpatient Hospital Stay (HOSPITAL_COMMUNITY): Payer: BLUE CROSS/BLUE SHIELD | Admitting: Physical Therapy

## 2017-07-09 ENCOUNTER — Inpatient Hospital Stay (HOSPITAL_COMMUNITY): Payer: BLUE CROSS/BLUE SHIELD | Admitting: Occupational Therapy

## 2017-07-09 ENCOUNTER — Inpatient Hospital Stay (HOSPITAL_COMMUNITY): Payer: BLUE CROSS/BLUE SHIELD

## 2017-07-09 LAB — MAGNESIUM: MAGNESIUM: 2 mg/dL (ref 1.7–2.4)

## 2017-07-09 LAB — CBC
HCT: 34.2 % — ABNORMAL LOW (ref 39.0–52.0)
HEMOGLOBIN: 10.9 g/dL — AB (ref 13.0–17.0)
MCH: 30.5 pg (ref 26.0–34.0)
MCHC: 31.9 g/dL (ref 30.0–36.0)
MCV: 95.8 fL (ref 78.0–100.0)
PLATELETS: 173 10*3/uL (ref 150–400)
RBC: 3.57 MIL/uL — AB (ref 4.22–5.81)
RDW: 16.7 % — ABNORMAL HIGH (ref 11.5–15.5)
WBC: 4.9 10*3/uL (ref 4.0–10.5)

## 2017-07-09 LAB — GLUCOSE, CAPILLARY
GLUCOSE-CAPILLARY: 105 mg/dL — AB (ref 65–99)
GLUCOSE-CAPILLARY: 130 mg/dL — AB (ref 65–99)
Glucose-Capillary: 127 mg/dL — ABNORMAL HIGH (ref 65–99)
Glucose-Capillary: 117 mg/dL — ABNORMAL HIGH (ref 65–99)

## 2017-07-09 LAB — BASIC METABOLIC PANEL
ANION GAP: 8 (ref 5–15)
BUN: 13 mg/dL (ref 6–20)
CO2: 27 mmol/L (ref 22–32)
CREATININE: 0.44 mg/dL — AB (ref 0.61–1.24)
Calcium: 8.7 mg/dL — ABNORMAL LOW (ref 8.9–10.3)
Chloride: 106 mmol/L (ref 101–111)
Glucose, Bld: 129 mg/dL — ABNORMAL HIGH (ref 65–99)
Potassium: 3.8 mmol/L (ref 3.5–5.1)
SODIUM: 141 mmol/L (ref 135–145)

## 2017-07-09 MED ORDER — IOHEXOL 300 MG/ML  SOLN
100.0000 mL | Freq: Once | INTRAMUSCULAR | Status: AC | PRN
  Administered 2017-07-09: 100 mL via INTRAVENOUS

## 2017-07-09 NOTE — Progress Notes (Signed)
RT NOTE:  Pt has home CPAP @ bedside. Pt manages and understands to call RT for assistance

## 2017-07-09 NOTE — Progress Notes (Signed)
Occupational Therapy Session Note  Patient Details  Name: Melvin [name] MRN: [id number] Date of Birth: 30-Dec-1950  Today's Date: 07/09/2017 OT Individual Time: 1003-1056 OT Individual Time Calculation (min): 53 min    Short Term Goals: Week 2:  OT Short Term Goal 1 (Week 2): STGS equal to LTGs based on ELOS.  See Care Plan for specific details  Skilled Therapeutic Interventions/Progress Updates:    Pt completed bathing and dressing in supine during session.  HOB elevated for UB and LB bathing except for washing buttocks.  LH sponge and reacher utilized for washing the LLE and drying it.  He completed all UB bathing and dressing with supervision.  Mod assist for washing buttocks and for pulling garments over hips.  He was able to roll side to side with supervision using the rails for support.  He also assisted with pushing down shorts and brief with min assist for washing.  Finished session in bed as pt has planned CT scan soon and will need transport from bed level.  Call button in reach and caregiver in the room as well.    Therapy Documentation Precautions:  Precautions Precautions: Fall, Other (comment) Precaution Comments: NWB LUE; NWB RLE; TDWB LLE for transfers only Required Braces or Orthoses: Other Brace/Splint Other Brace/Splint: external fixator RLE, LSO applied in supine Restrictions Weight Bearing Restrictions: Yes LUE Weight Bearing: Non weight bearing RLE Weight Bearing: Non weight bearing LLE Weight Bearing: Touchdown weight bearing  Pain: Pain Assessment Pain Scale: 0-10 Pain Score: 5  Pain Type: Acute pain Pain Location: Back Pain Orientation: Lower Pain Descriptors / Indicators: Aching;Discomfort Pain Onset: With Activity Pain Intervention(s): Repositioned;Emotional support ADL: See Function Navigator for Current Functional Status.   Therapy/Group: Individual Therapy  Demaris Leavell OTR/L 07/09/2017, 12:26 PM
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
## 2017-07-09 NOTE — Progress Notes (Signed)
Patient ID: Melvin [name], male   DOB: 1950/10/06, 67 y.o.   MRN: [id number]   CT done and in review  Will let [facility]ing [facility] know something regarding drain asap.

## 2017-07-09 NOTE — Progress Notes (Signed)
Physical Therapy Discharge Summary  Patient Details  Name: Melvin [name] MRN: [id number] Date of Birth: 10/27/1950  Today's Date: 07/09/2017 PT Individual Time: 0700-0800 PT Individual Time Calculation (min): 60 min    Patient has met 8 of 8 long term goals due to improved activity tolerance, increased strength, increased range of motion, decreased pain and ability to compensate for deficits.  Patient to discharge at a wheelchair level [place].   Patient's care partner is independent to provide the necessary physical assistance at discharge.  Reasons goals not met: All goals met  Recommendation:  Patient will benefit from ongoing skilled PT services in home health setting to continue to advance safe functional mobility, address ongoing impairments in strength, ROM, activity tolerance, and minimize fall risk.  Equipment: w/c with ELR, transfer board, hospital bed  Reasons for discharge: treatment goals met and discharge from hospital  Patient/family agrees with progress made and goals achieved: Yes  PT Discharge Precautions/Restrictions Precautions Precautions: Fall;Other (comment) Precaution Comments: NWB LUE; NWB RLE; TDWB LLE for transfers only Required Braces or Orthoses: Other Brace/Splint Other Brace/Splint: external fixator RLE, LSO applied in supine Restrictions Weight Bearing Restrictions: Yes LUE Weight Bearing: Non weight bearing RLE Weight Bearing: Non weight bearing LLE Weight Bearing: Touchdown weight bearing Vital Signs Therapy Vitals Temp: 98.4 F (36.9 C) Temp Source: Oral Pulse Rate: 79 Resp: 18 BP: (!) 151/79 Patient Position (if appropriate): Lying Oxygen Therapy SpO2: 96 % O2 Device: CPAP Pain Pain Assessment Pain Score: Asleep Vision/Perception  Perception Perception: Within Functional Limits Praxis Praxis: Intact  Cognition Overall Cognitive Status: Within Functional Limits for tasks assessed Arousal/Alertness: Awake/alert Orientation  Level: Oriented X4 Sustained Attention: Appears intact Memory: Appears intact Awareness: Appears intact Problem Solving: Appears intact Safety/Judgment: Appears intact Sensation Sensation Light Touch: Appears Intact Proprioception: Appears Intact Coordination Gross Motor Movements are Fluid and Coordinated: Yes Heel Shin Test: NT d/t external fixator Motor  Motor Motor: Within Functional Limits Motor - Skilled Clinical Observations: generalized weakness and limitations secondary to pain in the back and RLE.  Mobility Bed Mobility Bed Mobility: Rolling Right;Rolling Left;Sit to Supine;Supine to Sit Rolling Right: 5: Supervision;With rail Rolling Left: 5: Supervision;With rail Supine to Sit: 5: Supervision;With rails Supine to Sit Details: Verbal cues for technique;Verbal cues for precautions/safety Sit to Supine: 5: Supervision;With rail;HOB elevated Sit to Supine - Details: Verbal cues for technique;Verbal cues for precautions/safety Transfers Transfers: Yes Lateral/Scoot Transfers: 5: Supervision;With slide board Lateral/Scoot Transfer Details: Verbal cues for technique;Verbal cues for precautions/safety;Verbal cues for safe use of DME/AE Lateral/Scoot Transfer Details (indicate cue type and reason): assist to stabilize transfer board Locomotion  Ambulation Ambulation: No Gait Gait: No Stairs / Additional Locomotion Stairs: No Wheelchair Mobility Wheelchair Mobility: Yes Wheelchair Assistance: 6: Modified independent (Device/Increase time) Wheelchair Propulsion: Right upper extremity;Left lower extremity Wheelchair Parts Management: Needs assistance Distance: 150'  Trunk/Postural Assessment  Cervical Assessment Cervical Assessment: Within Functional Limits Thoracic Assessment Thoracic Assessment: Within Functional Limits Lumbar Assessment Lumbar Assessment: Within Functional Limits Postural Control Postural Control: Within Functional Limits   Balance Balance Balance Assessed: Yes Static Sitting Balance Static Sitting - Balance Support: No upper extremity supported Static Sitting - Level of Assistance: 6: Modified independent (Device/Increase time) Dynamic Sitting Balance Dynamic Sitting - Balance Support: Feet supported;During functional activity Dynamic Sitting - Level of Assistance: 6: Modified independent (Device/Increase time) Dynamic Sitting - Balance Activities: Lateral lean/weight shifting;Reaching for objects Extremity Assessment  RUE Assessment RUE Assessment: Within Functional Limits LUE Assessment LUE Assessment: Exceptions to [facility]  LUE Strength LUE Overall Strength Comments: Pt with AROM elbow flexion WFLS with shoulder flexion 0-130 degrees grossly.  Noted splint on the left forearm all the way down to the MPs of the digits and thumb.  Limited AROM of FM and digit movements secondary to this but can use the LUE to assist with washing and dressing tasks.   RLE Assessment RLE Assessment: Exceptions to WFL(ankle NT d/t ext fixator; able to wiggle toes. Hip flexion 2/5, knee extension 3/5, knee flexion 3/5, knee flexion ROM limited to 95 degrees, limitations in superior/inferior patellar mobility) LLE Assessment LLE Assessment: Exceptions to WFL(grossly 4-/5; not formally assessed d/t WB precautions)  Skilled Therapeutic Intervention: Tx 1: Pt received seated in bed, denies pain at rest and agreeable to treatment. SetupA for breakfast; while pt eating therapist and pt discussed d/c planning, including pt's concern about traveling to/from [facility] for follow up appointments later this week. Discussed possibility of staying at handicap accessible hotel between appointments to reduce amount of travel time. Pt plans to follow up with wife. Performed supine>sit with S and HOB elevated. Lateral scoot transfer bed>w/c and w/c <>car with transfer board and S, therapist stabilized board during transfer. W/c propulsion modI throughout  session. Remained seated in w/c at end of session, all needs in reach.   Tx 2: Pt missed 30 min PT d/t off unit for CT. Therapist went by pt's room later and met briefly with pt and caregiver regarding any concerns; expressed none at this time. Encouraged pt and caregiver to alert therapist tomorrow before d/c if any additional questions/concerns.   See Function Navigator for Current Functional Status.  Benjiman Core Tygielski 07/09/2017, 6:52 AM

## 2017-07-09 NOTE — Progress Notes (Signed)
[place] PHYSICAL MEDICINE & REHABILITATION     PROGRESS NOTE  Subjective/Complaints:  Patient seen lying in bed this morning.  He states he slept well overnight.  He has questions regarding transfer to [facility].  Discussed with nursing JP drain and leaking during flushes.  ROS: Denies CP, SOB, nausea, vomiting, diarrhea.  Objective: Vital Signs: Blood pressure (!) 151/79, pulse 79, temperature 98.4 F (36.9 C), temperature source Oral, resp. rate 18, height 6' (1.829 m), weight 88.5 kg (195 lb), SpO2 96 %. No results found. No results for input(s): WBC, HGB, HCT, PLT in the last 72 hours. No results for input(s): NA, K, CL, GLUCOSE, BUN, CREATININE, CALCIUM in the last 72 hours.  Invalid input(s): CO CBG (last 3)  Recent Labs    07/08/17 1628 07/08/17 2119 07/09/17 0628  GLUCAP 99 153* 127*    Wt Readings from Last 3 Encounters:  06/27/17 88.5 kg (195 lb)  06/26/17 92.1 kg (203 lb)  02/19/17 95.2 kg (209 lb 12.8 oz)    Physical Exam:  BP (!) 151/79 (BP Location: Right Arm)   Pulse 79   Temp 98.4 F (36.9 C) (Oral)   Resp 18   Ht 6' (1.829 m)   Wt 88.5 kg (195 lb)   SpO2 96%   BMI 26.45 kg/m  Constitutional: He appears well-developed and well-nourished. NAD. HENT: Normocephalic and atraumatic.  Eyes: EOM are normal. No discharge.  Cardiovascular: RRR. No JVD. Respiratory: Effort normal and breath sounds normal.  GI: Bowel sounds are normal. Nondistended. Musculoskeletal: Ring fixator in place on right ankle. LUE with long arm splint.  Neurological: He is alert and oriented.  Motor: Right upper extremity: 4/5 proximal distal Left upper extremity: Shoulder abduction 4/5 elbow and wrist braced, hand grip 3/5 (stable) Left lower extremity: Hip flexion, knee extension, ankle dorsiflexion 4--4/5 Right lower extremity: Hip flexion, knee extension 3/5, wiggles toes (unchanged) Skin:  Incision right thigh and right flank C/D/I Right elbow abrasions with dressing C/D/I.   Abdominal wound with dressing C/D/I Psychiatric: He has a normal mood and affect. His behavior is normal. Thought content normal.    Assessment/Plan: 1. Functional deficits secondary to Polytrauma which require 3+ hours per day of interdisciplinary therapy in a comprehensive inpatient rehab setting. Physiatrist is providing close team supervision and 24 hour management of active medical problems listed below. Physiatrist and rehab team continue to assess barriers to discharge/monitor patient progress toward functional and medical goals.  Function:  Bathing Bathing position   Position: Bed  Bathing parts Body parts bathed by patient: Right arm, Left arm, Chest, Abdomen, Front perineal area, Right upper leg, Left upper leg, Left lower leg Body parts bathed by helper: Back, Buttocks, Right lower leg  Bathing assist Assist Level: Touching or steadying assistance(Pt > 75%)      Upper Body Dressing/Undressing Upper body dressing   What is the patient wearing?: Pull over shirt/dress, Orthosis     Pull over shirt/dress - Perfomed by patient: Thread/unthread right sleeve, Thread/unthread left sleeve, Put head through opening Pull over shirt/dress - Perfomed by helper: Pull shirt over trunk     Orthosis activity level: Performed by helper  Upper body assist Assist Level: Supervision or verbal cues      Lower Body Dressing/Undressing Lower body dressing Lower body dressing/undressing activity did not occur: N/A What is the patient wearing?: Shoes     Pants- Performed by patient: Thread/unthread left pants leg Pants- Performed by helper: Pull pants up/down, Thread/unthread right pants leg Non-skid  slipper socks- Performed by patient: Don/doff right sock Non-skid slipper socks- Performed by helper: Don/doff left sock(left sock NA)     Shoes - Performed by patient: Don/doff left shoe, Fasten left Shoes - Performed by helper: Don/doff left shoe, Fasten left          Lower body  assist Assist for lower body dressing: Assistive device Assistive Device Comment: shoe horn, eastic shoe laces    Toileting Toileting Toileting activity did not occur: Safety/medical concerns   Toileting steps completed by helper: Adjust clothing prior to toileting, Performs perineal hygiene, Adjust clothing after toileting    Toileting assist Assist level: Two helpers   Transfers Chair/bed transfer   Chair/bed transfer method: Lateral scoot Chair/bed transfer assist level: Supervision or verbal cues Chair/bed transfer assistive device: Armrests, Sliding board     Locomotion Ambulation Ambulation activity did not occur: Safety/medical concerns(WB precautions)         Wheelchair   Type: Manual Max wheelchair distance: 150 Assist Level: Supervision or verbal cues  Cognition Comprehension Comprehension assist level: Understands complex 90% of the time/cues 10% of the time  Expression Expression assist level: Expresses complex 90% of the time/cues < 10% of the time  Social Interaction Social Interaction assist level: Interacts appropriately 90% of the time - Needs monitoring or encouragement for participation or interaction.  Problem Solving Problem solving assist level: Solves complex problems: With extra time  Memory Memory assist level: More than reasonable amount of time    Medical Problem List and Plan: 1. Deficits with mobility, transfers, self-care secondary to polytrauma.   Continue CIR  Will order study to evaluate JP drain placement 2. DVT Prophylaxis/Anticoagulation: Pharmaceutical: Other (comment)--Eliquis 3. Pain Management: tylenol and/or oxycodone prn effective.  4. Mood: LCSW to follow for evaluation and support.    Requires positive reinforcement and reassurance 5. Neuropsych: This patient is capable of making decisions on his own behalf. 6. Skin/Wound Care: Daily drain care with sterile flushes--to stay in place till follow up at [facility]  See notes for  wound  7. Fluids/Electrolytes/Nutrition: Monitor I/O.  8. Klebsiella UTI:   Changed over to po antibiotics on 5/22, omnicef completed on 5/26.  9. T2DM: Continue to monitor BS ac/hs and use SSI for tighter control.   Metformin increased to twice a day on 5/29    CBG (last 3)  Recent Labs    07/08/17 1628 07/08/17 2119 07/09/17 0628  GLUCAP 99 153* 127*   Slightly labile on 6/3 10. H/o depression: Managed with prozac. Has good outlookat present. 11. ABLA:    Hemoglobin 10.1 on 5/27  Labs pending  Continue to monitor 12. AKI: Resolved   Encourage fluid intake.  13. Hypomagnesemia: added mag ox.    Magnesium 1.7 on 5/23  Labs pending   Continue to monitor 14. Sleep disturbance  Cont Melatonin 15. Hypertension  Lisinopril 5 mg daily, Changed to 2.5 mg twice a day on 5/30   Vitals:   07/08/17 1951 07/09/17 0448  BP: (!) 145/71 (!) 151/79  Pulse: 87 79  Resp: 18 18  Temp: 97.8 F (36.6 C) 98.4 F (36.9 C)  SpO2: 94% 96%   Slightly labile on 6/3 16. Hypoalbuminemia  Supplement initiated on 5/23 17. Constipation  Continue bowel meds, increased on 5/31 18.  Urinary retention with UTI- had ant sacral hematoma  ?Sacral plexus injury, Omnicef completed  Flomax increased on 5/29  Added Urecholine, will consider further increase tomorrow  ?Improving 20. Dry eyes  Artificial tears ordered  on 5/30  LOS (Days) 12 A FACE TO FACE EVALUATION WAS PERFORMED  Tarren Sabree Lorie Phenix 07/09/2017 8:01 AM

## 2017-07-09 NOTE — Progress Notes (Signed)
RN dicussed bladder management with Algis Liming, PA upon discharge planning. Per Algis Liming, PA, wife has hired and gone thorough a home health nursing care agency that will provide 24 hours nursing care. I&O catheters will be managed by home health nursing agency with education provided to caregivers via agency. Per Algis Liming, PA a in depth conversation has been provided to wife regarding proper nursing care needs and management.
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
## 2017-07-09 NOTE — Progress Notes (Signed)
Occupational Therapy Discharge Summary  Patient Details  Name: Melvin [name] MRN: [id number] Date of Birth: [birth year]  Today's Date: 07/09/2017 OT Individual Time: 1449-1530 OT Individual Time Calculation (min): 41 min    Session Note:  Pt transitioned to the EOB where he worked on donning his gripper sock and his shoe with use of the AE.  He was able to complete with supervision and increased time.  Once completed, both he and the caregiver [facility] practiced drop arm commode transfers from bed to drop arm commode and back.  He was able to complete with min assist.  Clothing management was also completed with lateral leans side to side with mod assist and mod demonstrational cueing for technique in order to maintain weightbearing restrictions.  He was able to then transfer to the wheelchair with min assist and therapist helping.  Finished session with pt completing wheelchair mobility using his LLE and the RUE with supervision to and from the [place] elevators.  Pt left with caregiver sitting up in the wheelchair with call button in reach.    Patient has met 7 of 7 long term goals due to improved activity tolerance, improved balance and ability to compensate for deficits.  Patient to discharge at overall Mod Assist level.  Patient's care partner is independent to provide the necessary physical assistance at discharge.    Reasons goals not met: NA  Recommendation:  Patient will benefit from ongoing skilled OT services in home health setting to continue to advance functional skills in the area of BADL and Reduce care partner burden.  Equipment: wheelchair, hospital bed, drop arm commode, hoyer lift, and sliding board  Reasons for discharge: treatment goals met and discharge from hospital  Patient/family agrees with progress made and goals achieved: Yes  OT Discharge Precautions/Restrictions  Precautions Precautions: Fall;Other (comment) Precaution Comments: NWB LUE; NWB RLE; TDWB LLE for  transfers only Required Braces or Orthoses: Other Brace/Splint Other Brace/Splint: external fixator RLE, LSO applied in supine Restrictions Weight Bearing Restrictions: Yes LUE Weight Bearing: Non weight bearing RLE Weight Bearing: Non weight bearing LLE Weight Bearing: Touchdown weight bearing Vital Signs Therapy Vitals Temp: 98.7 F (37.1 C) Temp Source: Oral Pulse Rate: 96 Resp: 16 BP: 125/76 Patient Position (if appropriate): Sitting Oxygen Therapy SpO2: 96 % O2 Device: Room Air Pain Pain Assessment Pain Score: 4  ADL  See Function Section of chart for details  Vision Baseline Vision/History: Wears glasses Wears Glasses: Reading only Patient Visual Report: Blurring of vision(Pt reports his vision is more blurry secondary to not getting his eye drops as he takes them at home.) Perception  Perception: Within Functional Limits Praxis Praxis: Intact Cognition Overall Cognitive Status: Within Functional Limits for tasks assessed Arousal/Alertness: Awake/alert Orientation Level: Oriented X4 Attention: Sustained Sustained Attention: Appears intact Memory: Appears intact Awareness: Appears intact Problem Solving: Appears intact Safety/Judgment: Appears intact Sensation Sensation Light Touch: Impaired Detail Light Touch Impaired Details: Impaired LUE Stereognosis: Impaired Detail Stereognosis Impaired Details: Impaired LUE Additional Comments: Pt reports a difference in light touch with the left hand compared to the right.  Still in splint from the left forearm  proximal to the MPs of the digits and IP of the thumb.   Coordination Gross Motor Movements are Fluid and Coordinated: No Fine Motor Movements are Fluid and Coordinated: No Coordination and Movement Description: Pt with splint on the LUE from forearm down to MPs of digits and thumb which limts FM coordination in the left hand.   Motor  Motor Motor:  Within Functional Limits Motor - Skilled Clinical  Observations: generalized weakness and limitations secondary to pain in the back and RLE. Mobility  Bed Mobility Bed Mobility: Rolling Right;Rolling Left;Sit to Supine;Supine to Sit Rolling Right: 5: Supervision;With rail Rolling Left: 5: Supervision;With rail Supine to Sit: 5: Supervision;With rails Sit to Supine: 5: Supervision;With rail;HOB flat  Trunk/Postural Assessment  Cervical Assessment Cervical Assessment: Within Functional Limits Thoracic Assessment Thoracic Assessment: Within Functional Limits Lumbar Assessment Lumbar Assessment: Within Functional Limits Postural Control Postural Control: Within Functional Limits  Balance Balance Balance Assessed: Yes Static Sitting Balance Static Sitting - Balance Support: No upper extremity supported Static Sitting - Level of Assistance: 6: Modified independent (Device/Increase time) Dynamic Sitting Balance Dynamic Sitting - Balance Support: During functional activity Dynamic Sitting - Level of Assistance: 6: Modified independent (Device/Increase time) Extremity/Trunk Assessment RUE Assessment RUE Assessment: Within Functional Limits LUE Assessment LUE Assessment: Exceptions to [facility] LUE Strength LUE Overall Strength Comments: Pt with AROM elbow flexion WFLS with shoulder flexion 0-130 degrees grossly.  Noted splint on the left forearm all the way down to the MPs of the digits and thumb.  Limited AROM of FM and digit movements secondary to this but can use the LUE to assist with washing and dressing tasks.     See Function Navigator for Current Functional Status.  Elim Economou OTR/L 07/09/2017, 4:58 PM

## 2017-07-09 NOTE — Progress Notes (Signed)
Social Work Patient ID: Melvin [name], male   DOB: 1950/12/30, 67 y.o.   MRN: [id number]  Met with pt, caregiver and wife regarding plans for tomorrow. Pt has decide he wants to go home via ambulance, which caregiver has arranged. Equipment is being delivered today to the apartment. Now will need to hire CNA-2 to be able to provide the I & O cathing. Gave them two agencies which provide this service, [facility] and [facility]. If this can not be arranged then other option is having a foley placed. Continue to work on plans for tomorrow.

## 2017-07-10 DIAGNOSIS — T8189XA Other complications of procedures, not elsewhere classified, initial encounter: Secondary | ICD-10-CM

## 2017-07-10 LAB — GLUCOSE, CAPILLARY: GLUCOSE-CAPILLARY: 119 mg/dL — AB (ref 65–99)

## 2017-07-10 MED ORDER — FINASTERIDE 5 MG PO TABS: 5.0000 mg | ORAL_TABLET | Freq: Every day | ORAL | 0 refills | Status: DC

## 2017-07-10 MED ORDER — POLYETHYLENE GLYCOL 3350 17 G PO PACK: 17.0000 g | PACK | Freq: Two times a day (BID) | ORAL | 0 refills | Status: DC

## 2017-07-10 MED ORDER — TAMSULOSIN HCL 0.4 MG PO CAPS: 0.8000 mg | ORAL_CAPSULE | Freq: Every day | ORAL | 0 refills | Status: DC

## 2017-07-10 MED ORDER — METHOCARBAMOL 500 MG PO TABS: 500.0000 mg | ORAL_TABLET | Freq: Four times a day (QID) | ORAL | 0 refills | Status: DC | PRN

## 2017-07-10 MED ORDER — ACETAMINOPHEN 325 MG PO TABS: 650.0000 mg | ORAL_TABLET | Freq: Three times a day (TID) | ORAL | Status: DC

## 2017-07-10 MED ORDER — MELATONIN 3 MG PO TABS: 3.0000 mg | ORAL_TABLET | Freq: Every day | ORAL | 0 refills | Status: AC

## 2017-07-10 MED ORDER — ASPIRIN 81 MG PO TBEC: 81.0000 mg | DELAYED_RELEASE_TABLET | Freq: Every day | ORAL | Status: AC

## 2017-07-10 MED ORDER — LISINOPRIL 2.5 MG PO TABS: 2.5000 mg | ORAL_TABLET | Freq: Two times a day (BID) | ORAL | 0 refills | Status: DC

## 2017-07-10 MED ORDER — GABAPENTIN 100 MG PO CAPS: 100.0000 mg | ORAL_CAPSULE | Freq: Two times a day (BID) | ORAL | 0 refills | Status: DC

## 2017-07-10 MED ORDER — FLUOXETINE HCL 20 MG PO CAPS
60.0000 mg | ORAL_CAPSULE | Freq: Every day | ORAL | 0 refills | Status: DC
Start: 2017-07-10 — End: 2017-08-20

## 2017-07-10 MED ORDER — METFORMIN HCL ER 750 MG PO TB24: 750.0000 mg | ORAL_TABLET | Freq: Every day | ORAL | 0 refills | Status: DC

## 2017-07-10 MED ORDER — LIDOCAINE HCL URETHRAL/MUCOSAL 2 % EX GEL: CUTANEOUS | 0 refills | Status: DC | PRN

## 2017-07-10 MED ORDER — MAGNESIUM OXIDE 400 (241.3 MG) MG PO TABS: 200.0000 mg | ORAL_TABLET | Freq: Two times a day (BID) | ORAL | 0 refills | Status: DC

## 2017-07-10 MED ORDER — KETOTIFEN FUMARATE 0.025 % OP SOLN: 1.0000 [drp] | Freq: Two times a day (BID) | OPHTHALMIC | 0 refills | Status: DC

## 2017-07-10 MED ORDER — PREMIER PROTEIN SHAKE: 11.0000 [oz_av] | Freq: Three times a day (TID) | ORAL | 0 refills | Status: AC

## 2017-07-10 MED ORDER — BETHANECHOL CHLORIDE 10 MG PO TABS: 10.0000 mg | ORAL_TABLET | Freq: Three times a day (TID) | ORAL | 0 refills | Status: DC

## 2017-07-10 MED ORDER — APIXABAN 5 MG PO TABS: 5.0000 mg | ORAL_TABLET | Freq: Two times a day (BID) | ORAL | 0 refills | Status: DC

## 2017-07-10 MED ORDER — SENNOSIDES-DOCUSATE SODIUM 8.6-50 MG PO TABS: 2.0000 | ORAL_TABLET | Freq: Two times a day (BID) | ORAL | 0 refills | Status: DC

## 2017-07-10 MED ORDER — LIDOCAINE 5 % EX PTCH: MEDICATED_PATCH | CUTANEOUS | 0 refills | Status: DC

## 2017-07-10 MED ORDER — AMLODIPINE BESYLATE 5 MG PO TABS: 5.0000 mg | ORAL_TABLET | Freq: Every day | ORAL | 0 refills | Status: DC

## 2017-07-10 MED ORDER — OXYCODONE HCL 5 MG PO TABS: 5.0000 mg | ORAL_TABLET | Freq: Three times a day (TID) | ORAL | 0 refills | Status: DC | PRN

## 2017-07-10 MED ORDER — ADULT MULTIVITAMIN W/MINERALS CH: 1.0000 | ORAL_TABLET | Freq: Every day | ORAL | Status: AC

## 2017-07-10 MED ORDER — EZETIMIBE 10 MG PO TABS: 10.0000 mg | ORAL_TABLET | Freq: Every day | ORAL | 0 refills | Status: DC

## 2017-07-10 NOTE — Progress Notes (Addendum)
[place] PHYSICAL MEDICINE & REHABILITATION     PROGRESS NOTE  Subjective/Complaints:  Patient seen lying in bed this morning. He states he slept well overnight. He states he is ready for discharge. Yesterday he was evaluated by interventional radiology regarding drain placement and CT abdomen/pelvis was ordered.  ROS: denies CP, SOB, nausea, vomiting, diarrhea.  Objective: Vital Signs: Blood pressure (!) 151/78, pulse 74, temperature 98.6 F (37 C), temperature source Oral, resp. rate 18, height 6' (1.829 m), weight 88.5 kg (195 lb), SpO2 94 %. Ct Abdomen Pelvis W Contrast  Result Date: 07/10/2017 CLINICAL DATA:  Status post percutaneous drainage of hematoma. EXAM: CT ABDOMEN AND PELVIS WITH CONTRAST TECHNIQUE: Multidetector CT imaging of the abdomen and pelvis was performed using the standard protocol following bolus administration of intravenous contrast. CONTRAST:  17mL OMNIPAQUE IOHEXOL 300 MG/ML  SOLN COMPARISON:  None available currently. FINDINGS: Lower chest: No acute abnormality. Hepatobiliary: No focal liver abnormality is seen. No gallstones, gallbladder wall thickening, or biliary dilatation. Pancreas: Unremarkable. No pancreatic ductal dilatation or surrounding inflammatory changes. Spleen: Normal in size without focal abnormality. Adrenals/Urinary Tract: Adrenal glands appear normal. Bilateral renal cysts are noted. Mildly complex cyst measuring 4.7 cm is seen arising from lower pole of left kidney with peripheral calcifications and single septation. No hydronephrosis or renal obstruction is noted. Small nonobstructive left renal calculus is noted. No definite ureteral calculi are noted. Urinary bladder is unremarkable. Stomach/Bowel: The stomach appears normal. Stool is noted throughout the colon. There is no evidence of bowel obstruction or ileus. Status post appendectomy. Vascular/Lymphatic: Aortic atherosclerosis. No enlarged abdominal or pelvic lymph nodes. Reproductive: Not well  visualized due to scatter artifact arising from bilateral hip prostheses. Other: Distal tip of drainage catheter is seen anterior to the left iliac spine near left inguinal region. 7.5 x 2.1 cm adjacent fluid collection is noted. 7.0 x 2.4 cm fluid collection is seen in subcutaneous tissues of right lower quadrant most consistent with postoperative seroma. Musculoskeletal: Postsurgical and degenerative changes are noted in the lower lumbar spine. Status post bilateral hip arthroplasties. No acute abnormality is noted. IMPRESSION: Distal tip of surgical drain is seen in subcutaneous tissues anterior to the left iliac bone in the left lower quadrant of the abdomen. There is adjacent 7.5 x 2.1 cm fluid collection. 7.0 x 2.4 cm subcutaneous fluid collection is seen in right lower quadrant of abdomen most consistent with postoperative seroma. 5.4 cm septated cyst with peripheral calcifications is noted arising from lower pole of left kidney consistent with Bosniak type 2 renal cystic lesion. Follow-up ultrasound in 1 year is recommended to ensure stability. Aortic Atherosclerosis (ICD10-I70.0). Electronically Signed   By: Marijo Conception, M.D.   On: 07/10/2017 07:22   Recent Labs    07/09/17 0716  WBC 4.9  HGB 10.9*  HCT 34.2*  PLT 173   Recent Labs    07/09/17 0716  NA 141  K 3.8  CL 106  GLUCOSE 129*  BUN 13  CREATININE 0.44*  CALCIUM 8.7*   CBG (last 3)  Recent Labs    07/09/17 1657 07/09/17 2217 07/10/17 0657  GLUCAP 130* 117* 119*    Wt Readings from Last 3 Encounters:  06/27/17 88.5 kg (195 lb)  06/26/17 92.1 kg (203 lb)  02/19/17 95.2 kg (209 lb 12.8 oz)    Physical Exam:  BP (!) 151/78 (BP Location: Right Arm)   Pulse 74   Temp 98.6 F (37 C) (Oral)   Resp 18  Ht 6' (1.829 m)   Wt 88.5 kg (195 lb)   SpO2 94%   BMI 26.45 kg/m  Constitutional: He appears well-developed and well-nourished. NAD. HENT: Normocephalic and atraumatic.  Eyes: EOM are normal. No discharge.   Cardiovascular: RRR. No JVD. Respiratory: Effort normal and breath sounds normal.  GI: Bowel sounds are normal. Nondistended. Musculoskeletal: Ring fixator in place on right ankle. LUE with long arm splint.  Neurological: He is alert and oriented.  Motor: Right upper extremity: 4/5 proximal distal Left upper extremity: Shoulder abduction 4/5 elbow and wrist braced, hand grip 3/5 (stable) Left lower extremity: Hip flexion, knee extension, ankle dorsiflexion 4--4/5 Right lower extremity: Hip flexion, knee extension 3/5, wiggles toes (stable) Skin:  Incision right thigh and right flank C/D/I Right elbow abrasions with dressing C/D/I.  Abdominal wound with dressing C/D/I Induration around JP drain site Psychiatric: He has a normal mood and affect. His behavior is normal. Thought content normal.    Assessment/Plan: 1. Functional deficits secondary to Polytrauma which require 3+ hours per day of interdisciplinary therapy in a comprehensive inpatient rehab setting. Physiatrist is providing close team supervision and 24 hour management of active medical problems listed below. Physiatrist and rehab team continue to assess barriers to discharge/monitor patient progress toward functional and medical goals.  Function:  Bathing Bathing position   Position: Bed  Bathing parts Body parts bathed by patient: Right arm, Left arm, Chest, Abdomen, Front perineal area, Right upper leg, Left upper leg, Left lower leg Body parts bathed by helper: Buttocks, Back  Bathing assist Assist Level: Touching or steadying assistance(Pt > 75%)      Upper Body Dressing/Undressing Upper body dressing   What is the patient wearing?: Pull over shirt/dress     Pull over shirt/dress - Perfomed by patient: Thread/unthread right sleeve, Thread/unthread left sleeve, Put head through opening, Pull shirt over trunk Pull over shirt/dress - Perfomed by helper: Pull shirt over trunk     Orthosis activity level:  Performed by helper  Upper body assist Assist Level: Supervision or verbal cues      Lower Body Dressing/Undressing Lower body dressing Lower body dressing/undressing activity did not occur: N/A What is the patient wearing?: Pants, Non-skid slipper socks     Pants- Performed by patient: Thread/unthread left pants leg Pants- Performed by helper: Pull pants up/down, Thread/unthread right pants leg Non-skid slipper socks- Performed by patient: Don/doff left sock Non-skid slipper socks- Performed by helper: (right sock NA)     Shoes - Performed by patient: Don/doff left shoe, Fasten left Shoes - Performed by helper: Don/doff left shoe, Fasten left          Lower body assist Assist for lower body dressing: Assistive device Assistive Device Comment: shoe horn, eastic shoe laces    Toileting Toileting Toileting activity did not occur: Safety/medical concerns   Toileting steps completed by helper: Adjust clothing prior to toileting, Adjust clothing after toileting    Toileting assist Assist level: Two helpers   Transfers Chair/bed transfer   Chair/bed transfer method: Lateral scoot Chair/bed transfer assist level: Supervision or verbal cues Chair/bed transfer assistive device: Armrests, Sliding board     Locomotion Ambulation Ambulation activity did not occur: Safety/medical concerns         Wheelchair   Type: Manual Max wheelchair distance: 150 Assist Level: No help, No cues, assistive device, takes more than reasonable amount of time  Cognition Comprehension Comprehension assist level: Understands complex 90% of the time/cues 10% of the time  Expression  Expression assist level: Expresses complex 90% of the time/cues < 10% of the time  Social Interaction Social Interaction assist level: Interacts appropriately with others with medication or extra time (anti-anxiety, antidepressant).  Problem Solving Problem solving assist level: Solves basic 90% of the time/requires  cueing < 10% of the time  Memory Memory assist level: Recognizes or recalls 90% of the time/requires cueing < 10% of the time    Medical Problem List and Plan: 1. Deficits with mobility, transfers, self-care secondary to polytrauma.  D/c today  Will see patient for transitional care management in 1-2 weeks 2. DVT Prophylaxis/Anticoagulation: Pharmaceutical: Other (comment)--Eliquis 3. Pain Management: tylenol and/or oxycodone prn effective.  4. Mood: LCSW to follow for evaluation and support.    Requires positive reinforcement and reassurance 5. Neuropsych: This patient is capable of making decisions on his own behalf. 6. Skin/Wound Care: Daily drain care with sterile flushes--to stay in place till [facility]  See notes for wound   VIR consultation, appreciate recs. CT abdomen and pelvis performed, reviewed, showing bilateral fluid collections. Await further recommendations by VIR. Patient follow-up with [facility] 1 day post discharge. 7. Fluids/Electrolytes/Nutrition: Monitor I/O.  8. Klebsiella UTI:   Changed over to po antibiotics on 5/22, omnicef completed on 5/26.  9. T2DM: Continue to monitor BS ac/hs and use SSI for tighter control.   Metformin increased to twice a day on 5/29    CBG (last 3)  Recent Labs    07/09/17 1657 07/09/17 2217 07/10/17 0657  GLUCAP 130* 117* 119*   Relatively controlled on 6/4 10. H/o depression: Managed with prozac. Has good outlookat present. 11. ABLA:    Hemoglobin 10.9 on 6/3  Continue to monitor 12. AKI: Resolved   Encourage fluid intake.  13. Hypomagnesemia: added mag ox.    Magnesium 2.0 on 6/63   Continue to monitor 14. Sleep disturbance  Cont Melatonin 15. Hypertension  Lisinopril 5 mg daily, Changed to 2.5 mg twice a day on 5/30   Vitals:   07/09/17 2213 07/10/17 0623  BP: (!) 145/91 (!) 151/78  Pulse: 80 74  Resp: 15 18  Temp: 97.9 F (36.6 C) 98.6 F (37 C)  SpO2: 97% 94%   Remain slightly labile, will need  outpatient follow-up 16. Hypoalbuminemia  Supplement initiated on 5/23 17. Constipation  Continue bowel meds, increased on 5/31 18.  Urinary retention with UTI- had ant sacral hematoma  ?Sacral plexus injury, Omnicef completed  Flomax increased on 5/29  Added Urecholine  ?Improving 20. Dry eyes  Artificial tears ordered on 5/30  > 30 minutes spent in total in counseling and coordination of care regarding wounds, JP drain, fluid collection, urinary retention, etc.  LOS (Days) 13 A FACE TO FACE EVALUATION WAS PERFORMED  Takera Rayl Lorie Phenix 07/10/2017 8:14 AM

## 2017-07-10 NOTE — Progress Notes (Signed)
RN provided pt and caregiver with eight I&O cath kits today and there were 3 given to pt yesterday from staff.

## 2017-07-10 NOTE — Progress Notes (Addendum)
Social Work  Discharge Note  The overall goal for the admission was met for:   Discharge location: Yes-[place]-HIRED CAREGIVER  Length of Stay: Yes-13 DAYS  Discharge activity level: Yes-MIN WHEELCHAIR LEVEL  Home/community participation: Yes  Services provided included: MD, RD, PT, OT, RN, CM, Pharmacy, Neuropsych and SW  Financial Services: Private Insurance: BCBS  Follow-up services arranged: Home Health: [facility]-PT,OT,RN, DME: [facility]-HOSPITAL BED, AIR OVERLAY, WHEELCHAIR, WIDE DROP-ARM BEDSIDE COMMODE, 30 TRANSFER BOARD, HOYER LIFT, and Patient/Family has no preference for HH/DME agencies  Comments (or additional information):PT HAS HIRED A CAREGIVER TO PROVIDE HIS CARE AND TO DO HIS I & O CATH'S, ALSO MOVED INTO [place] APARTMENT UNTIL IS MORE MOBILE AND DOESN'T REQUIRE SO MUCH EQUIPMENT. GIVEN PRIVATE TRANSPORTATION SERVICES FOR VAN AND NON-EMERGENCY AMBULANCE FOR GOING BACK AND FORTH TO [facility] FOR APPOINTMENTS. HAVE CONTACTED [facility] TO MAKE SURE SEEN TOMORROW. SHE WILL DO THIS  Patient/Family verbalized understanding of follow-up arrangements: Yes  Individual responsible for coordination of the follow-up plan: SELF , KATHY-WIFE AND MILAK-HIRED CAREGIVER  Confirmed correct DME delivered: [place], Rebecca G 07/10/2017    Dupree, Rebecca G 

## 2017-07-10 NOTE — Progress Notes (Signed)
Surtures removed to Right ankle/foot per order, Algis Liming, PA and drain flushed with 10cc water with 27ml bloody output from drain. Drainage site cleansed with NS and bloody drainage around site. Algis Liming, PA aware of finding. Foam placed to Right ankle. Denies pain at this time.

## 2017-07-10 NOTE — Progress Notes (Addendum)
Patient ID: Melvin [name], male   DOB: [birth year], 67 y.o.   MRN: [id number]   CT of Abd/pelvis has been reviewed with Dr Jearl Klinefelter is in good position within hematoma/abscess Pt is to follow up with [place] Surgeon in 2 days  Please let surgeon determine next step for this pt and drain No further recommendation from IR  Called to Algis Liming [facility] Left message

## 2017-07-11 ENCOUNTER — Telehealth: Payer: Self-pay

## 2017-07-11 NOTE — Telephone Encounter (Signed)
Called and left message on Temporary phone number and cell phone could not leave message because mailbox full.

## 2017-07-12 NOTE — Telephone Encounter (Signed)
Called pt and left on home/temporary phone and called cell phone but mailbox was full.

## 2017-07-12 NOTE — Telephone Encounter (Signed)
Placed a call to Ms. Melvin [name] ( wife), she states the transportation cost is $750.00 dollars. [name] appointment has been changed to HFU due to the above. [name] verbalizes understanding. [facility] came out to home for evaluation on 07/11/2017, per wife. [name] appointment has been changed to 08/16/2017 at 11:20, she was instructed to arrive at 11:00. She verbalizes understanding.

## 2017-07-13 ENCOUNTER — Telehealth: Payer: Self-pay | Admitting: *Deleted

## 2017-07-13 DIAGNOSIS — S5292XE Unspecified fracture of left forearm, subsequent encounter for open fracture type I or II with routine healing: Secondary | ICD-10-CM | POA: Insufficient documentation

## 2017-07-13 NOTE — Telephone Encounter (Signed)
[employer] [place] called about needing to know if the lidocaine patches are for post herpetic neuralgia.  They are faxing the forms.

## 2017-07-19 ENCOUNTER — Ambulatory Visit (INDEPENDENT_AMBULATORY_CARE_PROVIDER_SITE_OTHER): Payer: BLUE CROSS/BLUE SHIELD

## 2017-07-19 ENCOUNTER — Encounter: Payer: BLUE CROSS/BLUE SHIELD | Admitting: Physical Medicine & Rehabilitation

## 2017-07-19 DIAGNOSIS — S322XXD Fracture of coccyx, subsequent encounter for fracture with routine healing: Secondary | ICD-10-CM | POA: Diagnosis not present

## 2017-07-19 DIAGNOSIS — F329 Major depressive disorder, single episode, unspecified: Secondary | ICD-10-CM | POA: Diagnosis not present

## 2017-07-19 DIAGNOSIS — E119 Type 2 diabetes mellitus without complications: Secondary | ICD-10-CM

## 2017-07-19 DIAGNOSIS — N400 Enlarged prostate without lower urinary tract symptoms: Secondary | ICD-10-CM

## 2017-07-19 DIAGNOSIS — F17290 Nicotine dependence, other tobacco product, uncomplicated: Secondary | ICD-10-CM

## 2017-07-19 DIAGNOSIS — S36892D Contusion of other intra-abdominal organs, subsequent encounter: Secondary | ICD-10-CM | POA: Diagnosis not present

## 2017-07-19 DIAGNOSIS — R41843 Psychomotor deficit: Secondary | ICD-10-CM | POA: Diagnosis not present

## 2017-07-19 DIAGNOSIS — S3219XD Other fracture of sacrum, subsequent encounter for fracture with routine healing: Secondary | ICD-10-CM

## 2017-07-19 DIAGNOSIS — S32059D Unspecified fracture of fifth lumbar vertebra, subsequent encounter for fracture with routine healing: Secondary | ICD-10-CM

## 2017-07-19 DIAGNOSIS — S52692D Other fracture of lower end of left ulna, subsequent encounter for closed fracture with routine healing: Secondary | ICD-10-CM | POA: Diagnosis not present

## 2017-07-19 DIAGNOSIS — S92009D Unspecified fracture of unspecified calcaneus, subsequent encounter for fracture with routine healing: Secondary | ICD-10-CM | POA: Diagnosis not present

## 2017-07-19 DIAGNOSIS — M519 Unspecified thoracic, thoracolumbar and lumbosacral intervertebral disc disorder: Secondary | ICD-10-CM

## 2017-07-19 DIAGNOSIS — K219 Gastro-esophageal reflux disease without esophagitis: Secondary | ICD-10-CM

## 2017-07-19 DIAGNOSIS — S32040D Wedge compression fracture of fourth lumbar vertebra, subsequent encounter for fracture with routine healing: Secondary | ICD-10-CM

## 2017-07-19 DIAGNOSIS — G4733 Obstructive sleep apnea (adult) (pediatric): Secondary | ICD-10-CM

## 2017-07-19 DIAGNOSIS — I10 Essential (primary) hypertension: Secondary | ICD-10-CM

## 2017-07-25 ENCOUNTER — Encounter: Payer: Self-pay | Admitting: Internal Medicine

## 2017-07-30 ENCOUNTER — Telehealth: Payer: Self-pay | Admitting: Internal Medicine

## 2017-07-30 NOTE — Telephone Encounter (Signed)
Noted. Will forward signed message to CY as FYI.

## 2017-07-30 NOTE — Telephone Encounter (Signed)
She states he is still using CPAP and it is working well for him.  No call back is needed, she just wanted [place] to be aware patient would not be able to come to office for CPAP follow up at this time.

## 2017-08-01 ENCOUNTER — Ambulatory Visit: Payer: BLUE CROSS/BLUE SHIELD | Admitting: Internal Medicine

## 2017-08-07 ENCOUNTER — Other Ambulatory Visit: Payer: Self-pay | Admitting: Family Medicine

## 2017-08-08 NOTE — Telephone Encounter (Signed)
Patient went to follow up at [facility] and was advised to ask PCP to refill these medications.  I did not see in chart that Dr. Laurance Flatten had prescribed these medications for patient in the past.  Ok to refill?

## 2017-08-16 ENCOUNTER — Encounter: Payer: BLUE CROSS/BLUE SHIELD | Admitting: Physical Medicine & Rehabilitation

## 2017-08-20 ENCOUNTER — Other Ambulatory Visit: Payer: Self-pay | Admitting: Family Medicine

## 2017-08-21 ENCOUNTER — Other Ambulatory Visit: Payer: Self-pay | Admitting: Family Medicine

## 2017-08-21 ENCOUNTER — Telehealth: Payer: Self-pay | Admitting: Family Medicine

## 2017-08-21 MED ORDER — VITAMIN D (ERGOCALCIFEROL) 1.25 MG (50000 UNIT) PO CAPS: 50000.0000 [IU] | ORAL_CAPSULE | ORAL | 3 refills | Status: DC

## 2017-08-21 NOTE — Telephone Encounter (Signed)
Needs to come in for appt and have vit d level repeated prior to refill.

## 2017-08-21 NOTE — Telephone Encounter (Signed)
Last seen 01/17/17  DWM  Last Vit D 02/04/16   51.1

## 2017-08-21 NOTE — Telephone Encounter (Signed)
Needs f/u for Prozac. Has completed high dose Vit D.  This has not been renewed.  Prozac sent to pharmacy.

## 2017-08-21 NOTE — Telephone Encounter (Signed)
done

## 2017-08-21 NOTE — Telephone Encounter (Signed)
Patient of DWM. Chart says he takes OTC. Hasn't had Vit D level since 02/04/16. Please review and advise on RF

## 2017-08-24 DIAGNOSIS — G4733 Obstructive sleep apnea (adult) (pediatric): Secondary | ICD-10-CM | POA: Insufficient documentation

## 2017-08-24 DIAGNOSIS — N400 Enlarged prostate without lower urinary tract symptoms: Secondary | ICD-10-CM | POA: Insufficient documentation

## 2017-08-24 DIAGNOSIS — N179 Acute kidney failure, unspecified: Secondary | ICD-10-CM

## 2017-08-24 DIAGNOSIS — E46 Unspecified protein-calorie malnutrition: Secondary | ICD-10-CM

## 2017-08-24 DIAGNOSIS — E8809 Other disorders of plasma-protein metabolism, not elsewhere classified: Secondary | ICD-10-CM

## 2017-08-27 ENCOUNTER — Other Ambulatory Visit: Payer: Self-pay | Admitting: Family Medicine

## 2017-08-28 NOTE — Telephone Encounter (Signed)
Last seen 01/17/17.

## 2017-09-06 ENCOUNTER — Encounter: Payer: BLUE CROSS/BLUE SHIELD | Admitting: Physical Medicine & Rehabilitation

## 2017-09-07 ENCOUNTER — Telehealth: Payer: Self-pay | Admitting: Family Medicine

## 2017-09-07 ENCOUNTER — Other Ambulatory Visit: Payer: Self-pay | Admitting: *Deleted

## 2017-09-07 DIAGNOSIS — R197 Diarrhea, unspecified: Secondary | ICD-10-CM

## 2017-09-07 NOTE — Telephone Encounter (Signed)
Spoke to [place] and she says DWM was supposed to call you and talk to you about doing something for pt. Have you heard from him?

## 2017-09-07 NOTE — Telephone Encounter (Signed)
Spoke with wife

## 2017-09-08 ENCOUNTER — Other Ambulatory Visit: Payer: Medicare Other

## 2017-09-08 DIAGNOSIS — R197 Diarrhea, unspecified: Secondary | ICD-10-CM

## 2017-09-14 LAB — OVA AND PARASITE EXAMINATION

## 2017-09-14 LAB — STOOL CULTURE: E coli, Shiga toxin Assay: NEGATIVE

## 2017-09-14 LAB — CLOSTRIDIUM DIFFICILE BY PCR: Toxigenic C. Difficile by PCR: NEGATIVE

## 2017-09-17 ENCOUNTER — Other Ambulatory Visit: Payer: Self-pay | Admitting: Family Medicine

## 2017-09-17 NOTE — Telephone Encounter (Signed)
Last seen 01/17/17.

## 2017-09-18 DIAGNOSIS — S32811A Multiple fractures of pelvis with unstable disruption of pelvic ring, initial encounter for closed fracture: Secondary | ICD-10-CM | POA: Insufficient documentation

## 2017-09-26 ENCOUNTER — Encounter: Payer: Self-pay | Admitting: Family Medicine

## 2017-10-18 ENCOUNTER — Other Ambulatory Visit: Payer: Self-pay | Admitting: Family Medicine

## 2017-10-18 NOTE — Telephone Encounter (Signed)
Last seen 01/17/17  DWM

## 2017-10-29 ENCOUNTER — Other Ambulatory Visit: Payer: Self-pay | Admitting: Family Medicine

## 2017-11-02 ENCOUNTER — Other Ambulatory Visit: Payer: Self-pay | Admitting: Family Medicine

## 2017-11-12 ENCOUNTER — Other Ambulatory Visit: Payer: Self-pay | Admitting: Family Medicine

## 2017-11-12 NOTE — Telephone Encounter (Signed)
Last seen 01/17/17.

## 2017-11-19 ENCOUNTER — Other Ambulatory Visit: Payer: Self-pay | Admitting: Family Medicine

## 2017-11-19 ENCOUNTER — Other Ambulatory Visit: Payer: Self-pay | Admitting: Family

## 2017-11-19 NOTE — Telephone Encounter (Signed)
Patient's wife notified refill was sent.  Asked her to schedule an appointment for follow up with Dr. Laurance Flatten before additional refills are needed.

## 2017-11-29 ENCOUNTER — Ambulatory Visit (INDEPENDENT_AMBULATORY_CARE_PROVIDER_SITE_OTHER): Payer: Medicare Other | Admitting: Family

## 2017-11-29 ENCOUNTER — Encounter: Payer: Self-pay | Admitting: Family

## 2017-11-29 VITALS — BP 130/68 | HR 80 | Temp 97.8°F

## 2017-11-29 DIAGNOSIS — J159 Unspecified bacterial pneumonia: Secondary | ICD-10-CM

## 2017-11-29 MED ORDER — AZITHROMYCIN 250 MG PO TABS: ORAL_TABLET | ORAL | 0 refills | Status: DC

## 2017-11-29 MED ORDER — PREDNISONE 10 MG (21) PO TBPK: ORAL_TABLET | ORAL | 0 refills | Status: DC

## 2017-11-29 NOTE — Progress Notes (Signed)
   Subjective:    Patient ID: Melvin Gaw., male    DOB: 06-18-1950, 67 y.o.   MRN: [id number]  Chief Complaint  Patient presents with  . Cough    Cough  This is a new problem. The current episode started in the past 7 days. The problem has been unchanged. The problem occurs every few minutes. The cough is productive of Benavides sputum. Associated symptoms include myalgias, nasal congestion, postnasal drip, rhinorrhea, a sore throat, shortness of breath and wheezing. Pertinent negatives include no chills, ear congestion, ear pain, fever or headaches. He has tried rest (amoxicillin) for the symptoms. The treatment provided mild relief. There is no history of asthma or COPD.      Review of Systems  Constitutional: Negative for chills and fever.  HENT: Positive for postnasal drip, rhinorrhea and sore throat. Negative for ear pain.   Respiratory: Positive for cough, shortness of breath and wheezing.   Musculoskeletal: Positive for arthralgias, back pain and myalgias.  Neurological: Negative for headaches.  All other systems reviewed and are negative.      Objective:   Physical Exam  Constitutional: He is oriented to person, place, and time. He appears well-developed and well-nourished. No distress.  HENT:  Head: Normocephalic.  Right Ear: External ear normal.  Left Ear: External ear normal.  Nose: Mucosal edema present.  Mouth/Throat: Posterior oropharyngeal erythema present.  Eyes: Pupils are equal, round, and reactive to light. Right eye exhibits no discharge. Left eye exhibits no discharge.  Neck: Normal range of motion. Neck supple. No thyromegaly present.  Cardiovascular: Normal rate, regular rhythm, normal heart sounds and intact distal pulses.  No murmur heard. Pulmonary/Chest: Effort normal. No respiratory distress. He has no wheezes. He has rales.  Abdominal: Soft. Bowel sounds are normal. He exhibits no distension. There is no tenderness.  Musculoskeletal: Normal range  of motion. He exhibits no edema or tenderness.  Neurological: He is alert and oriented to person, place, and time. He has normal reflexes. No cranial nerve deficit.  Skin: Skin is warm and dry. No rash noted. No erythema.  Psychiatric: He has a normal mood and affect. His behavior is normal. Judgment and thought content normal.  Vitals reviewed.     BP 130/68   Pulse 80   Temp 97.8 F (36.6 C) (Oral)      Assessment & Plan:  Melvin [name]. comes in today with chief complaint of Cough   Diagnosis and orders addressed:  1. Community acquired bacterial pneumonia - Take meds as prescribed - Use a cool mist humidifier  -Use saline nose sprays frequently -Force fluids -For any cough or congestion  Use plain Mucinex- regular strength or max strength is fine -For fever or aces or pains- take tylenol or ibuprofen. -Throat lozenges if help -RTO if symptoms worsen or do not improve - azithromycin (ZITHROMAX) 250 MG tablet; Take 500 mg once, then 250 mg for four days  Dispense: 6 tablet; Refill: 0 - predniSONE (STERAPRED UNI-PAK 21 TAB) 10 MG (21) TBPK tablet; Use as directed  Dispense: 21 tablet; Refill: 0   Evelina Dun, FNP

## 2017-11-29 NOTE — Patient Instructions (Signed)

## 2017-12-10 ENCOUNTER — Other Ambulatory Visit: Payer: Self-pay | Admitting: Family Medicine

## 2017-12-19 ENCOUNTER — Other Ambulatory Visit: Payer: Self-pay | Admitting: Family Medicine

## 2017-12-20 ENCOUNTER — Telehealth: Payer: Self-pay | Admitting: Family Medicine

## 2017-12-20 MED ORDER — FLUOXETINE HCL 20 MG PO CAPS
60.0000 mg | ORAL_CAPSULE | Freq: Every day | ORAL | 3 refills | Status: DC
Start: 2017-12-20 — End: 2018-03-11

## 2017-12-20 NOTE — Telephone Encounter (Signed)
Refill sent in

## 2017-12-20 NOTE — Telephone Encounter (Signed)
What is the name of the medication? FLUoxetine (PROZAC) 20 MG capsule  Have you contacted your pharmacy to request a refill? Yes but no more refills and pt is out. Pt was here 11/29/2017  Which pharmacy would you like this sent to? [facility]   Patient notified that their request is being sent to the clinical staff for review and that they should receive a call once it is complete. If they do not receive a call within 24 hours they can check with their pharmacy or our office.

## 2017-12-20 NOTE — Telephone Encounter (Signed)
DWM. NTBS 30 days given 11/19/17.

## 2017-12-20 NOTE — Addendum Note (Signed)
Addended by: Zannie Cove on: 12/20/2017 09:20 AM   Modules accepted: Orders

## 2018-01-02 ENCOUNTER — Telehealth: Payer: Self-pay | Admitting: *Deleted

## 2018-01-07 ENCOUNTER — Other Ambulatory Visit: Payer: Self-pay | Admitting: Physical Medicine and Rehabilitation

## 2018-01-12 ENCOUNTER — Other Ambulatory Visit: Payer: Self-pay | Admitting: Family Medicine

## 2018-01-16 ENCOUNTER — Telehealth: Payer: Self-pay | Admitting: Family Medicine

## 2018-01-16 NOTE — Telephone Encounter (Signed)
Needs letter for court and wife says she spoke with you and said you would write the letter. We can see his information from [facility] through [employer].  What would you like this letter to say?

## 2018-01-16 NOTE — Telephone Encounter (Signed)
I will try to dictate a note for this patient for his visit to his or year based on the information I received from his visits at [facility] and his current condition and the information from his wife.

## 2018-01-17 ENCOUNTER — Encounter: Payer: Self-pay | Admitting: *Deleted

## 2018-01-17 NOTE — Telephone Encounter (Signed)
See hand written letter to change to letter on our letterhead to be sent to patient's attorney

## 2018-01-17 NOTE — Telephone Encounter (Signed)
Reminder

## 2018-01-25 DIAGNOSIS — S52502M Unspecified fracture of the lower end of left radius, subsequent encounter for open fracture type I or II with nonunion: Secondary | ICD-10-CM | POA: Insufficient documentation

## 2018-02-02 ENCOUNTER — Other Ambulatory Visit: Payer: Self-pay | Admitting: Family Medicine

## 2018-02-07 NOTE — Telephone Encounter (Signed)
lmtcb for flu shot 

## 2018-02-19 ENCOUNTER — Ambulatory Visit: Payer: BLUE CROSS/BLUE SHIELD | Admitting: Internal Medicine

## 2018-02-28 ENCOUNTER — Other Ambulatory Visit: Payer: Self-pay | Admitting: *Deleted

## 2018-02-28 DIAGNOSIS — E119 Type 2 diabetes mellitus without complications: Secondary | ICD-10-CM

## 2018-02-28 DIAGNOSIS — R531 Weakness: Secondary | ICD-10-CM

## 2018-02-28 DIAGNOSIS — R5383 Other fatigue: Secondary | ICD-10-CM

## 2018-02-28 DIAGNOSIS — M791 Myalgia, unspecified site: Secondary | ICD-10-CM

## 2018-02-28 DIAGNOSIS — E7800 Pure hypercholesterolemia, unspecified: Secondary | ICD-10-CM

## 2018-03-01 ENCOUNTER — Other Ambulatory Visit: Payer: Medicare Other

## 2018-03-01 DIAGNOSIS — E119 Type 2 diabetes mellitus without complications: Secondary | ICD-10-CM

## 2018-03-01 DIAGNOSIS — R5383 Other fatigue: Secondary | ICD-10-CM

## 2018-03-01 DIAGNOSIS — E7800 Pure hypercholesterolemia, unspecified: Secondary | ICD-10-CM

## 2018-03-01 DIAGNOSIS — M791 Myalgia, unspecified site: Secondary | ICD-10-CM

## 2018-03-01 DIAGNOSIS — R531 Weakness: Secondary | ICD-10-CM

## 2018-03-01 LAB — BAYER DCA HB A1C WAIVED: HB A1C (BAYER DCA - WAIVED): 6.8 % (ref <7.0)

## 2018-03-03 LAB — BMP8+EGFR
BUN / CREAT RATIO: 30 — AB (ref 10–24)
BUN: 20 mg/dL (ref 8–27)
CO2: 19 mmol/L — ABNORMAL LOW (ref 20–29)
Calcium: 9.1 mg/dL (ref 8.6–10.2)
Chloride: 106 mmol/L (ref 96–106)
Creatinine, Ser: 0.66 mg/dL — ABNORMAL LOW (ref 0.76–1.27)
GFR calc Af Amer: 114 mL/min/{1.73_m2} (ref >59)
GFR calc non Af Amer: 99 mL/min/{1.73_m2} (ref >59)
Glucose: 146 mg/dL — ABNORMAL HIGH (ref 65–99)
Potassium: 4.5 mmol/L (ref 3.5–5.2)
Sodium: 141 mmol/L (ref 134–144)

## 2018-03-03 LAB — CBC WITH DIFFERENTIAL/PLATELET
Basophils Absolute: 0 10*3/uL (ref 0.0–0.2)
Basos: 1 %
EOS (ABSOLUTE): 0.4 10*3/uL (ref 0.0–0.4)
Eos: 7 %
Hematocrit: 43.3 % (ref 37.5–51.0)
Hemoglobin: 14.2 g/dL (ref 13.0–17.7)
Immature Grans (Abs): 0 10*3/uL (ref 0.0–0.1)
Immature Granulocytes: 0 %
Lymphocytes Absolute: 1.6 10*3/uL (ref 0.7–3.1)
Lymphs: 27 %
MCH: 29.9 pg (ref 26.6–33.0)
MCHC: 32.8 g/dL (ref 31.5–35.7)
MCV: 91 fL (ref 79–97)
Monocytes Absolute: 0.5 10*3/uL (ref 0.1–0.9)
Monocytes: 9 %
Neutrophils Absolute: 3.1 10*3/uL (ref 1.4–7.0)
Neutrophils: 56 %
PLATELETS: 200 10*3/uL (ref 150–450)
RBC: 4.75 x10E6/uL (ref 4.14–5.80)
RDW: 13.4 % (ref 11.6–15.4)
WBC: 5.7 10*3/uL (ref 3.4–10.8)

## 2018-03-03 LAB — HEPATIC FUNCTION PANEL
ALBUMIN: 4.2 g/dL (ref 3.8–4.8)
ALT: 18 IU/L (ref 0–44)
AST: 17 IU/L (ref 0–40)
Alkaline Phosphatase: 67 IU/L (ref 39–117)
BILIRUBIN, DIRECT: 0.08 mg/dL (ref 0.00–0.40)
Bilirubin Total: 0.2 mg/dL (ref 0.0–1.2)
Total Protein: 6.5 g/dL (ref 6.0–8.5)

## 2018-03-03 LAB — THYROID PANEL WITH TSH
Free Thyroxine Index: 2.2 (ref 1.2–4.9)
T3 Uptake Ratio: 28 % (ref 24–39)
T4, Total: 7.9 ug/dL (ref 4.5–12.0)
TSH: 1.29 u[IU]/mL (ref 0.450–4.500)

## 2018-03-03 LAB — LIPID PANEL
Chol/HDL Ratio: 4.3 ratio (ref 0.0–5.0)
Cholesterol, Total: 151 mg/dL (ref 100–199)
HDL: 35 mg/dL — ABNORMAL LOW (ref >39)
LDL CALC: 102 mg/dL — AB (ref 0–99)
Triglycerides: 70 mg/dL (ref 0–149)
VLDL Cholesterol Cal: 14 mg/dL (ref 5–40)

## 2018-03-03 LAB — TESTOSTERONE,FREE AND TOTAL
Testosterone, Free: 35 pg/mL — ABNORMAL HIGH (ref 6.6–18.1)
Testosterone: 193 ng/dL — ABNORMAL LOW (ref 264–916)

## 2018-03-03 LAB — VITAMIN B12: Vitamin B-12: 973 pg/mL (ref 232–1245)

## 2018-03-11 ENCOUNTER — Other Ambulatory Visit: Payer: Self-pay | Admitting: Family Medicine

## 2018-03-11 NOTE — Telephone Encounter (Signed)
Needs to be seen

## 2018-03-13 ENCOUNTER — Telehealth: Payer: Self-pay | Admitting: Family Medicine

## 2018-03-13 NOTE — Telephone Encounter (Signed)
Called wife, pt in background - aware of mychart  And notes reviewed with them.  appt made for tomorrow for follow up with Dr Laurance Flatten

## 2018-03-14 ENCOUNTER — Encounter: Payer: Self-pay | Admitting: Family Medicine

## 2018-03-14 ENCOUNTER — Ambulatory Visit (INDEPENDENT_AMBULATORY_CARE_PROVIDER_SITE_OTHER): Payer: Medicare Other | Admitting: Family Medicine

## 2018-03-14 VITALS — BP 129/77 | HR 70 | Temp 97.5°F | Ht 72.0 in | Wt 207.0 lb

## 2018-03-14 DIAGNOSIS — E7800 Pure hypercholesterolemia, unspecified: Secondary | ICD-10-CM

## 2018-03-14 DIAGNOSIS — R5383 Other fatigue: Secondary | ICD-10-CM | POA: Diagnosis not present

## 2018-03-14 DIAGNOSIS — R531 Weakness: Secondary | ICD-10-CM

## 2018-03-14 DIAGNOSIS — E119 Type 2 diabetes mellitus without complications: Secondary | ICD-10-CM | POA: Diagnosis not present

## 2018-03-14 DIAGNOSIS — R7989 Other specified abnormal findings of blood chemistry: Secondary | ICD-10-CM

## 2018-03-14 DIAGNOSIS — T07XXXA Unspecified multiple injuries, initial encounter: Secondary | ICD-10-CM

## 2018-03-14 DIAGNOSIS — I10 Essential (primary) hypertension: Secondary | ICD-10-CM | POA: Diagnosis not present

## 2018-03-14 DIAGNOSIS — G629 Polyneuropathy, unspecified: Secondary | ICD-10-CM
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
## 2018-03-14 NOTE — Patient Instructions (Addendum)
Medicare Annual Wellness Visit  [facility] and the medical providers at [facility] strive to bring you the best medical care.  In doing so we not only want to address your current medical conditions and concerns but also to detect new conditions early and prevent illness, disease and health-related problems.    Medicare offers a yearly Wellness Visit which allows our clinical staff to assess your need for preventative services including immunizations, lifestyle education, counseling to decrease risk of preventable diseases and screening for fall risk and other medical concerns.    This visit is provided free of charge (no copay) for all Medicare recipients. The clinical pharmacists at [facility] have begun to conduct these Wellness Visits which will also include a thorough review of all your medications.    As you primary medical provider recommend that you make an appointment for your Annual Wellness Visit if you have not done so already this year.  You may set up this appointment before you leave today or you may call back ([phone]) and schedule an appointment.  Please make sure when you call that you mention that you are scheduling your Annual Wellness Visit with the clinical pharmacist so that the appointment may be made for the proper length of time.     Continue current medications. Continue good therapeutic lifestyle changes which include good diet and exercise. Fall precautions discussed with patient. If an FOBT was given today- please return it to our front desk. If you are over 73 years old - you may need Prevnar 43 or the adult Pneumonia vaccine.  **Flu shots are available--- please call and schedule a FLU-CLINIC appointment**  After your visit with [place] today you will receive a survey in the mail or online from [employer] regarding your care with [place]. Please take a moment to fill this out. Your feedback is very  important to [place] as you can help [place] better understand your patient needs as well as improve your experience and satisfaction. WE CARE ABOUT YOU!!!   Continue with ibuprofen 800 twice daily or try to reduce gradually and substitute extra strength Tylenol if needed Continue with gabapentin and try to reduce this and if you reduce it and your nerve pain gets worse increase the dose again. We will check with the clinical pharmacist to make sure that the side effects you discussed today are not part of the gabapentin profile. Make sure you watch her diet closely and exercise as much as tolerated with your health condition Continue taking metformin We will call with the results of the testosterone levels as soon as they are returned and this may account for some of your fatigue and tiredness. Better blood sugar control is important and please  bring blood sugar readings into next visit. We will be checking a testosterone level and PSA today We will also be checking urinalysis and call with those results as soon as they become available Return the FOBT

## 2018-03-16 ENCOUNTER — Other Ambulatory Visit: Payer: Self-pay | Admitting: Family Medicine

## 2018-03-16 LAB — PSA, TOTAL AND FREE
PSA, Free Pct: 10 %
PSA, Free: 0.06 ng/mL
Prostate Specific Ag, Serum: 0.6 ng/mL (ref 0.0–4.0)

## 2018-03-16 LAB — TESTOSTERONE,FREE AND TOTAL
Testosterone, Free: 40.6 pg/mL — ABNORMAL HIGH (ref 6.6–18.1)
Testosterone: 313 ng/dL (ref 264–916)

## 2018-03-18 ENCOUNTER — Other Ambulatory Visit: Payer: Self-pay | Admitting: *Deleted

## 2018-03-18 ENCOUNTER — Telehealth: Payer: Self-pay | Admitting: Family Medicine

## 2018-03-18 DIAGNOSIS — N529 Male erectile dysfunction, unspecified: Secondary | ICD-10-CM

## 2018-03-18 DIAGNOSIS — R5383 Other fatigue: Secondary | ICD-10-CM

## 2018-03-18 DIAGNOSIS — R7989 Other specified abnormal findings of blood chemistry: Secondary | ICD-10-CM

## 2018-03-18 NOTE — Telephone Encounter (Signed)
Pt called about labs

## 2018-03-20 ENCOUNTER — Other Ambulatory Visit: Payer: Self-pay

## 2018-03-20 ENCOUNTER — Other Ambulatory Visit (HOSPITAL_COMMUNITY): Payer: Self-pay | Admitting: Family Medicine

## 2018-03-20 ENCOUNTER — Telehealth: Payer: Self-pay | Admitting: *Deleted

## 2018-03-20 ENCOUNTER — Telehealth: Payer: Self-pay | Admitting: Family Medicine

## 2018-03-20 ENCOUNTER — Ambulatory Visit (HOSPITAL_COMMUNITY)
Admission: RE | Admit: 2018-03-20 | Discharge: 2018-03-20 | Disposition: A | Discharge status: Home / Self Care | Payer: Medicare Other | Source: Ambulatory Visit | Attending: Family Medicine | Admitting: Family Medicine

## 2018-03-20 DIAGNOSIS — R52 Pain, unspecified: Secondary | ICD-10-CM | POA: Insufficient documentation

## 2018-03-20 DIAGNOSIS — M79661 Pain in right lower leg: Secondary | ICD-10-CM

## 2018-03-20 NOTE — Telephone Encounter (Signed)
Spoke with Dr Laurance Flatten and referral coordinator. Order placed and headed to [facility] now

## 2018-03-20 NOTE — Addendum Note (Signed)
Addended by: Zannie Cove on: 03/20/2018 04:43 PM   Modules accepted: Orders

## 2018-03-20 NOTE — Telephone Encounter (Signed)
Patient called complained of leg pain that started in the calf and has worsened over the last 2 days moving to his thigh.  Patients wife states that she spoke to doctor at [facility] and she would like for patient to have [place] and xray done of leg to check for blood clot.  Patient's wife will take patient to ED but would like to know if Dr. Laurance Flatten would place order for [place] to be done.

## 2018-03-20 NOTE — Telephone Encounter (Signed)
Duplicate

## 2018-03-20 NOTE — Telephone Encounter (Signed)
Patient's wife would like for an order to be put in for [place] of lower right leg to check for blood clot

## 2018-03-21 ENCOUNTER — Other Ambulatory Visit (HOSPITAL_COMMUNITY): Payer: Self-pay | Admitting: Family Medicine

## 2018-03-21 ENCOUNTER — Ambulatory Visit: Payer: Self-pay | Admitting: Internal Medicine

## 2018-03-21 DIAGNOSIS — R52 Pain, unspecified: Secondary | ICD-10-CM

## 2018-03-25 ENCOUNTER — Other Ambulatory Visit: Payer: Self-pay | Admitting: Family Medicine

## 2018-04-03 ENCOUNTER — Other Ambulatory Visit: Payer: Self-pay | Admitting: Family Medicine

## 2018-04-08 ENCOUNTER — Other Ambulatory Visit: Payer: Self-pay | Admitting: Family Medicine

## 2018-04-11 ENCOUNTER — Encounter: Payer: Self-pay | Admitting: Family Medicine

## 2018-04-12 ENCOUNTER — Ambulatory Visit: Payer: Medicare Other | Admitting: Family Medicine

## 2018-04-17 ENCOUNTER — Other Ambulatory Visit: Payer: Self-pay | Admitting: Family Medicine

## 2018-04-18 ENCOUNTER — Telehealth: Payer: Self-pay | Admitting: Family Medicine

## 2018-04-18 NOTE — Telephone Encounter (Signed)
After talking to wife she just means it needs a refill

## 2018-04-19 MED ORDER — IBUPROFEN 800 MG PO TABS: 800.0000 mg | ORAL_TABLET | Freq: Three times a day (TID) | ORAL | 0 refills | Status: DC | PRN

## 2018-04-19 NOTE — Telephone Encounter (Signed)
Med was in med rec - refilled per [facility] verb.

## 2018-04-19 NOTE — Telephone Encounter (Signed)
Did dr. Laurance Flatten stop him from taking or is out of meds. Really needs to wait for dr. Laurance Flatten to addres

## 2018-05-14 ENCOUNTER — Other Ambulatory Visit: Payer: Self-pay | Admitting: Family Medicine

## 2018-05-31 ENCOUNTER — Telehealth: Payer: Self-pay | Admitting: Family Medicine

## 2018-05-31 ENCOUNTER — Other Ambulatory Visit: Payer: Self-pay | Admitting: *Deleted

## 2018-05-31 DIAGNOSIS — E119 Type 2 diabetes mellitus without complications: Secondary | ICD-10-CM

## 2018-05-31 DIAGNOSIS — R7989 Other specified abnormal findings of blood chemistry: Secondary | ICD-10-CM

## 2018-05-31 NOTE — Telephone Encounter (Signed)
Ordered labs

## 2018-06-03 ENCOUNTER — Other Ambulatory Visit: Payer: Self-pay

## 2018-06-03 ENCOUNTER — Other Ambulatory Visit: Payer: Medicare Other

## 2018-06-03 ENCOUNTER — Other Ambulatory Visit: Payer: Self-pay | Admitting: Family Medicine

## 2018-06-03 DIAGNOSIS — E119 Type 2 diabetes mellitus without complications: Secondary | ICD-10-CM

## 2018-06-03 DIAGNOSIS — R7989 Other specified abnormal findings of blood chemistry: Secondary | ICD-10-CM

## 2018-06-03 LAB — BAYER DCA HB A1C WAIVED: HB A1C (BAYER DCA - WAIVED): 5.1 % (ref <7.0)

## 2018-06-04 LAB — TESTOSTERONE,FREE AND TOTAL
Testosterone, Free: 38.9 pg/mL — ABNORMAL HIGH (ref 6.6–18.1)
Testosterone: 414 ng/dL (ref 264–916)

## 2018-06-05 ENCOUNTER — Telehealth: Payer: Self-pay | Admitting: Family Medicine

## 2018-06-05 ENCOUNTER — Encounter: Payer: Self-pay | Admitting: *Deleted

## 2018-06-06 NOTE — Telephone Encounter (Signed)
Pt called and aware of labs - faxing to urology now.

## 2018-06-18 ENCOUNTER — Other Ambulatory Visit: Payer: Self-pay | Admitting: Family Medicine

## 2018-06-24 ENCOUNTER — Other Ambulatory Visit: Payer: Self-pay | Admitting: Family Medicine

## 2018-07-04 ENCOUNTER — Telehealth: Payer: Self-pay | Admitting: Family Medicine

## 2018-07-04 NOTE — Telephone Encounter (Signed)
Please call.

## 2018-07-04 NOTE — Telephone Encounter (Signed)
LMTCB - jhb 07/04/18.

## 2018-08-04 ENCOUNTER — Emergency Department (HOSPITAL_COMMUNITY)
Admission: EM | Admit: 2018-08-04 | Discharge: 2018-08-04 | Disposition: A | Discharge status: Home / Self Care | Payer: Medicare Other | Attending: Emergency Medicine | Admitting: Emergency Medicine

## 2018-08-04 ENCOUNTER — Encounter (HOSPITAL_COMMUNITY): Payer: Self-pay

## 2018-08-04 ENCOUNTER — Other Ambulatory Visit: Payer: Self-pay

## 2018-08-04 ENCOUNTER — Emergency Department (HOSPITAL_COMMUNITY): Payer: Medicare Other

## 2018-08-04 DIAGNOSIS — Z7982 Long term (current) use of aspirin: Secondary | ICD-10-CM | POA: Diagnosis not present

## 2018-08-04 DIAGNOSIS — Z87891 Personal history of nicotine dependence: Secondary | ICD-10-CM | POA: Diagnosis not present

## 2018-08-04 DIAGNOSIS — T8149XA Infection following a procedure, other surgical site, initial encounter: Secondary | ICD-10-CM | POA: Diagnosis present

## 2018-08-04 DIAGNOSIS — Y658 Other specified misadventures during surgical and medical care: Secondary | ICD-10-CM | POA: Insufficient documentation

## 2018-08-04 DIAGNOSIS — Z79899 Other long term (current) drug therapy: Secondary | ICD-10-CM | POA: Insufficient documentation

## 2018-08-04 DIAGNOSIS — E119 Type 2 diabetes mellitus without complications: Secondary | ICD-10-CM | POA: Insufficient documentation

## 2018-08-04 DIAGNOSIS — Z7984 Long term (current) use of oral hypoglycemic drugs: Secondary | ICD-10-CM | POA: Insufficient documentation

## 2018-08-04 DIAGNOSIS — I10 Essential (primary) hypertension: Secondary | ICD-10-CM | POA: Diagnosis not present

## 2018-08-04 LAB — BASIC METABOLIC PANEL
Anion gap: 9 (ref 5–15)
BUN: 18 mg/dL (ref 8–23)
CO2: 25 mmol/L (ref 22–32)
Calcium: 9 mg/dL (ref 8.9–10.3)
Chloride: 102 mmol/L (ref 98–111)
Creatinine, Ser: 0.59 mg/dL — ABNORMAL LOW (ref 0.61–1.24)
GFR calc Af Amer: >60 mL/min (ref >60)
GFR calc non Af Amer: >60 mL/min (ref >60)
Glucose, Bld: 160 mg/dL — ABNORMAL HIGH (ref 70–99)
Potassium: 3.3 mmol/L — ABNORMAL LOW (ref 3.5–5.1)
Sodium: 136 mmol/L (ref 135–145)

## 2018-08-04 LAB — CBC WITH DIFFERENTIAL/PLATELET
Abs Immature Granulocytes: 0.04 10*3/uL (ref 0.00–0.07)
Basophils Absolute: 0 10*3/uL (ref 0.0–0.1)
Basophils Relative: 0 %
Eosinophils Absolute: 0 10*3/uL (ref 0.0–0.5)
Eosinophils Relative: 0 %
HCT: 41.4 % (ref 39.0–52.0)
Hemoglobin: 13.8 g/dL (ref 13.0–17.0)
Immature Granulocytes: 0 %
Lymphocytes Relative: 20 %
Lymphs Abs: 2.3 10*3/uL (ref 0.7–4.0)
MCH: 30.4 pg (ref 26.0–34.0)
MCHC: 33.3 g/dL (ref 30.0–36.0)
MCV: 91.2 fL (ref 80.0–100.0)
Monocytes Absolute: 1.4 10*3/uL — ABNORMAL HIGH (ref 0.1–1.0)
Monocytes Relative: 12 %
Neutro Abs: 7.9 10*3/uL — ABNORMAL HIGH (ref 1.7–7.7)
Neutrophils Relative %: 68 %
Platelets: 179 10*3/uL (ref 150–400)
RBC: 4.54 MIL/uL (ref 4.22–5.81)
RDW: 12.9 % (ref 11.5–15.5)
WBC: 11.8 10*3/uL — ABNORMAL HIGH (ref 4.0–10.5)
nRBC: 0 % (ref 0.0–0.2)

## 2018-08-04 LAB — LACTIC ACID, PLASMA: Lactic Acid, Venous: 1.3 mmol/L (ref 0.5–1.9)

## 2018-08-04 MED ORDER — CEPHALEXIN 500 MG PO CAPS: 500.0000 mg | ORAL_CAPSULE | Freq: Four times a day (QID) | ORAL | 0 refills | Status: DC

## 2018-08-04 MED ORDER — CEPHALEXIN 500 MG PO CAPS
500.0000 mg | ORAL_CAPSULE | Freq: Once | ORAL | Status: AC
  Administered 2018-08-04: 500 mg via ORAL
  Filled 2018-08-04: qty 1

## 2018-08-04 MED ORDER — IBUPROFEN 400 MG PO TABS
400.0000 mg | ORAL_TABLET | Freq: Once | ORAL | Status: AC
  Administered 2018-08-04: 400 mg via ORAL
  Filled 2018-08-04: qty 1

## 2018-08-04 NOTE — ED Provider Notes (Signed)
Medical screening examination/treatment/procedure(s) were conducted as a shared visit with non-physician practitioner(s) and myself.  I personally evaluated the patient during the encounter.      Results for orders placed or performed during the hospital encounter of 08/04/18  CBC with Differential  Result Value Ref Range   WBC 11.8 (H) 4.0 - 10.5 K/uL   RBC 4.54 4.22 - 5.81 MIL/uL   Hemoglobin 13.8 13.0 - 17.0 g/dL   HCT 41.4 39.0 - 52.0 %   MCV 91.2 80.0 - 100.0 fL   MCH 30.4 26.0 - 34.0 pg   MCHC 33.3 30.0 - 36.0 g/dL   RDW 12.9 11.5 - 15.5 %   Platelets 179 150 - 400 K/uL   nRBC 0.0 0.0 - 0.2 %   Neutrophils Relative % 68 %   Neutro Abs 7.9 (H) 1.7 - 7.7 K/uL   Lymphocytes Relative 20 %   Lymphs Abs 2.3 0.7 - 4.0 K/uL   Monocytes Relative 12 %   Monocytes Absolute 1.4 (H) 0.1 - 1.0 K/uL   Eosinophils Relative 0 %   Eosinophils Absolute 0.0 0.0 - 0.5 K/uL   Basophils Relative 0 %   Basophils Absolute 0.0 0.0 - 0.1 K/uL   Immature Granulocytes 0 %   Abs Immature Granulocytes 0.04 0.00 - 0.07 K/uL   Dg Elbow Complete Left  Result Date: 08/04/2018 CLINICAL DATA:  Pain EXAM: LEFT ELBOW - COMPLETE 3+ VIEW COMPARISON:  None. FINDINGS: There is extensive soft tissue swelling about the elbow. There appears to be a soft tissue defect posteriorly. Small calcifications are noted within the posterior aspect of the elbow. There may be a small joint effusion. There are degenerative changes of the elbow joint. There is an enthesophyte involving the posterior olecranon. This enthesophyte has decreased in size slightly from prior study which is of unknown clinical significance and may be related to the recent reported surgery. There is no acute displaced fracture or dislocation. IMPRESSION: 1. No acute displaced fracture or dislocation. 2. Extensive soft tissue swelling about the posterior aspect of the elbow. There appears to be a soft tissue defect posteriorly which may represent an ulceration  or laceration. 3. No definite radiographic evidence for osteomyelitis. 4. Probable small joint effusion. Electronically Signed   By: Constance Holster M.D.   On: 08/04/2018 21:50   Patient seen by me along with physician assistant.  Patient got concerned because it with a forehead thermometer infrared she he had a temp of 101.  But had 2 temps here and has been afebrile.  Has a mild leukocytosis x-ray does not show any radiographic evidence of osteomyelitis.  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  Patient had calls into the [facility] but did not get a call back.  He has follow-up there Wednesday.  Patient feels pretty confident he can talk to [facility] in the morning.  No apparent indication for admission at this time.  Will start patient back on Keflex.   Fredia Sorrow, MD 08/04/18 [id number]

## 2018-08-04 NOTE — ED Triage Notes (Signed)
Pt presents to ED with complaints of left elbow pain, redness and fluid drainage. Pt had surgery on elbow 06/24/18. Pt has had fever today 101.

## 2018-08-04 NOTE — ED Provider Notes (Signed)
[facility] EMERGENCY DEPARTMENT Provider Note   CSN: [id number] Arrival date & time: 08/04/18  2003    History   Chief Complaint Chief Complaint  Patient presents with  . Elbow Pain    HPI Melvin [name]. is a 68 y.o. male with history of recent surgery on 06/23/2020 left elbow who presents with left elbow pain, redness, fluid drainage.  Patient had a fever today up to 101.  He reports he is just not been feeling well starting yesterday.  He has been dealing with the elbow problems since his surgery and it has intermittently drained since then.  He was last on Keflex in the beginning of the month, however he is finished antibiotics at least 2 or 3 weeks ago.  Patient reports he has been using it more, which may account for some of the pain and soreness.  He has been taking ibuprofen at home with some relief.  Patient's surgeon is Dr. Celesta Aver at [facility].  Patient reports that at his last visit, they felt that the wound was healing as expected.     HPI  Past Medical History:  Diagnosis Date  . BCC (basal cell carcinoma), face   . Degeneration of intervertebral disc, site unspecified   . Depressive disorder, not elsewhere classified   . Esophageal stricture   . Essential hypertension, benign   . GERD (gastroesophageal reflux disease)   . Hiatal hernia   . Hyperplasia of prostate   . Osteoarthrosis and allied disorders   . Sleep apnea     Patient Active Problem List   Diagnosis Date Noted  . Postoperative leak   . Slow transit constipation   . Labile blood pressure   . Dry eyes   . Multiple trauma   . Urinary retention   . Constipation due to pain medication   . Hypoalbuminemia due to protein-calorie malnutrition ([place])   . Essential hypertension   . Sleep disturbance   . Trauma 06/27/2017  . UTI due to Klebsiella species   . Diabetes mellitus type 2 in nonobese (HCC)   . Depression   . Acute blood loss anemia   . AKI (acute kidney injury) ([place])   .  Hypomagnesemia   . Seasonal and perennial allergic rhinitis 02/17/2016  . Erectile dysfunction 04/15/2013  . Statin intolerance 04/15/2013  . Hyperlipidemia 04/15/2013  . BCC (basal cell carcinoma), face   . Essential hypertension, benign   . Hiatal hernia with gastroesophageal reflux    . Degeneration of intervertebral disc, site unspecified   . Esophageal stricture, history of   . Obstructive sleep apnea   . Depression   . Osteoarthritis   . BPH (benign prostatic hyperplasia)     Past Surgical History:  Procedure Laterality Date  . APPENDECTOMY  [id number]  . JOINT REPLACEMENT    . Left Hip REplaacement  01-1999  . LUMBAR FUSION  05-1998  . NASAL SEPTUM SURGERY  1989  . Right Hip Replacement  09-2002  . SPINE SURGERY    . VENTRAL HERNIA REPAIR  02-2000        Home Medications    Prior to Admission medications   Medication Sig Start Date End Date Taking? Authorizing Provider  amLODipine (NORVASC) 5 MG tablet TAKE 1 AND 1/2 TABLETS DAILY 05/14/18  Yes Chipper Herb, MD  Ascorbic Acid (VITAMIN C) 1000 MG tablet Take 1,000 mg by mouth daily.   Yes [provider]  aspirin 81 MG EC tablet Take 1 tablet (81  mg total) by mouth daily. 07/10/17  Yes Love, Ivan Anchors, PA-C  ezetimibe (ZETIA) 10 MG tablet TAKE ONE (1) TABLET EACH DAY 04/04/18  Yes Chipper Herb, MD  finasteride (PROPECIA) 1 MG tablet TAKE ONE (1) TABLET EACH DAY 06/25/18  Yes Chipper Herb, MD  FLUoxetine (PROZAC) 20 MG capsule Take 3 capsules (60 mg total) by mouth daily. (Please make follow-up appt) 06/18/18  Yes Chipper Herb, MD  gabapentin (NEURONTIN) 100 MG capsule TAKE 3 CAPSULES THREE TIMES A DAY 06/03/18  Yes Chipper Herb, MD  ibuprofen (ADVIL,MOTRIN) 800 MG tablet Take 1 tablet (800 mg total) by mouth every 8 (eight) hours as needed. 04/19/18  Yes Chipper Herb, MD  lisinopril (PRINIVIL,ZESTRIL) 2.5 MG tablet TAKE ONE TABLET BY MOUTH TWICE DAILY 08/28/17  Yes Chipper Herb, MD  Melatonin 3 MG TABS  Take 1 tablet (3 mg total) by mouth at bedtime. 07/10/17  Yes Love, Ivan Anchors, PA-C  metFORMIN (GLUCOPHAGE) 500 MG tablet Take 500 mg by mouth daily with breakfast.   Yes [provider]  Multiple Vitamin (MULTIVITAMIN WITH MINERALS) TABS tablet Take 1 tablet by mouth daily. 07/10/17  Yes Love, Ivan Anchors, PA-C  Olopatadine HCl 0.2 % SOLN Place 1 drop into both eyes daily.   Yes [provider]  omega-3 acid ethyl esters (LOVAZA) 1 g capsule TAKE 4 CAPSULES DAILY 01/14/18  Yes Chipper Herb, MD  Vitamin D, Ergocalciferol, (DRISDOL) 50000 units CAPS capsule TAKE 1 CAPSULE EVERY WEEK 08/21/17  Yes Chipper Herb, MD  cephALEXin (KEFLEX) 500 MG capsule Take 1 capsule (500 mg total) by mouth 4 (four) times daily. 08/04/18   Shilo Pauwels, Bea Graff, PA-C  fluticasone (FLONASE) 50 MCG/ACT nasal spray USE 1 TO 2 SPRAYS IN EACH NOSTRIL DAILY Patient not taking: Reported on 03/14/2018 11/02/17   Chipper Herb, MD  Misc Natural Products (OSTEO BI-FLEX JOINT SHIELD PO) Take 2 tablets by mouth daily.    [provider]  polyethylene glycol (MIRALAX / GLYCOLAX) packet Take 17 g by mouth 2 (two) times daily. Patient not taking: Reported on 11/29/2017 07/10/17   Love, Ivan Anchors, PA-C  protein supplement shake (PREMIER PROTEIN) LIQD Take 325 mLs (11 oz total) by mouth 3 (three) times daily with meals. 07/10/17   Bary Leriche, PA-C    Family History Family History  Problem Relation Age of Onset  . Lymphoma Mother   . Arthritis Mother   . Asthma Mother   . Kidney failure Father   . Hypertension Father   . Hypertension Brother   . Hyperlipidemia Brother   . Bipolar disorder Brother     Social History Social History   Tobacco Use  . Smoking status: Former Smoker    Types: Cigars  . Smokeless tobacco: Never Used  Substance Use Topics  . Alcohol use: Not Currently    Alcohol/week: 3.0 standard drinks    Types: 3 Glasses of wine per week    Comment: drinks half a bottle at nights  occasionally  . Drug use: No     Allergies   Ativan [lorazepam], Dilaudid [hydromorphone], Morphine, Seroquel [quetiapine], and Statins   Review of Systems Review of Systems  Constitutional: Negative for chills and fever.  HENT: Negative for facial swelling and sore throat.   Respiratory: Negative for shortness of breath.   Cardiovascular: Negative for chest pain.  Gastrointestinal: Negative for abdominal pain, nausea and vomiting.  Genitourinary: Negative for dysuria.  Musculoskeletal: Positive for arthralgias  and joint swelling. Negative for back pain.  Skin: Positive for color change and wound. Negative for rash.  Neurological: Negative for headaches.  Psychiatric/Behavioral: The patient is not nervous/anxious.      Physical Exam Updated Vital Signs BP (!) 141/77   Pulse 77   Temp 98.3 F (36.8 C) (Oral)   Resp 18   Ht 6' (1.829 m)   Wt 90.7 kg   SpO2 95%   BMI 27.12 kg/m   Physical Exam Vitals signs and nursing note reviewed.  Constitutional:      General: He is not in acute distress.    Appearance: He is well-developed. He is not diaphoretic.  HENT:     Head: Normocephalic and atraumatic.     Mouth/Throat:     Pharynx: No oropharyngeal exudate.  Eyes:     General: No scleral icterus.       Right eye: No discharge.        Left eye: No discharge.     Conjunctiva/sclera: Conjunctivae normal.     Pupils: Pupils are equal, round, and reactive to light.  Neck:     Musculoskeletal: Normal range of motion and neck supple.     Thyroid: No thyromegaly.  Cardiovascular:     Rate and Rhythm: Normal rate and regular rhythm.     Heart sounds: Normal heart sounds. No murmur. No friction rub. No gallop.   Pulmonary:     Effort: Pulmonary effort is normal. No respiratory distress.     Breath sounds: Normal breath sounds. No stridor. No wheezing or rales.  Abdominal:     General: Bowel sounds are normal. There is no distension.     Palpations: Abdomen is soft.      Tenderness: There is no abdominal tenderness. There is no guarding or rebound.  Musculoskeletal:     Comments: 2.5-3 cm in diameter granulated wound to the left olecranon with clear/yellow drainage soaking the bandage; range of motion intact, mild pain; mild erythema surrounding the wound  Lymphadenopathy:     Cervical: No cervical adenopathy.  Skin:    General: Skin is warm and dry.     Coloration: Skin is not pale.     Findings: No rash.  Neurological:     Mental Status: He is alert.     Coordination: Coordination normal.      ED Treatments / Results  Labs (all labs ordered are listed, but only abnormal results are displayed) Labs Reviewed  BASIC METABOLIC PANEL - Abnormal; Notable for the following components:      Result Value   Potassium 3.3 (*)    Glucose, Bld 160 (*)    Creatinine, Ser 0.59 (*)    All other components within normal limits  CBC WITH DIFFERENTIAL/PLATELET - Abnormal; Notable for the following components:   WBC 11.8 (*)    Neutro Abs 7.9 (*)    Monocytes Absolute 1.4 (*)    All other components within normal limits  AEROBIC CULTURE (SUPERFICIAL SPECIMEN)  LACTIC ACID, PLASMA    EKG None  Radiology Dg Elbow Complete Left  Result Date: 08/04/2018 CLINICAL DATA:  Pain EXAM: LEFT ELBOW - COMPLETE 3+ VIEW COMPARISON:  None. FINDINGS: There is extensive soft tissue swelling about the elbow. There appears to be a soft tissue defect posteriorly. Small calcifications are noted within the posterior aspect of the elbow. There may be a small joint effusion. There are degenerative changes of the elbow joint. There is an enthesophyte involving the posterior olecranon. This enthesophyte  has decreased in size slightly from prior study which is of unknown clinical significance and may be related to the recent reported surgery. There is no acute displaced fracture or dislocation. IMPRESSION: 1. No acute displaced fracture or dislocation. 2. Extensive soft tissue swelling  about the posterior aspect of the elbow. There appears to be a soft tissue defect posteriorly which may represent an ulceration or laceration. 3. No definite radiographic evidence for osteomyelitis. 4. Probable small joint effusion. Electronically Signed   By: Constance Holster M.D.   On: 08/04/2018 21:50    Procedures Procedures (including critical care time)  Medications Ordered in ED Medications  cephALEXin (KEFLEX) capsule 500 mg (has no administration in time range)  ibuprofen (ADVIL) tablet 400 mg (400 mg Oral Given 08/04/18 2206)     Initial Impression / Assessment and Plan / ED Course  I have reviewed the triage vital signs and the nursing notes.  Pertinent labs & imaging results that were available during my care of the patient were reviewed by me and considered in my medical decision making (see chart for details).        Patient presenting with ongoing elbow pain and draining wound from recent surgery last month.  Patient reports the wound is mostly unchanged except for little more drainage than usual.  He is concerned that he had a fever up to 101 temporal.  Patient has mild leukocytosis at 11.8.  BMP shows potassium 3.3, creatinine 0.59.  Lactic acid is negative.  Patient is very well-appearing with stable vitals.  Will treat with Keflex and have patient call his orthopedic surgeon tomorrow.  He already has an appointment scheduled in 3 days.  Strict return precautions given.  Patient understands agrees with plan.  Patient vital stable and discharged in satisfactory condition.  Patient also evaluated by my attending, Dr. Rogene Houston, who guided the patient's management and agrees with plan.  Final Clinical Impressions(s) / ED Diagnoses   Final diagnoses:  Wound infection after surgery    ED Discharge Orders         Ordered    cephALEXin (KEFLEX) 500 MG capsule  4 times daily     08/04/18 2234           Frederica Kuster, PA-C 08/04/18 2235    Fredia Sorrow, MD  08/08/18 [phone]

## 2018-08-04 NOTE — Discharge Instructions (Addendum)
Take Keflex as prescribed until completed.  Make sure to follow-up with your orthopedic surgeon by calling tomorrow.  They may want to see you sooner than Wednesday.  If you develop an any new or worsening symptoms including racing heart rate, passing out, increasing pain and redness to your elbow, or persistent fever despite medications, please return to your closest emergency department.

## 2018-08-07 LAB — AEROBIC CULTURE W GRAM STAIN (SUPERFICIAL SPECIMEN)

## 2018-08-07 LAB — AEROBIC CULTURE? (SUPERFICIAL SPECIMEN)

## 2018-08-07 LAB — AEROBIC CULTURE  (SUPERFICIAL SPECIMEN)

## 2018-08-08 ENCOUNTER — Telehealth: Payer: Self-pay

## 2018-08-08 NOTE — Telephone Encounter (Signed)
Post ED Visit - Positive Culture Follow-up  Culture report reviewed by antimicrobial stewardship pharmacist: [place] Team []  Elenor Quinones, Pharm.D. []  Heide Guile, Pharm.D., BCPS AQ-ID []  Parks Neptune, Pharm.D., BCPS []  Alycia Rossetti, Pharm.D., BCPS []  [place], Pharm.D., BCPS, AAHIVP []  Legrand Como, Pharm.D., BCPS, AAHIVP []  Salome Arnt, PharmD, BCPS []  Johnnette Gourd, PharmD, BCPS []  Hughes Better, PharmD, BCPS []  Leeroy Cha, PharmD []  Laqueta Linden, PharmD, BCPS []  Albertina Parr, PharmD [place] Team []  Leodis Sias, PharmD []  Lindell Spar, PharmD []  Royetta Asal, PharmD []  Graylin Shiver, Rph []  Rema Fendt) Glennon Mac, PharmD []  Arlyn Dunning, PharmD []  Netta Cedars, PharmD []  Dia Sitter, PharmD []  Leone Haven, PharmD []  Gretta Arab, PharmD []  Theodis Shove, PharmD []  Peggyann Juba, PharmD []  Reuel Boom, PharmD   Positive urine culture Treated with Cephalexin, organism sensitive to the same and no further patient follow-up is required at this time.  Genia Del 08/08/2018, 10:17 AM

## 2018-08-12 ENCOUNTER — Other Ambulatory Visit: Payer: Self-pay | Admitting: Family Medicine

## 2018-08-12 ENCOUNTER — Encounter: Payer: Self-pay | Admitting: *Deleted

## 2018-08-12 NOTE — Telephone Encounter (Signed)
Patient needs appointment to establish with a new provider in the office as he is a previous patient of Dr. Laurance Flatten.

## 2018-08-27 ENCOUNTER — Other Ambulatory Visit: Payer: Self-pay | Admitting: Family Medicine

## 2018-09-02 ENCOUNTER — Other Ambulatory Visit: Payer: Self-pay | Admitting: Family Medicine

## 2018-09-11 ENCOUNTER — Other Ambulatory Visit: Payer: Self-pay

## 2018-09-11 ENCOUNTER — Other Ambulatory Visit: Payer: Medicare Other

## 2018-09-17 ENCOUNTER — Other Ambulatory Visit: Payer: Self-pay | Admitting: Family Medicine

## 2018-09-24 ENCOUNTER — Other Ambulatory Visit: Payer: Self-pay | Admitting: Family Medicine

## 2018-09-25 ENCOUNTER — Telehealth: Payer: Self-pay | Admitting: Family Medicine

## 2018-09-25 MED ORDER — FLUOXETINE HCL 20 MG PO CAPS: 60.0000 mg | ORAL_CAPSULE | Freq: Every day | ORAL | 0 refills | Status: DC

## 2018-09-25 NOTE — Telephone Encounter (Signed)
Pt aware RX was sent 09/18/18-- will resend now

## 2018-10-09 ENCOUNTER — Ambulatory Visit (INDEPENDENT_AMBULATORY_CARE_PROVIDER_SITE_OTHER): Payer: Medicare Other | Admitting: Family Medicine

## 2018-10-09 ENCOUNTER — Encounter: Payer: Self-pay | Admitting: Family Medicine

## 2018-10-09 DIAGNOSIS — Z87828 Personal history of other (healed) physical injury and trauma: Secondary | ICD-10-CM

## 2018-10-09 DIAGNOSIS — K2100 Gastro-esophageal reflux disease with esophagitis, without bleeding: Secondary | ICD-10-CM

## 2018-10-09 DIAGNOSIS — E1141 Type 2 diabetes mellitus with diabetic mononeuropathy: Secondary | ICD-10-CM

## 2018-10-09 DIAGNOSIS — K222 Esophageal obstruction: Secondary | ICD-10-CM

## 2018-10-09 DIAGNOSIS — E559 Vitamin D deficiency, unspecified: Secondary | ICD-10-CM

## 2018-10-09 DIAGNOSIS — E785 Hyperlipidemia, unspecified: Secondary | ICD-10-CM

## 2018-10-09 DIAGNOSIS — E1159 Type 2 diabetes mellitus with other circulatory complications: Secondary | ICD-10-CM | POA: Diagnosis not present

## 2018-10-09 DIAGNOSIS — I10 Essential (primary) hypertension: Secondary | ICD-10-CM

## 2018-10-09 DIAGNOSIS — F339 Major depressive disorder, recurrent, unspecified: Secondary | ICD-10-CM | POA: Diagnosis not present

## 2018-10-09 DIAGNOSIS — N400 Enlarged prostate without lower urinary tract symptoms: Secondary | ICD-10-CM

## 2018-10-09 DIAGNOSIS — E1169 Type 2 diabetes mellitus with other specified complication: Secondary | ICD-10-CM

## 2018-10-09 MED ORDER — EZETIMIBE 10 MG PO TABS: 10.0000 mg | ORAL_TABLET | Freq: Every day | ORAL | 0 refills | Status: DC

## 2018-10-09 MED ORDER — FLUOXETINE HCL 20 MG PO CAPS: 60.0000 mg | ORAL_CAPSULE | Freq: Every day | ORAL | 1 refills | Status: DC

## 2018-10-09 MED ORDER — AMLODIPINE BESYLATE 5 MG PO TABS: 7.5000 mg | ORAL_TABLET | Freq: Every day | ORAL | 1 refills | Status: DC

## 2018-10-09 MED ORDER — VITAMIN D (ERGOCALCIFEROL) 1.25 MG (50000 UNIT) PO CAPS: 50000.0000 [IU] | ORAL_CAPSULE | ORAL | 0 refills | Status: DC

## 2018-10-09 MED ORDER — PANTOPRAZOLE SODIUM 40 MG PO TBEC: 40.0000 mg | DELAYED_RELEASE_TABLET | Freq: Every day | ORAL | 3 refills | Status: DC

## 2018-10-09 MED ORDER — LISINOPRIL 2.5 MG PO TABS: 2.5000 mg | ORAL_TABLET | Freq: Two times a day (BID) | ORAL | 0 refills | Status: DC

## 2018-10-09 MED ORDER — GABAPENTIN 100 MG PO CAPS: 300.0000 mg | ORAL_CAPSULE | Freq: Three times a day (TID) | ORAL | 1 refills | Status: DC

## 2018-10-09 MED ORDER — FINASTERIDE 1 MG PO TABS: ORAL_TABLET | ORAL | 0 refills | Status: DC
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
## 2018-10-22 ENCOUNTER — Other Ambulatory Visit: Payer: Self-pay | Admitting: Family Medicine

## 2018-10-22 DIAGNOSIS — E559 Vitamin D deficiency, unspecified: Secondary | ICD-10-CM

## 2018-10-24 ENCOUNTER — Other Ambulatory Visit: Payer: Self-pay | Admitting: Family Medicine

## 2018-11-09 ENCOUNTER — Other Ambulatory Visit: Payer: Self-pay

## 2018-11-09 ENCOUNTER — Encounter (HOSPITAL_COMMUNITY): Payer: Self-pay

## 2018-11-09 ENCOUNTER — Emergency Department (HOSPITAL_COMMUNITY): Payer: Medicare Other

## 2018-11-09 ENCOUNTER — Inpatient Hospital Stay (HOSPITAL_COMMUNITY)
Admission: EM | Admit: 2018-11-09 | Discharge: 2018-11-12 | DRG: 872 | Disposition: A | Discharge status: Home / Self Care | Payer: Medicare Other | Attending: Family Medicine | Admitting: Family Medicine

## 2018-11-09 DIAGNOSIS — Z96643 Presence of artificial hip joint, bilateral: Secondary | ICD-10-CM | POA: Diagnosis present

## 2018-11-09 DIAGNOSIS — Z87891 Personal history of nicotine dependence: Secondary | ICD-10-CM

## 2018-11-09 DIAGNOSIS — D696 Thrombocytopenia, unspecified: Secondary | ICD-10-CM | POA: Diagnosis present

## 2018-11-09 DIAGNOSIS — Z85828 Personal history of other malignant neoplasm of skin: Secondary | ICD-10-CM

## 2018-11-09 DIAGNOSIS — Z86711 Personal history of pulmonary embolism: Secondary | ICD-10-CM | POA: Diagnosis not present

## 2018-11-09 DIAGNOSIS — Z807 Family history of other malignant neoplasms of lymphoid, hematopoietic and related tissues: Secondary | ICD-10-CM

## 2018-11-09 DIAGNOSIS — A4159 Other Gram-negative sepsis: Principal | ICD-10-CM | POA: Diagnosis present

## 2018-11-09 DIAGNOSIS — Z888 Allergy status to other drugs, medicaments and biological substances status: Secondary | ICD-10-CM

## 2018-11-09 DIAGNOSIS — I152 Hypertension secondary to endocrine disorders: Secondary | ICD-10-CM | POA: Diagnosis present

## 2018-11-09 DIAGNOSIS — E1141 Type 2 diabetes mellitus with diabetic mononeuropathy: Secondary | ICD-10-CM | POA: Diagnosis not present

## 2018-11-09 DIAGNOSIS — Z8249 Family history of ischemic heart disease and other diseases of the circulatory system: Secondary | ICD-10-CM | POA: Diagnosis not present

## 2018-11-09 DIAGNOSIS — R2681 Unsteadiness on feet: Secondary | ICD-10-CM

## 2018-11-09 DIAGNOSIS — E860 Dehydration: Secondary | ICD-10-CM | POA: Diagnosis present

## 2018-11-09 DIAGNOSIS — R350 Frequency of micturition: Secondary | ICD-10-CM | POA: Diagnosis present

## 2018-11-09 DIAGNOSIS — A419 Sepsis, unspecified organism: Secondary | ICD-10-CM | POA: Diagnosis not present

## 2018-11-09 DIAGNOSIS — J302 Other seasonal allergic rhinitis: Secondary | ICD-10-CM | POA: Diagnosis present

## 2018-11-09 DIAGNOSIS — R6520 Severe sepsis without septic shock: Secondary | ICD-10-CM | POA: Diagnosis present

## 2018-11-09 DIAGNOSIS — K219 Gastro-esophageal reflux disease without esophagitis: Secondary | ICD-10-CM | POA: Diagnosis present

## 2018-11-09 DIAGNOSIS — N390 Urinary tract infection, site not specified: Secondary | ICD-10-CM | POA: Diagnosis present

## 2018-11-09 DIAGNOSIS — N401 Enlarged prostate with lower urinary tract symptoms: Secondary | ICD-10-CM | POA: Diagnosis present

## 2018-11-09 DIAGNOSIS — Z7982 Long term (current) use of aspirin: Secondary | ICD-10-CM

## 2018-11-09 DIAGNOSIS — G4733 Obstructive sleep apnea (adult) (pediatric): Secondary | ICD-10-CM

## 2018-11-09 DIAGNOSIS — Z20828 Contact with and (suspected) exposure to other viral communicable diseases: Secondary | ICD-10-CM | POA: Diagnosis present

## 2018-11-09 DIAGNOSIS — Z825 Family history of asthma and other chronic lower respiratory diseases: Secondary | ICD-10-CM

## 2018-11-09 DIAGNOSIS — Z79890 Hormone replacement therapy (postmenopausal): Secondary | ICD-10-CM

## 2018-11-09 DIAGNOSIS — Z9989 Dependence on other enabling machines and devices: Secondary | ICD-10-CM

## 2018-11-09 DIAGNOSIS — Z23 Encounter for immunization: Secondary | ICD-10-CM

## 2018-11-09 DIAGNOSIS — E1169 Type 2 diabetes mellitus with other specified complication: Secondary | ICD-10-CM | POA: Diagnosis present

## 2018-11-09 DIAGNOSIS — E785 Hyperlipidemia, unspecified: Secondary | ICD-10-CM | POA: Diagnosis present

## 2018-11-09 DIAGNOSIS — E1159 Type 2 diabetes mellitus with other circulatory complications: Secondary | ICD-10-CM | POA: Diagnosis present

## 2018-11-09 DIAGNOSIS — R509 Fever, unspecified: Secondary | ICD-10-CM | POA: Diagnosis not present

## 2018-11-09 DIAGNOSIS — F339 Major depressive disorder, recurrent, unspecified: Secondary | ICD-10-CM | POA: Diagnosis present

## 2018-11-09 DIAGNOSIS — Z7984 Long term (current) use of oral hypoglycemic drugs: Secondary | ICD-10-CM

## 2018-11-09 DIAGNOSIS — I10 Essential (primary) hypertension: Secondary | ICD-10-CM | POA: Diagnosis present

## 2018-11-09 DIAGNOSIS — N400 Enlarged prostate without lower urinary tract symptoms: Secondary | ICD-10-CM | POA: Diagnosis present

## 2018-11-09 DIAGNOSIS — Z7951 Long term (current) use of inhaled steroids: Secondary | ICD-10-CM

## 2018-11-09 DIAGNOSIS — E872 Acidosis, unspecified: Secondary | ICD-10-CM | POA: Diagnosis present

## 2018-11-09 DIAGNOSIS — Z981 Arthrodesis status: Secondary | ICD-10-CM | POA: Diagnosis not present

## 2018-11-09 DIAGNOSIS — Z8261 Family history of arthritis: Secondary | ICD-10-CM

## 2018-11-09 DIAGNOSIS — N179 Acute kidney failure, unspecified: Secondary | ICD-10-CM | POA: Diagnosis present

## 2018-11-09 DIAGNOSIS — Z8349 Family history of other endocrine, nutritional and metabolic diseases: Secondary | ICD-10-CM

## 2018-11-09 DIAGNOSIS — Z885 Allergy status to narcotic agent status: Secondary | ICD-10-CM

## 2018-11-09 DIAGNOSIS — Z841 Family history of disorders of kidney and ureter: Secondary | ICD-10-CM

## 2018-11-09 DIAGNOSIS — Z818 Family history of other mental and behavioral disorders: Secondary | ICD-10-CM

## 2018-11-09 DIAGNOSIS — Z79899 Other long term (current) drug therapy: Secondary | ICD-10-CM

## 2018-11-09 LAB — CBC WITH DIFFERENTIAL/PLATELET
Abs Immature Granulocytes: 0.15 10*3/uL — ABNORMAL HIGH (ref 0.00–0.07)
Basophils Absolute: 0 10*3/uL (ref 0.0–0.1)
Basophils Relative: 0 %
Eosinophils Absolute: 0 10*3/uL (ref 0.0–0.5)
Eosinophils Relative: 0 %
HCT: 41.2 % (ref 39.0–52.0)
Hemoglobin: 13.5 g/dL (ref 13.0–17.0)
Immature Granulocytes: 1 %
Lymphocytes Relative: 1 %
Lymphs Abs: 0.1 10*3/uL — ABNORMAL LOW (ref 0.7–4.0)
MCH: 29.9 pg (ref 26.0–34.0)
MCHC: 32.8 g/dL (ref 30.0–36.0)
MCV: 91.4 fL (ref 80.0–100.0)
Monocytes Absolute: 1 10*3/uL (ref 0.1–1.0)
Monocytes Relative: 6 %
Neutro Abs: 16.7 10*3/uL — ABNORMAL HIGH (ref 1.7–7.7)
Neutrophils Relative %: 92 %
Platelets: 120 10*3/uL — ABNORMAL LOW (ref 150–400)
RBC: 4.51 MIL/uL (ref 4.22–5.81)
RDW: 14.2 % (ref 11.5–15.5)
WBC: 18 10*3/uL — ABNORMAL HIGH (ref 4.0–10.5)
nRBC: 0 % (ref 0.0–0.2)

## 2018-11-09 LAB — APTT: aPTT: 31 seconds (ref 24–36)

## 2018-11-09 LAB — LACTIC ACID, PLASMA
Lactic Acid, Venous: 2.8 mmol/L (ref 0.5–1.9)
Lactic Acid, Venous: 2.9 mmol/L (ref 0.5–1.9)
Lactic Acid, Venous: 2.6 mmol/L (ref 0.5–1.9)

## 2018-11-09 LAB — URINALYSIS, ROUTINE W REFLEX MICROSCOPIC
Bilirubin Urine: NEGATIVE
Glucose, UA: NEGATIVE mg/dL
Hgb urine dipstick: NEGATIVE
Ketones, ur: NEGATIVE mg/dL
Nitrite: NEGATIVE
Protein, ur: NEGATIVE mg/dL
Specific Gravity, Urine: 1.016 (ref 1.005–1.030)
pH: 5 (ref 5.0–8.0)

## 2018-11-09 LAB — PROTIME-INR
INR: 1.2 (ref 0.8–1.2)
Prothrombin Time: 14.6 seconds (ref 11.4–15.2)

## 2018-11-09 LAB — GLUCOSE, CAPILLARY: Glucose-Capillary: 169 mg/dL — ABNORMAL HIGH (ref 70–99)

## 2018-11-09 LAB — COMPREHENSIVE METABOLIC PANEL
ALT: 28 U/L (ref 0–44)
AST: 29 U/L (ref 15–41)
Albumin: 3.5 g/dL (ref 3.5–5.0)
Alkaline Phosphatase: 42 U/L (ref 38–126)
Anion gap: 10 (ref 5–15)
BUN: 39 mg/dL — ABNORMAL HIGH (ref 8–23)
CO2: 21 mmol/L — ABNORMAL LOW (ref 22–32)
Calcium: 8.3 mg/dL — ABNORMAL LOW (ref 8.9–10.3)
Chloride: 102 mmol/L (ref 98–111)
Creatinine, Ser: 1.37 mg/dL — ABNORMAL HIGH (ref 0.61–1.24)
GFR calc Af Amer: >60 mL/min (ref >60)
GFR calc non Af Amer: 52 mL/min — ABNORMAL LOW (ref >60)
Glucose, Bld: 227 mg/dL — ABNORMAL HIGH (ref 70–99)
Potassium: 3.6 mmol/L (ref 3.5–5.1)
Sodium: 133 mmol/L — ABNORMAL LOW (ref 135–145)
Total Bilirubin: 0.2 mg/dL — ABNORMAL LOW (ref 0.3–1.2)
Total Protein: 6.3 g/dL — ABNORMAL LOW (ref 6.5–8.1)

## 2018-11-09 LAB — SARS CORONAVIRUS 2 BY RT PCR (HOSPITAL ORDER, PERFORMED IN ~~LOC~~ HOSPITAL LAB): SARS Coronavirus 2: NEGATIVE

## 2018-11-09 LAB — TROPONIN I (HIGH SENSITIVITY)
Troponin I (High Sensitivity): 7 ng/L (ref <18)
Troponin I (High Sensitivity): 6 ng/L (ref <18)

## 2018-11-09 LAB — LIPASE, BLOOD: Lipase: 19 U/L (ref 11–51)

## 2018-11-09 MED ORDER — SODIUM CHLORIDE 0.9 % IV SOLN
2.0000 g | INTRAVENOUS | Status: DC
  Administered 2018-11-09 – 2018-11-11 (×3): 2 g via INTRAVENOUS
  Filled 2018-11-09 (×3): qty 20

## 2018-11-09 MED ORDER — POLYETHYLENE GLYCOL 3350 17 G PO PACK: 17.0000 g | PACK | Freq: Every day | ORAL | Status: DC | PRN

## 2018-11-09 MED ORDER — SODIUM CHLORIDE 0.9 % IV BOLUS (SEPSIS)
1000.0000 mL | Freq: Once | INTRAVENOUS | Status: AC
  Administered 2018-11-09: 16:00:00 1000 mL via INTRAVENOUS

## 2018-11-09 MED ORDER — ALBUTEROL SULFATE (2.5 MG/3ML) 0.083% IN NEBU: 2.5000 mg | INHALATION_SOLUTION | RESPIRATORY_TRACT | Status: DC | PRN

## 2018-11-09 MED ORDER — ACETAMINOPHEN 650 MG RE SUPP
650.0000 mg | Freq: Four times a day (QID) | RECTAL | Status: DC | PRN
  Filled 2018-11-09: qty 1

## 2018-11-09 MED ORDER — HEPARIN SODIUM (PORCINE) 5000 UNIT/ML IJ SOLN
5000.0000 [IU] | Freq: Three times a day (TID) | INTRAMUSCULAR | Status: DC
  Administered 2018-11-09 – 2018-11-10 (×2): 5000 [IU] via SUBCUTANEOUS
  Filled 2018-11-09 (×2): qty 1

## 2018-11-09 MED ORDER — LABETALOL HCL 5 MG/ML IV SOLN: 10.0000 mg | INTRAVENOUS | Status: DC | PRN

## 2018-11-09 MED ORDER — VITAMIN C 500 MG PO TABS
1000.0000 mg | ORAL_TABLET | Freq: Every day | ORAL | Status: DC
  Administered 2018-11-09 – 2018-11-12 (×4): 1000 mg via ORAL
  Filled 2018-11-09 (×4): qty 2

## 2018-11-09 MED ORDER — GABAPENTIN 300 MG PO CAPS
300.0000 mg | ORAL_CAPSULE | Freq: Three times a day (TID) | ORAL | Status: DC
  Administered 2018-11-09 – 2018-11-12 (×8): 300 mg via ORAL
  Filled 2018-11-09 (×8): qty 1

## 2018-11-09 MED ORDER — PREMIER PROTEIN SHAKE
11.0000 [oz_av] | Freq: Three times a day (TID) | ORAL | Status: DC
  Filled 2018-11-09 (×5): qty 325

## 2018-11-09 MED ORDER — SODIUM CHLORIDE 0.9% FLUSH
3.0000 mL | Freq: Two times a day (BID) | INTRAVENOUS | Status: DC
  Administered 2018-11-09 – 2018-11-10 (×3): 3 mL via INTRAVENOUS

## 2018-11-09 MED ORDER — FLUTICASONE PROPIONATE 50 MCG/ACT NA SUSP
1.0000 | Freq: Every day | NASAL | Status: DC
  Administered 2018-11-09: 1 via NASAL
  Administered 2018-11-10 – 2018-11-11 (×2): 2 via NASAL
  Administered 2018-11-12: 09:00:00 1 via NASAL
  Filled 2018-11-09 (×2): qty 16

## 2018-11-09 MED ORDER — SODIUM CHLORIDE 0.9% FLUSH: 3.0000 mL | INTRAVENOUS | Status: DC | PRN

## 2018-11-09 MED ORDER — VANCOMYCIN HCL 10 G IV SOLR: 1500.0000 mg | INTRAVENOUS | Status: DC

## 2018-11-09 MED ORDER — AMLODIPINE BESYLATE 5 MG PO TABS: 7.5000 mg | ORAL_TABLET | Freq: Every day | ORAL | Status: DC

## 2018-11-09 MED ORDER — FLUOXETINE HCL 20 MG PO CAPS
60.0000 mg | ORAL_CAPSULE | Freq: Every day | ORAL | Status: DC
  Administered 2018-11-10 – 2018-11-12 (×3): 60 mg via ORAL
  Filled 2018-11-09 (×3): qty 3

## 2018-11-09 MED ORDER — INSULIN ASPART 100 UNIT/ML ~~LOC~~ SOLN: 0.0000 [IU] | Freq: Every day | SUBCUTANEOUS | Status: DC

## 2018-11-09 MED ORDER — SODIUM CHLORIDE 0.9 % IV SOLN
INTRAVENOUS | Status: DC
  Administered 2018-11-09 – 2018-11-10 (×3): via INTRAVENOUS

## 2018-11-09 MED ORDER — SODIUM CHLORIDE 0.9 % IV SOLN: 2.0000 g | Freq: Two times a day (BID) | INTRAVENOUS | Status: DC

## 2018-11-09 MED ORDER — METFORMIN HCL ER 500 MG PO TB24: 1000.0000 mg | ORAL_TABLET | Freq: Two times a day (BID) | ORAL | Status: DC

## 2018-11-09 MED ORDER — SODIUM CHLORIDE 0.9 % IV BOLUS (SEPSIS)
1000.0000 mL | Freq: Once | INTRAVENOUS | Status: AC
  Administered 2018-11-09: 17:00:00 1000 mL via INTRAVENOUS

## 2018-11-09 MED ORDER — VANCOMYCIN HCL IN DEXTROSE 1-5 GM/200ML-% IV SOLN
1000.0000 mg | INTRAVENOUS | Status: AC
  Administered 2018-11-09: 17:00:00 1000 mg via INTRAVENOUS
  Filled 2018-11-09 (×2): qty 200

## 2018-11-09 MED ORDER — VANCOMYCIN HCL IN DEXTROSE 1-5 GM/200ML-% IV SOLN: 1000.0000 mg | Freq: Once | INTRAVENOUS | Status: DC

## 2018-11-09 MED ORDER — ADULT MULTIVITAMIN W/MINERALS CH
1.0000 | ORAL_TABLET | Freq: Every day | ORAL | Status: DC
  Administered 2018-11-09 – 2018-11-12 (×4): 1 via ORAL
  Filled 2018-11-09 (×4): qty 1

## 2018-11-09 MED ORDER — SODIUM CHLORIDE 0.9 % IV SOLN: 250.0000 mL | INTRAVENOUS | Status: DC | PRN

## 2018-11-09 MED ORDER — SODIUM CHLORIDE 0.9 % IV BOLUS
1000.0000 mL | Freq: Once | INTRAVENOUS | Status: AC
  Administered 2018-11-09: 23:00:00 1000 mL via INTRAVENOUS

## 2018-11-09 MED ORDER — ONDANSETRON HCL 4 MG/2ML IJ SOLN: 4.0000 mg | Freq: Four times a day (QID) | INTRAMUSCULAR | Status: DC | PRN

## 2018-11-09 MED ORDER — ACETAMINOPHEN 325 MG PO TABS
650.0000 mg | ORAL_TABLET | Freq: Four times a day (QID) | ORAL | Status: DC | PRN
  Administered 2018-11-09 – 2018-11-10 (×2): 650 mg via ORAL
  Filled 2018-11-09 (×2): qty 2

## 2018-11-09 MED ORDER — OMEGA-3-ACID ETHYL ESTERS 1 G PO CAPS
4.0000 | ORAL_CAPSULE | Freq: Every day | ORAL | Status: DC
  Administered 2018-11-09 – 2018-11-12 (×4): 4 g via ORAL
  Filled 2018-11-09 (×4): qty 4

## 2018-11-09 MED ORDER — METRONIDAZOLE IN NACL 5-0.79 MG/ML-% IV SOLN
500.0000 mg | Freq: Once | INTRAVENOUS | Status: AC
  Administered 2018-11-09: 16:00:00 500 mg via INTRAVENOUS
  Filled 2018-11-09: qty 100

## 2018-11-09 MED ORDER — PANTOPRAZOLE SODIUM 40 MG PO TBEC
40.0000 mg | DELAYED_RELEASE_TABLET | Freq: Every day | ORAL | Status: DC
  Administered 2018-11-09 – 2018-11-12 (×4): 40 mg via ORAL
  Filled 2018-11-09 (×4): qty 1

## 2018-11-09 MED ORDER — SODIUM CHLORIDE 0.9 % IV BOLUS
1000.0000 mL | Freq: Once | INTRAVENOUS | Status: AC
  Administered 2018-11-09: 1000 mL via INTRAVENOUS

## 2018-11-09 MED ORDER — EZETIMIBE 10 MG PO TABS
10.0000 mg | ORAL_TABLET | Freq: Every day | ORAL | Status: DC
  Administered 2018-11-10 – 2018-11-12 (×3): 10 mg via ORAL
  Filled 2018-11-09 (×3): qty 1

## 2018-11-09 MED ORDER — LISINOPRIL 5 MG PO TABS: 2.5000 mg | ORAL_TABLET | Freq: Two times a day (BID) | ORAL | Status: DC

## 2018-11-09 MED ORDER — SODIUM CHLORIDE 0.9 % IV BOLUS: 500.0000 mL | Freq: Once | INTRAVENOUS | Status: DC

## 2018-11-09 MED ORDER — OLOPATADINE HCL 0.1 % OP SOLN
1.0000 [drp] | Freq: Two times a day (BID) | OPHTHALMIC | Status: DC
  Administered 2018-11-10 – 2018-11-12 (×5): 1 [drp] via OPHTHALMIC
  Filled 2018-11-09: qty 5

## 2018-11-09 MED ORDER — FINASTERIDE 1 MG PO TABS
1.0000 mg | ORAL_TABLET | Freq: Every day | ORAL | Status: DC
  Filled 2018-11-09 (×2): qty 1

## 2018-11-09 MED ORDER — TRAZODONE HCL 50 MG PO TABS
50.0000 mg | ORAL_TABLET | Freq: Every evening | ORAL | Status: DC | PRN
  Administered 2018-11-09 – 2018-11-11 (×2): 50 mg via ORAL
  Filled 2018-11-09 (×2): qty 1

## 2018-11-09 MED ORDER — ONDANSETRON HCL 4 MG PO TABS: 4.0000 mg | ORAL_TABLET | Freq: Four times a day (QID) | ORAL | Status: DC | PRN

## 2018-11-09 MED ORDER — SODIUM CHLORIDE 0.9 % IV SOLN
2.0000 g | Freq: Once | INTRAVENOUS | Status: AC
  Administered 2018-11-09: 15:00:00 2 g via INTRAVENOUS
  Filled 2018-11-09: qty 2

## 2018-11-09 MED ORDER — INSULIN ASPART 100 UNIT/ML ~~LOC~~ SOLN
0.0000 [IU] | Freq: Three times a day (TID) | SUBCUTANEOUS | Status: DC
  Administered 2018-11-10 – 2018-11-11 (×3): 2 [IU] via SUBCUTANEOUS
  Administered 2018-11-11: 09:00:00 1 [IU] via SUBCUTANEOUS
  Administered 2018-11-12: 2 [IU] via SUBCUTANEOUS

## 2018-11-09 MED ORDER — ASPIRIN EC 81 MG PO TBEC
81.0000 mg | DELAYED_RELEASE_TABLET | Freq: Every day | ORAL | Status: DC
  Administered 2018-11-10 – 2018-11-12 (×3): 81 mg via ORAL
  Filled 2018-11-09 (×6): qty 1

## 2018-11-09 MED ORDER — SODIUM CHLORIDE 0.9 % IV BOLUS
500.0000 mL | Freq: Once | INTRAVENOUS | Status: AC
  Administered 2018-11-09: 13:00:00 500 mL via INTRAVENOUS

## 2018-11-09 MED ORDER — SODIUM CHLORIDE 0.9 % IV BOLUS (SEPSIS)
1000.0000 mL | Freq: Once | INTRAVENOUS | Status: AC
  Administered 2018-11-09: 15:00:00 1000 mL via INTRAVENOUS

## 2018-11-09 NOTE — ED Provider Notes (Signed)
[facility] EMERGENCY DEPARTMENT Provider Note   CSN: [id number] Arrival date & time: 11/09/18  1220     History   Chief Complaint Chief Complaint  Patient presents with   Chills    HPI Melvin [name]. is a 68 y.o. male.     68 year old male presents with complaint of vomiting.  Patient states that he ate out at a restaurant last night, shortly after coming home developed sudden onset of chills followed by nausea and vomiting.  Patient felt very cold, had not checked his temperature potentially under multiple blankets in his bed to stay warm.  Patient continued to vomit throughout the night, called EMS who evaluated patient, unsure if he had a fever as he had inconsistent temperature readings and offered to transport him to [place].  Patient wanted to come to any pain so he declined transport last night and decided to come today to be checked out.  Patient reports one additional episode of nonbloody nonbilious emesis this morning, states that his stools have been slightly loose today, nonbloody.  Reports mild abdominal ache, no specific pain.  Denies shortness of breath, chest pain, sick contacts, body aches, headaches, changes in bladder habits, loss of sense of smell or taste.  Patient came to the emergency room today to get tested for COVID, is feeling improved today compared to last evening.  Melvin [name]. was evaluated in Emergency Department on 11/09/2018 for the symptoms described in the history of present illness. He was evaluated in the context of the global COVID-19 pandemic, which necessitated consideration that the patient might be at risk for infection with the SARS-CoV-2 virus that causes COVID-19. Institutional protocols and algorithms that pertain to the evaluation of patients at risk for COVID-19 are in a state of rapid change based on information released by regulatory bodies including the CDC and federal and state organizations. These policies and algorithms were followed  during the patient's care in the ED.      Past Medical History:  Diagnosis Date   Acute blood loss anemia    BCC (basal cell carcinoma), face    Degeneration of intervertebral disc, site unspecified    Depressive disorder, not elsewhere classified    Esophageal stricture    Essential hypertension, benign    GERD (gastroesophageal reflux disease)    Hiatal hernia    Hyperplasia of prostate    Osteoarthrosis and allied disorders    Postoperative leak    Sleep apnea    Trauma 06/27/2017   UTI due to Klebsiella species     Patient Active Problem List   Diagnosis Date Noted   UTI (urinary tract infection) 11/09/2018   Vitamin D deficiency 10/09/2018   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, type I or II, with nonunion, subsequent encounter 01/25/2018   Multiple closed fractures of pelvis with unstable disruption of pelvic ring ([place]) 09/18/2017   Obstructive sleep apnea on CPAP 08/24/2017   BPH (benign prostatic hyperplasia) 08/24/2017   AKI (acute kidney injury) ([place]) 08/24/2017   Hypomagnesemia 08/24/2017   Hypoalbuminemia due to protein-calorie malnutrition ([place]) 08/24/2017   Type I or II open fracture of left radius with routine healing 07/13/2017   Slow transit constipation    Labile blood pressure    Dry eyes    History of multiple trauma    Sleep disturbance    History of pulmonary embolism 06/27/2017   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 06/27/2017   Peri-prosthetic  femur fracture at tip of prosthesis 06/27/2017   Seasonal and perennial allergic rhinitis 02/17/2016   T2DM (type 2 diabetes mellitus) ([place]) 02/26/2015   Erectile dysfunction 04/15/2013   Statin intolerance 04/15/2013   Hyperlipidemia associated with type 2 diabetes mellitus ([place]) 04/15/2013   Hypertension associated with type 2 diabetes mellitus ([place]) 10/18/2012   BCC (basal cell carcinoma), face    Hiatal hernia with  gastroesophageal reflux     Degeneration of intervertebral disc, site unspecified    Esophageal stricture, history of    Depression, recurrent ([employer])    Osteoarthritis     Past Surgical History:  Procedure Laterality Date   APPENDECTOMY  11-1992   JOINT REPLACEMENT     Left Hip REplaacement  01-1999   LUMBAR FUSION  05-1998   NASAL SEPTUM SURGERY  1989   Right Hip Replacement  09-2002   SPINE SURGERY     VENTRAL HERNIA REPAIR  02-2000        Home Medications    Prior to Admission medications   Medication Sig Start Date End Date Taking? Authorizing Provider  amLODipine (NORVASC) 5 MG tablet Take 1.5 tablets (7.5 mg total) by mouth daily. 10/09/18 01/07/19 Yes Rakes, Connye Burkitt, FNP  Ascorbic Acid (VITAMIN C) 1000 MG tablet Take 1,000 mg by mouth daily.   Yes [provider]  aspirin 81 MG EC tablet Take 1 tablet (81 mg total) by mouth daily. 07/10/17  Yes Love, Ivan Anchors, PA-C  ezetimibe (ZETIA) 10 MG tablet Take 1 tablet (10 mg total) by mouth daily. (Please make appt w/ new PCP) 10/09/18  Yes Rakes, Connye Burkitt, FNP  finasteride (PROPECIA) 1 MG tablet TAKE ONE (1) TABLET EACH DAY 10/09/18  Yes Rakes, Connye Burkitt, FNP  FLUoxetine (PROZAC) 20 MG capsule Take 3 capsules (60 mg total) by mouth daily. (Please make appt w/ new PCP) 10/09/18 01/07/19 Yes Rakes, Connye Burkitt, FNP  fluticasone [facility]) 50 MCG/ACT nasal spray USE 1 TO 2 SPRAYS IN EACH NOSTRIL DAILY 10/24/18  Yes Rakes, Connye Burkitt, FNP  gabapentin (NEURONTIN) 100 MG capsule Take 3 capsules (300 mg total) by mouth 3 (three) times daily. 10/09/18 01/07/19 Yes Rakes, Connye Burkitt, FNP  ibuprofen (ADVIL,MOTRIN) 800 MG tablet Take 1 tablet (800 mg total) by mouth every 8 (eight) hours as needed. 04/19/18  Yes Chipper Herb, MD  lisinopril (ZESTRIL) 2.5 MG tablet Take 1 tablet (2.5 mg total) by mouth 2 (two) times daily. (Needs to be seen before next refill) 10/09/18  Yes Rakes, Connye Burkitt, FNP  Melatonin 3 MG TABS Take 1 tablet (3 mg total) by mouth at  bedtime. 07/10/17  Yes Love, Ivan Anchors, PA-C  metFORMIN (GLUCOPHAGE-XR) 500 MG 24 hr tablet Take 1,000 mg by mouth 2 (two) times daily with a meal. 10/08/18  Yes [provider]  Misc Natural Products (OSTEO BI-FLEX JOINT SHIELD PO) Take 2 tablets by mouth daily.   Yes [provider]  Multiple Vitamin (MULTIVITAMIN WITH MINERALS) TABS tablet Take 1 tablet by mouth daily. 07/10/17  Yes Love, Ivan Anchors, PA-C  Olopatadine HCl 0.2 % SOLN Place 1 drop into both eyes daily.   Yes [provider]  omega-3 acid ethyl esters (LOVAZA) 1 g capsule TAKE 4 CAPSULES DAILY 01/14/18  Yes Chipper Herb, MD  pantoprazole (PROTONIX) 40 MG tablet Take 1 tablet (40 mg total) by mouth daily. 10/09/18  Yes Baruch Gouty, FNP  PAZEO 0.7 % SOLN  09/25/18  Yes [provider]  protein  supplement shake (PREMIER PROTEIN) LIQD Take 325 mLs (11 oz total) by mouth 3 (three) times daily with meals. 07/10/17  Yes Love, Ivan Anchors, PA-C    Family History Family History  Problem Relation Age of Onset   Lymphoma Mother    Arthritis Mother    Asthma Mother    Kidney failure Father    Hypertension Father    Hypertension Brother    Hyperlipidemia Brother    Bipolar disorder Brother     Social History Social History   Tobacco Use   Smoking status: Former Smoker    Types: Cigars   Smokeless tobacco: Never Used  Substance Use Topics   Alcohol use: Not Currently    Alcohol/week: 3.0 standard drinks    Types: 3 Glasses of wine per week    Comment: drinks half a bottle at nights occasionally   Drug use: No     Allergies   Ativan [lorazepam], Dilaudid [hydromorphone], Morphine, Seroquel [quetiapine], and Statins   Review of Systems Review of Systems  Constitutional: Positive for chills, diaphoresis and fever.  HENT: Negative for congestion.   Respiratory: Negative for cough and shortness of breath.   Cardiovascular: Negative for chest pain.  Gastrointestinal: Positive for  abdominal pain, diarrhea, nausea and vomiting. Negative for abdominal distention, blood in stool and constipation.  Genitourinary: Negative for decreased urine volume, difficulty urinating, dysuria and frequency.  Musculoskeletal: Negative for arthralgias and myalgias.  Skin: Negative for rash and wound.  Allergic/Immunologic: Positive for immunocompromised state.  Neurological: Negative for dizziness and weakness.  Hematological: Negative for adenopathy.  Psychiatric/Behavioral: Negative for confusion.  All other systems reviewed and are negative.    Physical Exam Updated Vital Signs BP 112/64    Pulse 75    Temp 98.2 F (36.8 C) (Oral)    Resp 19    Ht 6' (1.829 m)    Wt 87.5 kg    SpO2 97%    BMI 26.18 kg/m   Physical Exam Vitals signs and nursing note reviewed.  Constitutional:      General: He is not in acute distress.    Appearance: He is well-developed. He is not diaphoretic.  HENT:     Head: Normocephalic and atraumatic.  Cardiovascular:     Rate and Rhythm: Normal rate and regular rhythm.     Pulses: Normal pulses.     Heart sounds: Normal heart sounds.  Pulmonary:     Effort: Pulmonary effort is normal.     Breath sounds: Normal breath sounds.  Abdominal:     General: There is no distension.     Palpations: Abdomen is soft.     Tenderness: There is no abdominal tenderness. There is no guarding or rebound.  Musculoskeletal:     Right lower leg: No edema.     Left lower leg: No edema.  Skin:    General: Skin is warm and dry.     Findings: No erythema or rash.  Neurological:     Mental Status: He is alert and oriented to person, place, and time.  Psychiatric:        Behavior: Behavior normal.      ED Treatments / Results  Labs (all labs ordered are listed, but only abnormal results are displayed) Labs Reviewed  CBC WITH DIFFERENTIAL/PLATELET - Abnormal; Notable for the following components:      Result Value   WBC 18.0 (*)    Platelets 120 (*)     Neutro Abs 16.7 (*)  Lymphs Abs 0.1 (*)    Abs Immature Granulocytes 0.15 (*)    All other components within normal limits  COMPREHENSIVE METABOLIC PANEL - Abnormal; Notable for the following components:   Sodium 133 (*)    CO2 21 (*)    Glucose, Bld 227 (*)    BUN 39 (*)    Creatinine, Ser 1.37 (*)    Calcium 8.3 (*)    Total Protein 6.3 (*)    Total Bilirubin 0.2 (*)    GFR calc non Af Amer 52 (*)    All other components within normal limits  URINALYSIS, ROUTINE W REFLEX MICROSCOPIC - Abnormal; Notable for the following components:   APPearance HAZY (*)    Leukocytes,Ua TRACE (*)    Bacteria, UA MANY (*)    All other components within normal limits  LACTIC ACID, PLASMA - Abnormal; Notable for the following components:   Lactic Acid, Venous 2.8 (*)    All other components within normal limits  LACTIC ACID, PLASMA - Abnormal; Notable for the following components:   Lactic Acid, Venous 2.9 (*)    All other components within normal limits  CULTURE, BLOOD (ROUTINE X 2)  CULTURE, BLOOD (ROUTINE X 2)  SARS CORONAVIRUS 2 (HOSPITAL ORDER, [place] [facility])  URINE CULTURE  LIPASE, BLOOD  APTT  PROTIME-INR  TROPONIN I (HIGH SENSITIVITY)  TROPONIN I (HIGH SENSITIVITY)    EKG None  Radiology Dg Chest 2 View  Result Date: 11/09/2018 CLINICAL DATA:  Weakness with chills last night and vomiting x2. EXAM: CHEST - 2 VIEW COMPARISON:  04/15/2013 FINDINGS: Lungs are adequately inflated. There is no focal airspace consolidation or effusion. Cardiomediastinal silhouette and remainder of the exam is unchanged to include partial fusion of 2 lower thoracic vertebral bodies. IMPRESSION: No active cardiopulmonary disease. Electronically Signed   By: Marin Olp M.D.   On: 11/09/2018 14:11    Procedures .Critical Care Performed by: Tacy Learn, PA-C Authorized by: Tacy Learn, PA-C   Critical care provider statement:    Critical care time (minutes):  45    Critical care was necessary to treat or prevent imminent or life-threatening deterioration of the following conditions:  Sepsis   Critical care was time spent personally by me on the following activities:  Discussions with consultants, evaluation of patient's response to treatment, examination of patient, ordering and performing treatments and interventions, ordering and review of laboratory studies, ordering and review of radiographic studies, pulse oximetry, re-evaluation of patient's condition, obtaining history from patient or surrogate and review of old charts   (including critical care time)  Medications Ordered in ED Medications  sodium chloride 0.9 % bolus 1,000 mL (0 mLs Intravenous Stopped 11/09/18 1533)    And  sodium chloride 0.9 % bolus 1,000 mL (0 mLs Intravenous Stopped 11/09/18 1637)    And  sodium chloride 0.9 % bolus 1,000 mL (1,000 mLs Intravenous New Bag/Given 11/09/18 1637)  ceFEPIme (MAXIPIME) 2 g in sodium chloride 0.9 % 100 mL IVPB (has no administration in time range)  vancomycin (VANCOCIN) IVPB 1000 mg/200 mL premix (1,000 mg Intravenous New Bag/Given 11/09/18 1642)  vancomycin (VANCOCIN) 1,500 mg in sodium chloride 0.9 % 500 mL IVPB (has no administration in time range)  sodium chloride 0.9 % bolus 500 mL (0 mLs Intravenous Stopped 11/09/18 1432)  ceFEPIme (MAXIPIME) 2 g in sodium chloride 0.9 % 100 mL IVPB (0 g Intravenous Stopped 11/09/18 1533)  metroNIDAZOLE (FLAGYL) IVPB 500 mg (0 mg Intravenous Stopped  11/09/18 1637)     Initial Impression / Assessment and Plan / ED Course  I have reviewed the triage vital signs and the nursing notes.  Pertinent labs & imaging results that were available during my care of the patient were reviewed by me and considered in my medical decision making (see chart for details).  Clinical Course as of Nov 08 1649  Sat Nov 09, 4719  1557 68 year old male with past medical history of non-insulin-dependent diabetes and hypertension presents  with complaint of nausea, vomiting, possible fevers, chills, sweats onset last night with loose nonbloody stools (does not feel that he is having diarrhea).  Patient woke up this morning, had another episode of emesis and came to the emergency room for a COVID test, felt like he was feeling better today than he was last night.  On exam, patient is well-appearing, abdomen is soft and nontender, lung sounds are clear.  Labs ordered to evaluate for hydration status, infection concerns.  Chest x-ray is unremarkable, troponin x2-, COVID test negative.  CBC with leukocytosis of 18,000, CMP with mildly elevated creatinine at 1.37. Initial Lactic acid 2.8. Patient was started on sespsis protocol, given broad spectrum antibiotics and IV fluids. Repeat lactic 2.9 (however, had not completed fluid bolus). UA with trace leukocytes, many bacteria and 6-10 WBC. On recheck, patient feeling better, abdomen remains soft and non tender. Initial BP 103/59, 110/70s. Patient was seen by Dr. Roderic Palau, ER attending, who has seen the patient and agrees with plan for admission. Patient and wife at bedside agreeable with plan.    [LM]  1650 Case discussed with Dr. Denton Brick, hospitalist, will consult for admission.   [LM]    Clinical Course User Index [LM] Tacy Learn, PA-C      Final Clinical Impressions(s) / ED Diagnoses   Final diagnoses:  Sepsis without acute organ dysfunction, due to unspecified organism [facility])    ED Discharge Orders    None       Tacy Learn, PA-C 11/09/18 1651    Milton Ferguson, MD 11/10/18 [phone]

## 2018-11-09 NOTE — H&P (Signed)
Patient Demographics:    Melvin [name], is a 68 y.o. male  MRN: [id number]   DOB - 06-30-1950  Admit Date - 11/09/2018  Outpatient Primary MD for the patient is Rakes, Connye Burkitt, FNP   Assessment & Plan:    Principal Problem:   Sepsis secondary to UTI [facility]) Active Problems:   AKI (acute kidney injury) ([place])   UTI (urinary tract infection)   Lactic acidosis   Obstructive sleep apnea on CPAP   Depression, recurrent (HCC)   BPH (benign prostatic hyperplasia)   T2DM (type 2 diabetes mellitus) ([place])   HTN (hypertension)    1)Presumed sepsis secondary to urinary source-patient had fevers at home presenting with leukocytosis (WBC 18) and lactic acidosis with finding of UTI-- -patient received IV cefepime, vancomycin and Flagyl in the ED,  -will treat empirically with IV Rocephin pending urine and blood culture results -IV fluids per sepsis protocol, serial lactic acid levels -Patient PTA was on metformin which probably worsened his lactic acidosis  2)AKI----acute kidney injury-worsening renal function needs due to dehydration and sepsis secondary to UTI compounded by lisinopril and metformin use - creatinine on admission=1.37 , baseline creatinine =0.6    , renally adjust medications, avoid nephrotoxic agents / dehydration / hypotension  3)DM2-A1c was 5.1 about 6 months ago, reflecting excellent diabetic control.  Stop metformin especially due to AKI and lactic acidosis -Upon discharge patient may need much lower dose of metformin, PTA patient was on 1000 mg of extended release metformin twice a day -Use Novolog/Humalog Sliding scale insulin with Accu-Cheks/Fingersticks as ordered   4)HTN--stable, hold lisinopril due to acute kidney injury. -Discontinue amlodipine due to soft BP in the setting of sepsis  may use IV  labetalol when necessary  Every 4 hours for systolic blood pressure over 160 mmhg  5) depressive disorder--stable, continue Prozac  6)BPH with LUTs--- continue finasteride,   7) thrombocytopenia--platelets down to 120 K from a baseline between 170 to 200 K--- ??  Secondary to sepsis, -please monitor monitor closely -Please discontinue subcu heparin for DVT prophylaxis if platelet count drops below 100 K   With History of - Reviewed by me  Past Medical History:  Diagnosis Date  . Acute blood loss anemia   . BCC (basal cell carcinoma), face   . Degeneration of intervertebral disc, site unspecified   . Depressive disorder, not elsewhere classified   . Esophageal stricture   . Essential hypertension, benign   . GERD (gastroesophageal reflux disease)   . Hiatal hernia   . Hyperplasia of prostate   . Osteoarthrosis and allied disorders   . Postoperative leak   . Sleep apnea   . Trauma 06/27/2017  . UTI due to Klebsiella species       Past Surgical History:  Procedure Laterality Date  . APPENDECTOMY  [id number]  . JOINT REPLACEMENT    . Left Hip REplaacement  01-1999  . LUMBAR FUSION  05-1998  . NASAL SEPTUM  SURGERY  1989  . Right Hip Replacement  09-2002  . SPINE SURGERY    . VENTRAL HERNIA REPAIR  02-2000      Chief Complaint  Patient presents with  . Chills      HPI:    Melvin [name]  is a 68 y.o. male with past medical history relevant for DM2, HTN, HLD, and depressive disorder and possible OSA as well as BPH who presents to the ED with fevers chills Rigors. Additional history obtained from patient's wife at bedside -Denies hematuria or frank dysuria he did have some urinary frequency, -He had nausea and multiple episodes of vomiting and one episode of loose stool -Emesis was without bile or blood, stool was without blood or mucus -No chest pains no palpitations, no dyspnea -Per patient's wife overnight patient was shivering and diaphoretic with presumed fevers -He had  some abdominal discomfort however this is resolved and his abdominal exam in the ED is unremarkable  In ED--WBC is 18 K, creatinine is up to 1.37 from a baseline of 0.6, initial lactic acid is 2.8 up to two-point 9 repeat.  Platelets down to 120 from a baseline close to 200 -Lipase is not elevated, troponin is not elevated -Chest x-ray without acute findings -COVID-19 negative -UA suspicious for UTI blood and urine cultures sent    Review of systems:    In addition to the HPI above,   A full Review of  Systems was done, all other systems reviewed are negative except as noted above in HPI , .    Social History:  Reviewed by me    Social History   Tobacco Use  . Smoking status: Former Smoker    Types: Cigars  . Smokeless tobacco: Never Used  Substance Use Topics  . Alcohol use: Not Currently    Alcohol/week: 3.0 standard drinks    Types: 3 Glasses of wine per week    Comment: drinks half a bottle at nights occasionally       Family History :  Reviewed by me    Family History  Problem Relation Age of Onset  . Lymphoma Mother   . Arthritis Mother   . Asthma Mother   . Kidney failure Father   . Hypertension Father   . Hypertension Brother   . Hyperlipidemia Brother   . Bipolar disorder Brother      Home Medications:   Prior to Admission medications   Medication Sig Start Date End Date Taking? Authorizing Provider  amLODipine (NORVASC) 5 MG tablet Take 1.5 tablets (7.5 mg total) by mouth daily. 10/09/18 01/07/19 Yes Rakes, Connye Burkitt, FNP  Ascorbic Acid (VITAMIN C) 1000 MG tablet Take 1,000 mg by mouth daily.   Yes [provider]  aspirin 81 MG EC tablet Take 1 tablet (81 mg total) by mouth daily. 07/10/17  Yes Love, Ivan Anchors, PA-C  ezetimibe (ZETIA) 10 MG tablet Take 1 tablet (10 mg total) by mouth daily. (Please make appt w/ new PCP) 10/09/18  Yes Rakes, Connye Burkitt, FNP  finasteride (PROPECIA) 1 MG tablet TAKE ONE (1) TABLET EACH DAY 10/09/18  Yes Rakes, Connye Burkitt, FNP   FLUoxetine (PROZAC) 20 MG capsule Take 3 capsules (60 mg total) by mouth daily. (Please make appt w/ new PCP) 10/09/18 01/07/19 Yes Rakes, Connye Burkitt, FNP  fluticasone [facility]) 50 MCG/ACT nasal spray USE 1 TO 2 SPRAYS IN EACH NOSTRIL DAILY 10/24/18  Yes Rakes, Connye Burkitt, FNP  gabapentin (NEURONTIN) 100 MG capsule Take 3 capsules (300  mg total) by mouth 3 (three) times daily. 10/09/18 01/07/19 Yes Rakes, Connye Burkitt, FNP  ibuprofen (ADVIL,MOTRIN) 800 MG tablet Take 1 tablet (800 mg total) by mouth every 8 (eight) hours as needed. 04/19/18  Yes Chipper Herb, MD  lisinopril (ZESTRIL) 2.5 MG tablet Take 1 tablet (2.5 mg total) by mouth 2 (two) times daily. (Needs to be seen before next refill) 10/09/18  Yes Rakes, Connye Burkitt, FNP  Melatonin 3 MG TABS Take 1 tablet (3 mg total) by mouth at bedtime. 07/10/17  Yes Love, Ivan Anchors, PA-C  metFORMIN (GLUCOPHAGE-XR) 500 MG 24 hr tablet Take 1,000 mg by mouth 2 (two) times daily with a meal. 10/08/18  Yes [provider]  Misc Natural Products (OSTEO BI-FLEX JOINT SHIELD PO) Take 2 tablets by mouth daily.   Yes [provider]  Multiple Vitamin (MULTIVITAMIN WITH MINERALS) TABS tablet Take 1 tablet by mouth daily. 07/10/17  Yes Love, Ivan Anchors, PA-C  Olopatadine HCl 0.2 % SOLN Place 1 drop into both eyes daily.   Yes [provider]  omega-3 acid ethyl esters (LOVAZA) 1 g capsule TAKE 4 CAPSULES DAILY 01/14/18  Yes Chipper Herb, MD  pantoprazole (PROTONIX) 40 MG tablet Take 1 tablet (40 mg total) by mouth daily. 10/09/18  Yes Baruch Gouty, FNP  PAZEO 0.7 % SOLN  09/25/18  Yes [provider]  protein supplement shake (PREMIER PROTEIN) LIQD Take 325 mLs (11 oz total) by mouth 3 (three) times daily with meals. 07/10/17  Yes Love, Ivan Anchors, PA-C     Allergies:     Allergies  Allergen Reactions  . Ativan [Lorazepam] Other (See Comments)    Confusion & agitation  . Dilaudid [Hydromorphone] Other (See Comments)    confusion  . Morphine Other (See  Comments)    Headaches  . Seroquel [Quetiapine] Other (See Comments)    Confusion/agitation  . Statins Other (See Comments)    Muscle aches and myalgias with Lipitor,Zocor,Vytorin,Pravachol,Crestor, and Livalo even at low doses Muscle aches and myalgias with Lipitor,Zocor,Vytorin,Pravachol,Crestor, and Livalo even at low doses     Physical Exam:   Vitals  Blood pressure (!) 113/59, pulse 77, temperature 98.2 F (36.8 C), temperature source Oral, resp. rate 19, height 6' (1.829 m), weight 87.5 kg, SpO2 96 %.  Physical Examination: General appearance - alert, well appearing, and in no distress (patient does not look toxic) Mental status - alert, oriented to person, place, and time,  Eyes - sclera anicteric Neck - supple, no JVD elevation , Chest - clear  to auscultation bilaterally, symmetrical air movement,  Heart - S1 and S2 normal, regular  Abdomen - soft,  nondistended, some suprapubic area discomfort,  no CVA area tenderness Neurological - screening mental status exam normal, neck supple without rigidity, cranial nerves II through XII intact, DTR's normal and symmetric Extremities - no pedal edema noted, intact peripheral pulses  Skin - warm, dry     Data Review:    CBC Recent Labs  Lab 11/09/18 1316  WBC 18.0*  HGB 13.5  HCT 41.2  PLT 120*  MCV 91.4  MCH 29.9  MCHC 32.8  RDW 14.2  LYMPHSABS 0.1*  MONOABS 1.0  EOSABS 0.0  BASOSABS 0.0   ------------------------------------------------------------------------------------------------------------------  Chemistries  Recent Labs  Lab 11/09/18 1316  NA 133*  K 3.6  CL 102  CO2 21*  GLUCOSE 227*  BUN 39*  CREATININE 1.37*  CALCIUM 8.3*  AST 29  ALT 28  ALKPHOS 42  BILITOT 0.2*   ------------------------------------------------------------------------------------------------------------------ estimated creatinine clearance is 55.9 mL/min (A) (by C-G formula based on SCr of 1.37 mg/dL (H)).  ------------------------------------------------------------------------------------------------------------------ No results for input(s): TSH, T4TOTAL, T3FREE, THYROIDAB in the last 72 hours.  Invalid input(s): FREET3   Coagulation profile Recent Labs  Lab 11/09/18 1433  INR 1.2   ------------------------------------------------------------------------------------------------------------------- No results for input(s): DDIMER in the last 72 hours. -------------------------------------------------------------------------------------------------------------------  Cardiac Enzymes No results for input(s): CKMB, TROPONINI, MYOGLOBIN in the last 168 hours.  Invalid input(s): CK ------------------------------------------------------------------------------------------------------------------ No results found for: BNP   ---------------------------------------------------------------------------------------------------------------  Urinalysis    Component Value Date/Time   COLORURINE YELLOW 11/09/2018 1500   APPEARANCEUR HAZY (A) 11/09/2018 1500   APPEARANCEUR Clear 03/03/2016 1703   LABSPEC 1.016 11/09/2018 1500   PHURINE 5.0 11/09/2018 1500   GLUCOSEU NEGATIVE 11/09/2018 1500   HGBUR NEGATIVE 11/09/2018 1500   BILIRUBINUR NEGATIVE 11/09/2018 1500   BILIRUBINUR Negative 03/03/2016 1703   KETONESUR NEGATIVE 11/09/2018 1500   PROTEINUR NEGATIVE 11/09/2018 1500   UROBILINOGEN negative 05/14/2014 1409   NITRITE NEGATIVE 11/09/2018 1500   LEUKOCYTESUR TRACE (A) 11/09/2018 1500    ----------------------------------------------------------------------------------------------------------------   Imaging Results:    Dg Chest 2 View  Result Date: 11/09/2018 CLINICAL DATA:  Weakness with chills last night and vomiting x2. EXAM: CHEST - 2 VIEW COMPARISON:  04/15/2013 FINDINGS: Lungs are adequately inflated. There is no focal airspace consolidation or effusion. Cardiomediastinal  silhouette and remainder of the exam is unchanged to include partial fusion of 2 lower thoracic vertebral bodies. IMPRESSION: No active cardiopulmonary disease. Electronically Signed   By: Marin Olp M.D.   On: 11/09/2018 14:11    Radiological Exams on Admission: Dg Chest 2 View  Result Date: 11/09/2018 CLINICAL DATA:  Weakness with chills last night and vomiting x2. EXAM: CHEST - 2 VIEW COMPARISON:  04/15/2013 FINDINGS: Lungs are adequately inflated. There is no focal airspace consolidation or effusion. Cardiomediastinal silhouette and remainder of the exam is unchanged to include partial fusion of 2 lower thoracic vertebral bodies. IMPRESSION: No active cardiopulmonary disease. Electronically Signed   By: Marin Olp M.D.   On: 11/09/2018 14:11   DVT Prophylaxis -SCD/heparin AM Labs Ordered, also please review Full Orders  Family Communication: Admission, patients condition and plan of care including tests being ordered have been discussed with the patient and wife  who indicate understanding and agree with the plan   Code Status - Full Code  Likely DC to  home  Condition   stable  Roxan Hockey M.D on 11/09/2018 at 6:07 PM Go to [email or web address] -  for contact info  [facility] - Office  [phone]

## 2018-11-09 NOTE — ED Notes (Signed)
CRITICAL VALUE ALERT  Critical Value:  Lactic acid 2.8  Date & Time Notied: 1411   Provider Notified: [email or web address]  Orders Received/Actions taken:

## 2018-11-09 NOTE — ED Triage Notes (Signed)
Pt had chills last night and vomited twice. States he felt very weak. Pt feels weak this morning, but feels much better than he did last night. Ambulatory with a cane. NAD. Wants to be tested for COVID

## 2018-11-09 NOTE — Progress Notes (Signed)
Pharmacy Antibiotic Note  Melvin Tori. is a 68 y.o. male admitted on 11/09/2018 with infection- unknown source.  Pharmacy has been consulted for Vancomycin and Cefepime dosing.  Plan: Vancomycin 2000 mg IV x 1 dose. Vancomycin 1500 mg IV every 24 hours.  Goal trough 15-20 mcg/mL. Cefepime 2000 mg IV every 12 hours. Monitor labs, c/s, and vanco level as indicated.  Height: 6' (182.9 cm) Weight: 193 lb (87.5 kg) IBW/kg (Calculated) : 77.6  Temp (24hrs), Avg:98.2 F (36.8 C), Min:98.2 F (36.8 C), Max:98.2 F (36.8 C)  Recent Labs  Lab 11/09/18 1316  WBC 18.0*  CREATININE 1.37*  LATICACIDVEN 2.8*    Estimated Creatinine Clearance: 55.9 mL/min (A) (by C-G formula based on SCr of 1.37 mg/dL (H)).    Allergies  Allergen Reactions  . Ativan [Lorazepam] Other (See Comments)    Confusion & agitation  . Dilaudid [Hydromorphone] Other (See Comments)    confusion  . Morphine Other (See Comments)    Headaches  . Seroquel [Quetiapine] Other (See Comments)    Confusion/agitation  . Statins Other (See Comments)    Muscle aches and myalgias with Lipitor,Zocor,Vytorin,Pravachol,Crestor, and Livalo even at low doses Muscle aches and myalgias with Lipitor,Zocor,Vytorin,Pravachol,Crestor, and Livalo even at low doses    Antimicrobials this admission: Vanco 10/3 >>  Cefepime 10/3 >>  Flagyl 10/3 x 1   Dose adjustments this admission: Vanco/Cefepime  Microbiology results: 10/3 BCx: pending 10/3 UCx: pending     Thank you for allowing pharmacy to be a part of this patient's care.  Ramond Craver 11/09/2018 2:50 PM

## 2018-11-09 NOTE — ED Notes (Signed)
Date and time results received: 11/09/18 3:40 PM  (use smartphrase ".now" to insert current time)  Test: Lactic Acid Critical Value: 2.0  Name of Provider Notified: Suella Broad  Orders Received? Or Actions Taken?: Orders Received - See Orders for details

## 2018-11-10 ENCOUNTER — Encounter (HOSPITAL_COMMUNITY): Payer: Self-pay

## 2018-11-10 DIAGNOSIS — N400 Enlarged prostate without lower urinary tract symptoms: Secondary | ICD-10-CM

## 2018-11-10 DIAGNOSIS — E872 Acidosis: Secondary | ICD-10-CM

## 2018-11-10 DIAGNOSIS — E1141 Type 2 diabetes mellitus with diabetic mononeuropathy: Secondary | ICD-10-CM

## 2018-11-10 DIAGNOSIS — Z9989 Dependence on other enabling machines and devices: Secondary | ICD-10-CM

## 2018-11-10 DIAGNOSIS — G4733 Obstructive sleep apnea (adult) (pediatric): Secondary | ICD-10-CM

## 2018-11-10 DIAGNOSIS — I10 Essential (primary) hypertension: Secondary | ICD-10-CM

## 2018-11-10 DIAGNOSIS — N179 Acute kidney failure, unspecified: Secondary | ICD-10-CM

## 2018-11-10 LAB — BASIC METABOLIC PANEL
Anion gap: 6 (ref 5–15)
BUN: 32 mg/dL — ABNORMAL HIGH (ref 8–23)
CO2: 20 mmol/L — ABNORMAL LOW (ref 22–32)
Calcium: 7 mg/dL — ABNORMAL LOW (ref 8.9–10.3)
Chloride: 111 mmol/L (ref 98–111)
Creatinine, Ser: 0.85 mg/dL (ref 0.61–1.24)
GFR calc Af Amer: >60 mL/min (ref >60)
GFR calc non Af Amer: >60 mL/min (ref >60)
Glucose, Bld: 162 mg/dL — ABNORMAL HIGH (ref 70–99)
Potassium: 3.1 mmol/L — ABNORMAL LOW (ref 3.5–5.1)
Sodium: 137 mmol/L (ref 135–145)

## 2018-11-10 LAB — BLOOD CULTURE ID PANEL (REFLEXED)

## 2018-11-10 LAB — CBC
HCT: 34.2 % — ABNORMAL LOW (ref 39.0–52.0)
Hemoglobin: 11.1 g/dL — ABNORMAL LOW (ref 13.0–17.0)
MCH: 30.3 pg (ref 26.0–34.0)
MCHC: 32.5 g/dL (ref 30.0–36.0)
MCV: 93.4 fL (ref 80.0–100.0)
Platelets: 84 10*3/uL — ABNORMAL LOW (ref 150–400)
RBC: 3.66 MIL/uL — ABNORMAL LOW (ref 4.22–5.81)
RDW: 14.6 % (ref 11.5–15.5)
WBC: 16.2 10*3/uL — ABNORMAL HIGH (ref 4.0–10.5)
nRBC: 0 % (ref 0.0–0.2)

## 2018-11-10 LAB — HIV ANTIBODY (ROUTINE TESTING W REFLEX): HIV Screen 4th Generation wRfx: NONREACTIVE

## 2018-11-10 LAB — MAGNESIUM: Magnesium: 1.6 mg/dL — ABNORMAL LOW (ref 1.7–2.4)

## 2018-11-10 LAB — GLUCOSE, CAPILLARY
Glucose-Capillary: 112 mg/dL — ABNORMAL HIGH (ref 70–99)
Glucose-Capillary: 184 mg/dL — ABNORMAL HIGH (ref 70–99)
Glucose-Capillary: 170 mg/dL — ABNORMAL HIGH (ref 70–99)
Glucose-Capillary: 116 mg/dL — ABNORMAL HIGH (ref 70–99)

## 2018-11-10 LAB — LACTIC ACID, PLASMA: Lactic Acid, Venous: 1.2 mmol/L (ref 0.5–1.9)

## 2018-11-10 MED ORDER — POTASSIUM CHLORIDE CRYS ER 20 MEQ PO TBCR
40.0000 meq | EXTENDED_RELEASE_TABLET | Freq: Once | ORAL | Status: AC
  Administered 2018-11-10: 10:00:00 40 meq via ORAL
  Filled 2018-11-10: qty 2

## 2018-11-10 MED ORDER — MAGNESIUM SULFATE 2 GM/50ML IV SOLN
2.0000 g | Freq: Once | INTRAVENOUS | Status: AC
  Administered 2018-11-10: 15:00:00 2 g via INTRAVENOUS
  Filled 2018-11-10: qty 50

## 2018-11-10 MED ORDER — INFLUENZA VAC A&B SA ADJ QUAD 0.5 ML IM PRSY
0.5000 mL | PREFILLED_SYRINGE | INTRAMUSCULAR | Status: AC
  Administered 2018-11-11: 0.5 mL via INTRAMUSCULAR
  Filled 2018-11-10: qty 0.5

## 2018-11-10 MED ORDER — IBUPROFEN 800 MG PO TABS
800.0000 mg | ORAL_TABLET | Freq: Three times a day (TID) | ORAL | Status: DC | PRN
  Administered 2018-11-10 – 2018-11-12 (×5): 800 mg via ORAL
  Filled 2018-11-10 (×5): qty 1

## 2018-11-10 MED ORDER — FINASTERIDE 5 MG PO TABS
5.0000 mg | ORAL_TABLET | Freq: Every day | ORAL | Status: DC
  Administered 2018-11-10 – 2018-11-12 (×3): 5 mg via ORAL
  Filled 2018-11-10 (×3): qty 1

## 2018-11-10 MED ORDER — ENSURE MAX PROTEIN PO LIQD
11.0000 [oz_av] | Freq: Three times a day (TID) | ORAL | Status: DC
  Administered 2018-11-10 – 2018-11-12 (×3): 11 [oz_av] via ORAL

## 2018-11-10 NOTE — Progress Notes (Signed)
PROGRESS NOTE    Melvin [name].  AL:[id number]  DOB: 24-Apr-1950  DOA: 11/09/2018 PCP: Baruch Gouty, FNP   Brief Admission Hx: 68 y.o. male with past medical history relevant for DM2, HTN, HLD, and depressive disorder and possible OSA as well as BPH who presents to the ED with fevers, chills, and rigors.  MDM/Assessment & Plan:   1. Sepsis secondary to Klebsiella bacteremia-continue IV ceftriaxone 2 g every 24 hours.  Continue supportive therapy and monitor closely. 2. Lactic acidosis-secondary to sepsis, resolved with IV fluid hydration. 3. AKI-secondary to severe dehydration and ACE inhibitor use.  Treating with IV fluid hydration and following closely. 4. Type 2 diabetes mellitus- he has been transitioned to diet control of the disease given his low A1c of 5.1%.  Holding metformin at this time.  Sliding scale coverage as needed for elevated blood glucose readings. 5. Essential hypertension-he is having soft blood pressures and we are holding his home lisinopril and amlodipine. 6. Depression-continue home Prozac. 7. BPH-continue home finish steroid. 8. Thrombocytopenia-further drop in platelets prompted discontinuing subcu heparin.  SCDs ordered for DVT prophylaxis.  Follow CBC in a.m.  DVT prophylaxis: SCDs Code Status: Full Family Communication: Patient updated at bedside, verbalized understanding Disposition Plan: Continue inpatient treatment for IV fluids and IV antibiotics   Consultants:    Procedures:    Antimicrobials:  Ceftriaxone 10/4 >>   Subjective: Pt says he is tired but he seems to be feeling a little better today.   Objective: Vitals:   11/09/18 2300 11/09/18 2340 11/09/18 2341 11/10/18 0503  BP:    (!) 97/59  Pulse:  76 76 63  Resp:  16 16   Temp: 98.6 F (37 C)   97.9 F (36.6 C)  TempSrc: Oral   Oral  SpO2:  95% 95% 96%  Weight:      Height:        Intake/Output Summary (Last 24 hours) at 11/10/2018 1213 Last data filed at  11/10/2018 [id number] Gross per 24 hour  Intake 3452.35 ml  Output 150 ml  Net 3302.35 ml   Filed Weights   11/09/18 1241  Weight: 87.5 kg     REVIEW OF SYSTEMS  As per history otherwise all reviewed and reported negative  Exam:  General exam: awake, alert, NAD, cooperative.  Respiratory system: Clear. No increased work of breathing. Cardiovascular system: S1 & S2 heard. No JVD, murmurs, gallops, clicks or pedal edema. Gastrointestinal system: Abdomen is nondistended, soft and nontender. Normal bowel sounds heard. Central nervous system: Alert and oriented. No focal neurological deficits. Extremities: no CCE.  Data Reviewed: Basic Metabolic Panel: Recent Labs  Lab 11/09/18 1316 11/10/18 0203  NA 133* 137  K 3.6 3.1*  CL 102 111  CO2 21* 20*  GLUCOSE 227* 162*  BUN 39* 32*  CREATININE 1.37* 0.85  CALCIUM 8.3* 7.0*  MG  --  1.6*   Liver Function Tests: Recent Labs  Lab 11/09/18 1316  AST 29  ALT 28  ALKPHOS 42  BILITOT 0.2*  PROT 6.3*  ALBUMIN 3.5   Recent Labs  Lab 11/09/18 1316  LIPASE 19   No results for input(s): AMMONIA in the last 168 hours. CBC: Recent Labs  Lab 11/09/18 1316 11/10/18 0203  WBC 18.0* 16.2*  NEUTROABS 16.7*  --   HGB 13.5 11.1*  HCT 41.2 34.2*  MCV 91.4 93.4  PLT 120* 84*   Cardiac Enzymes: No results for input(s): CKTOTAL, CKMB, CKMBINDEX, TROPONINI in the last 168  hours. CBG (last 3)  Recent Labs    11/09/18 2102 11/10/18 0728 11/10/18 1201  GLUCAP 169* 112* 184*   Recent Results (from the past 240 hour(s))  SARS Coronavirus 2 [facility] order, Performed in [facility] hospital lab) Nasopharyngeal Nasopharyngeal Swab     Status: None   Collection Time: 11/09/18  2:25 PM   Specimen: Nasopharyngeal Swab  Result Value Ref Range Status   SARS Coronavirus 2 NEGATIVE NEGATIVE Final    Comment: (NOTE) If result is NEGATIVE SARS-CoV-2 target nucleic acids are NOT DETECTED. The SARS-CoV-2 RNA is generally detectable in  upper and lower  respiratory specimens during the acute phase of infection. The lowest  concentration of SARS-CoV-2 viral copies this assay can detect is 250  copies / mL. A negative result does not preclude SARS-CoV-2 infection  and should not be used as the sole basis for treatment or other  patient management decisions.  A negative result may occur with  improper specimen collection / handling, submission of specimen other  than nasopharyngeal swab, presence of viral mutation(s) within the  areas targeted by this assay, and inadequate number of viral copies  (<250 copies / mL). A negative result must be combined with clinical  observations, patient history, and epidemiological information. If result is POSITIVE SARS-CoV-2 target nucleic acids are DETECTED. The SARS-CoV-2 RNA is generally detectable in upper and lower  respiratory specimens dur ing the acute phase of infection.  Positive  results are indicative of active infection with SARS-CoV-2.  Clinical  correlation with patient history and other diagnostic information is  necessary to determine patient infection status.  Positive results do  not rule out bacterial infection or co-infection with other viruses. If result is PRESUMPTIVE POSTIVE SARS-CoV-2 nucleic acids MAY BE PRESENT.   A presumptive positive result was obtained on the submitted specimen  and confirmed on repeat testing.  While 2019 novel coronavirus  (SARS-CoV-2) nucleic acids may be present in the submitted sample  additional confirmatory testing may be necessary for epidemiological  and / or clinical management purposes  to differentiate between  SARS-CoV-2 and other Sarbecovirus currently known to infect humans.  If clinically indicated additional testing with an alternate test  methodology [phone]) is advised. The SARS-CoV-2 RNA is generally  detectable in upper and lower respiratory sp ecimens during the acute  phase of infection. The expected result is  Negative. Fact Sheet for Patients:  StrictlyIdeas.no Fact Sheet for Healthcare Providers: BankingDealers.co.za This test is not yet approved or cleared by the [place] FDA and has been authorized for detection and/or diagnosis of SARS-CoV-2 by FDA under an Emergency Use Authorization (EUA).  This EUA will remain in effect (meaning this test can be used) for the duration of the COVID-19 declaration under Section 564(b)(1) of the Act, 21 U.S.C. section 360bbb-3(b)(1), unless the authorization is terminated or revoked sooner. Performed at [facility], [address] [place], [place] [address]   Blood Culture (routine x 2)     Status: None (Preliminary result)   Collection Time: 11/09/18  2:39 PM   Specimen: BLOOD  Result Value Ref Range Status   Specimen Description   Final    BLOOD LEFT ANTECUBITAL Performed at [facility] [address], [place], [place] [address]    Special Requests   Final    BOTTLES DRAWN AEROBIC AND ANAEROBIC Blood Culture adequate volume Performed at [facility], [place] [address] [place],  [address]    Culture  Setup Time  Final    GRAM NEGATIVE RODS Gram Stain Report Called to,Read Back By and Verified With: HARRIS,B @ [id number] ON 11/10/18 BY JUW ANAEROBIC BOTTLE ONLY GS DONE @ [facility] CRITICAL RESULT CALLED TO, READ BACK BY AND VERIFIED WITH: PHARMD STEVEN H. [id number] FCP GRAM NEGATIVE RODS AEROBIC BOTTLE ONLY  RESULT PREV. CALLED Performed at [facility], [address] [place], [place] [address]    Culture [place]  Final   Report Status PENDING  Incomplete  Blood Culture ID Panel (Reflexed)     Status: Abnormal   Collection Time: 11/09/18  2:39 PM  Result Value Ref Range Status   Enterococcus species NOT DETECTED NOT DETECTED Final   Listeria monocytogenes NOT DETECTED NOT DETECTED Final   Staphylococcus species NOT DETECTED NOT DETECTED Final   Staphylococcus aureus  (BCID) NOT DETECTED NOT DETECTED Final   Streptococcus species NOT DETECTED NOT DETECTED Final   Streptococcus agalactiae NOT DETECTED NOT DETECTED Final   Streptococcus pneumoniae NOT DETECTED NOT DETECTED Final   Streptococcus pyogenes NOT DETECTED NOT DETECTED Final   Acinetobacter baumannii NOT DETECTED NOT DETECTED Final   Enterobacteriaceae species DETECTED (A) NOT DETECTED Final    Comment: Enterobacteriaceae represent a large family of gram-negative bacteria, not a single organism. CRITICAL RESULT CALLED TO, READ BACK BY AND VERIFIED WITH: PHARMD STEVEN H. [id number] FCP    Enterobacter cloacae complex NOT DETECTED NOT DETECTED Final   Escherichia coli NOT DETECTED NOT DETECTED Final   Klebsiella oxytoca NOT DETECTED NOT DETECTED Final   Klebsiella pneumoniae DETECTED (A) NOT DETECTED Final    Comment: CRITICAL RESULT CALLED TO, READ BACK BY AND VERIFIED WITH: PHARMD STEVEN H. [phone] FCP    Proteus species NOT DETECTED NOT DETECTED Final   Serratia marcescens NOT DETECTED NOT DETECTED Final   Carbapenem resistance NOT DETECTED NOT DETECTED Final   Haemophilus influenzae NOT DETECTED NOT DETECTED Final   Neisseria meningitidis NOT DETECTED NOT DETECTED Final   Pseudomonas aeruginosa NOT DETECTED NOT DETECTED Final   Candida albicans NOT DETECTED NOT DETECTED Final   Candida glabrata NOT DETECTED NOT DETECTED Final   Candida krusei NOT DETECTED NOT DETECTED Final   Candida parapsilosis NOT DETECTED NOT DETECTED Final   Candida tropicalis NOT DETECTED NOT DETECTED Final    Comment: Performed at [facility], [address] [place], [place] [address]  Blood Culture (routine x 2)     Status: None (Preliminary result)   Collection Time: 11/09/18  2:44 PM   Specimen: BLOOD LEFT ARM  Result Value Ref Range Status   Specimen Description   Final    BLOOD LEFT ARM Performed at [facility], [place] [address] [place], [place] [address]    Special Requests   Final     BOTTLES DRAWN AEROBIC AND ANAEROBIC Blood Culture adequate volume Performed at [facility], [place] [address] [place], [place] [address]    Culture  Setup Time   Final    GRAM NEGATIVE RODS Gram Stain Report Called to,Read Back By and Verified With: HARRIS,B @ [id number] ON 11/10/18 BY JUW ANAEROBIC BLT GS DONE @ [facility] GRAM NEGATIVE RODS AEROBIC BOTTLE ONLY  RESULT PREV. CALLED Performed at [facility], [address], [place], [place] [address]    Culture GRAM NEGATIVE RODS  Final   Report Status PENDING  Incomplete     Studies: Dg Chest 2 View  Result Date: 11/09/2018 CLINICAL DATA:  Weakness with chills last night  and vomiting x2. EXAM: CHEST - 2 VIEW COMPARISON:  04/15/2013 FINDINGS: Lungs are adequately inflated. There is no focal airspace consolidation or effusion. Cardiomediastinal silhouette and remainder of the exam is unchanged to include partial fusion of 2 lower thoracic vertebral bodies. IMPRESSION: No active cardiopulmonary disease. Electronically Signed   By: Marin Olp M.D.   On: 11/09/2018 14:11     Scheduled Meds: . aspirin EC  81 mg Oral Daily  . ezetimibe  10 mg Oral Daily  . finasteride  5 mg Oral Daily  . FLUoxetine  60 mg Oral Daily  . fluticasone  1-2 spray Each Nare Daily  . gabapentin  300 mg Oral TID  . [START ON 11/11/2018] influenza vaccine adjuvanted  0.5 mL Intramuscular Tomorrow-1000  . insulin aspart  0-5 Units Subcutaneous QHS  . insulin aspart  0-9 Units Subcutaneous TID WC  . multivitamin with minerals  1 tablet Oral Daily  . olopatadine  1 drop Both Eyes BID  . omega-3 acid ethyl esters  4 capsule Oral Daily  . pantoprazole  40 mg Oral Daily  . Ensure Max Protein  11 oz Oral TID WC  . sodium chloride flush  3 mL Intravenous Q12H  . vitamin C  1,000 mg Oral Daily   Continuous Infusions: . sodium chloride    . sodium chloride 75 mL/hr at 11/10/18 1114  . cefTRIAXone (ROCEPHIN)  IV Stopped (11/09/18 1945)    Principal Problem:   Sepsis  secondary to UTI [facility]) Active Problems:   Obstructive sleep apnea on CPAP   Depression, recurrent (HCC)   BPH (benign prostatic hyperplasia)   T2DM (type 2 diabetes mellitus) ([place])   AKI (acute kidney injury) ([place])   UTI (urinary tract infection)   Lactic acidosis   HTN (hypertension)   Time spent:   Irwin Brakeman, MD [facility] 11/10/2018, 12:13 PM    LOS: 1 day  How to contact the [facility] Attending or Consulting provider [place] or covering provider during after hours [place], for this patient?  1. Check the care team in [facility] and look for a) attending/consulting [facility] provider listed and b) the [facility] team listed 2. Log into [email or web address] and use [place]'s universal password to access. If you do not have the password, please contact the hospital operator. 3. Locate the [facility] provider you are looking for under [facility] and page to a number that you can be directly reached. 4. If you still have difficulty reaching the provider, please page the [facility] (Director on Call) for the Hospitalists listed on amion for assistance.

## 2018-11-10 NOTE — Progress Notes (Signed)
PHARMACY - PHYSICIAN COMMUNICATION CRITICAL VALUE ALERT - BLOOD CULTURE IDENTIFICATION (BCID)  Melvin Scarce. is an 68 y.o. male who presented to [facility] on 11/09/2018 with a chief complaint of chils  Assessment:  Klebsiella pneumoniae BCID result   Current antibiotics: Ceftriaxone 2000 mg IV every 24 hours.  Changes to prescribed antibiotics recommended:  Patient is on recommended antibiotics - No changes needed  Results for orders placed or performed during the hospital encounter of 11/09/18  Blood Culture ID Panel (Reflexed) (Collected: 11/09/2018  2:39 PM)  Result Value Ref Range   Enterococcus species NOT DETECTED NOT DETECTED   Listeria monocytogenes NOT DETECTED NOT DETECTED   Staphylococcus species NOT DETECTED NOT DETECTED   Staphylococcus aureus (BCID) NOT DETECTED NOT DETECTED   Streptococcus species NOT DETECTED NOT DETECTED   Streptococcus agalactiae NOT DETECTED NOT DETECTED   Streptococcus pneumoniae NOT DETECTED NOT DETECTED   Streptococcus pyogenes NOT DETECTED NOT DETECTED   Acinetobacter baumannii NOT DETECTED NOT DETECTED   Enterobacteriaceae species DETECTED (A) NOT DETECTED   Enterobacter cloacae complex NOT DETECTED NOT DETECTED   Escherichia coli NOT DETECTED NOT DETECTED   Klebsiella oxytoca NOT DETECTED NOT DETECTED   Klebsiella pneumoniae DETECTED (A) NOT DETECTED   Proteus species NOT DETECTED NOT DETECTED   Serratia marcescens NOT DETECTED NOT DETECTED   Carbapenem resistance NOT DETECTED NOT DETECTED   Haemophilus influenzae NOT DETECTED NOT DETECTED   Neisseria meningitidis NOT DETECTED NOT DETECTED   Pseudomonas aeruginosa NOT DETECTED NOT DETECTED   Candida albicans NOT DETECTED NOT DETECTED   Candida glabrata NOT DETECTED NOT DETECTED   Candida krusei NOT DETECTED NOT DETECTED   Candida parapsilosis NOT DETECTED NOT DETECTED   Candida tropicalis NOT DETECTED NOT DETECTED    Melvin [name] 11/10/2018  8:13 AM

## 2018-11-10 NOTE — Progress Notes (Signed)
Blood culture, Anaerobic bottle positive fir Gram Negative rods. [id number] 11/10/18.

## 2018-11-11 LAB — CBC
HCT: 35.8 % — ABNORMAL LOW (ref 39.0–52.0)
Hemoglobin: 11.3 g/dL — ABNORMAL LOW (ref 13.0–17.0)
MCH: 29.4 pg (ref 26.0–34.0)
MCHC: 31.6 g/dL (ref 30.0–36.0)
MCV: 93.2 fL (ref 80.0–100.0)
Platelets: 76 10*3/uL — ABNORMAL LOW (ref 150–400)
RBC: 3.84 MIL/uL — ABNORMAL LOW (ref 4.22–5.81)
RDW: 14.8 % (ref 11.5–15.5)
WBC: 11 10*3/uL — ABNORMAL HIGH (ref 4.0–10.5)
nRBC: 0 % (ref 0.0–0.2)

## 2018-11-11 LAB — BASIC METABOLIC PANEL
Anion gap: 6 (ref 5–15)
BUN: 14 mg/dL (ref 8–23)
CO2: 23 mmol/L (ref 22–32)
Calcium: 7.9 mg/dL — ABNORMAL LOW (ref 8.9–10.3)
Chloride: 114 mmol/L — ABNORMAL HIGH (ref 98–111)
Creatinine, Ser: 0.5 mg/dL — ABNORMAL LOW (ref 0.61–1.24)
GFR calc Af Amer: >60 mL/min (ref >60)
GFR calc non Af Amer: >60 mL/min (ref >60)
Glucose, Bld: 164 mg/dL — ABNORMAL HIGH (ref 70–99)
Potassium: 3.5 mmol/L (ref 3.5–5.1)
Sodium: 143 mmol/L (ref 135–145)

## 2018-11-11 LAB — GLUCOSE, CAPILLARY
Glucose-Capillary: 143 mg/dL — ABNORMAL HIGH (ref 70–99)
Glucose-Capillary: 161 mg/dL — ABNORMAL HIGH (ref 70–99)
Glucose-Capillary: 119 mg/dL — ABNORMAL HIGH (ref 70–99)
Glucose-Capillary: 173 mg/dL — ABNORMAL HIGH (ref 70–99)

## 2018-11-11 LAB — MAGNESIUM: Magnesium: 2 mg/dL (ref 1.7–2.4)

## 2018-11-11 NOTE — Evaluation (Signed)
Physical Therapy Evaluation Patient Details Name: Melvin [name]. MRN: [id number] DOB: 09/25/1950 Today's Date: 11/11/2018   History of Present Illness  Werner Labella  is a 68 y.o. male with past medical history relevant for DM2, HTN, HLD, and depressive disorder and possible OSA as well as BPH who presents to the ED with fevers chills Rigors    Clinical Impression  Patient functioning at baseline for functional mobility and gait.  Plan:  Patient discharged from physical therapy to care of nursing for ambulation daily as tolerated for length of stay.     Follow Up Recommendations Outpatient PT(continue)    Equipment Recommendations  None recommended by PT    Recommendations for Other Services       Precautions / Restrictions Precautions Precautions: None Restrictions Weight Bearing Restrictions: No      Mobility  Bed Mobility Overal bed mobility: Independent                Transfers Overall transfer level: Modified independent                  Ambulation/Gait Ambulation/Gait assistance: Modified independent (Device/Increase time) Gait Distance (Feet): 200 Feet Assistive device: Straight cane Gait Pattern/deviations: Step-through pattern;WFL(Within Functional Limits) Gait velocity: slightly decreased   General Gait Details: demonstrates good return for ambulation on level, inclined and declined surfaces without loss of balance  Stairs            Wheelchair Mobility    Modified Rankin (Stroke Patients Only)       Balance Overall balance assessment: No apparent balance deficits (not formally assessed)                                           Pertinent Vitals/Pain Pain Assessment: 0-10 Pain Score: 4  Pain Location: low back, bilateral hips, right foot/ankle Pain Descriptors / Indicators: Aching;Sore Pain Intervention(s): Limited activity within patient's tolerance;Monitored during session;Patient requesting pain  meds-RN notified    Home Living Family/patient expects to be discharged to:: Private residence Living Arrangements: Spouse/significant other Available Help at Discharge: Family;Available 24 hours/day Type of Home: House Home Access: Stairs to enter Entrance Stairs-Rails: None Entrance Stairs-Number of Steps: 3-4 Home Layout: Two level;Bed/bath upstairs Home Equipment: Cane - single point;Shower seat;Wheelchair - manual      Prior Function Level of Independence: Independent with assistive device(s)         Comments: household and short distanced commnity ambulator with SPC PRN     Hand Dominance   Dominant Hand: Right    Extremity/Trunk Assessment   Upper Extremity Assessment Upper Extremity Assessment: Overall WFL for tasks assessed    Lower Extremity Assessment Lower Extremity Assessment: Overall WFL for tasks assessed    Cervical / Trunk Assessment Cervical / Trunk Assessment: Normal  Communication   Communication: No difficulties  Cognition Arousal/Alertness: Awake/alert Behavior During Therapy: WFL for tasks assessed/performed Overall Cognitive Status: Within Functional Limits for tasks assessed                                        General Comments      Exercises     Assessment/Plan    PT Assessment All further PT needs can be met in the next venue of care  PT Problem List Decreased strength;Decreased activity  tolerance;Decreased mobility;Decreased balance       PT Treatment Interventions      PT Goals (Current goals can be found in the Care Plan section)  Acute Rehab PT Goals Patient Stated Goal: return home with spouse to assist PT Goal Formulation: With patient Time For Goal Achievement: 11/11/18 Potential to Achieve Goals: Good    Frequency     Barriers to discharge        Co-evaluation               AM-PAC PT "6 Clicks" Mobility  Outcome Measure Help needed turning from your back to your side while in a  flat bed without using bedrails?: None Help needed moving from lying on your back to sitting on the side of a flat bed without using bedrails?: None Help needed moving to and from a bed to a chair (including a wheelchair)?: None Help needed standing up from a chair using your arms (e.g., wheelchair or bedside chair)?: None Help needed to walk in hospital room?: None Help needed climbing 3-5 steps with a railing? : A Little 6 Click Score: 23    End of Session   Activity Tolerance: Patient tolerated treatment well;Patient limited by pain Patient left: in chair;with call bell/phone within reach Nurse Communication: Mobility status PT Visit Diagnosis: Unsteadiness on feet (R26.81);Other abnormalities of gait and mobility (R26.89);Muscle weakness (generalized) (M62.81)    Time: 2493-2419 PT Time Calculation (min) (ACUTE ONLY): 29 min   Charges:   PT Evaluation $PT Eval Moderate Complexity: 1 Mod PT Treatments $Therapeutic Activity: 23-37 mins        2:49 PM, 11/11/18 Lonell Grandchild, MPT Physical Therapist with [facility] [phone] office [phone] mobile phone

## 2018-11-11 NOTE — Progress Notes (Signed)
PROGRESS NOTE    Melvin [name].  [id number]  DOB: Jun 24, 1950  DOA: 11/09/2018 PCP: Baruch Gouty, FNP   Brief Admission Hx: 68 y.o. male with past medical history relevant for DM2, HTN, HLD, and depressive disorder and possible OSA as well as BPH who presents to the ED with fevers, chills, and rigors.  MDM/Assessment & Plan:   1. Sepsis secondary to Klebsiella bacteremia-improving clinically, continue IV ceftriaxone 2 g every 24 hours until C&S results are available.  Continue supportive therapy and monitor closely. 2. Lactic acidosis-resolved now, secondary to sepsis, resolved with IV fluid hydration. 3. AKI-resolved now, secondary to severe dehydration and ACE inhibitor use.  Treating with IV fluid hydration and following closely. 4. Type 2 diabetes mellitus- he has been transitioned to diet control of the disease given his low A1c of 5.1%.  Holding metformin at this time.  Sliding scale coverage as needed for elevated blood glucose readings. 5. Essential hypertension-he is having soft blood pressures and we are holding his home lisinopril and amlodipine. 6. Depression-continue home Prozac. 7. BPH-continue home finish steroid. 8. Thrombocytopenia-further drop in platelets prompted discontinuing subcu heparin.  SCDs ordered for DVT prophylaxis.  Outpatient follow up with hematology.    DVT prophylaxis: SCDs Code Status: Full Family Communication: Patient updated at bedside, verbalized understanding Disposition Plan: Continue inpatient treatment for IV fluids and IV antibiotics   Consultants:    Procedures:    Antimicrobials:  Ceftriaxone 10/4 >>   Subjective: Pt starting to feel much better.   Objective: Vitals:   11/10/18 0503 11/10/18 2144 11/11/18 0518 11/11/18 0756  BP: (!) 97/59 124/85 (!) 147/79   Pulse: 63 60 (!) 56   Resp:  15 18   Temp: 97.9 F (36.6 C) 98.2 F (36.8 C) 97.8 F (36.6 C)   TempSrc: Oral Oral Oral   SpO2: 96% 96% 96% 97%   Weight:      Height:        Intake/Output Summary (Last 24 hours) at 11/11/2018 1215 Last data filed at 11/10/2018 1700 Gross per 24 hour  Intake 480 ml  Output 850 ml  Net -370 ml   Filed Weights   11/09/18 1241  Weight: 87.5 kg     REVIEW OF SYSTEMS  As per history otherwise all reviewed and reported negative  Exam:  General exam: awake, alert, NAD, cooperative.  Respiratory system: Clear. No increased work of breathing. Cardiovascular system: S1 & S2 heard. No JVD, murmurs, gallops, clicks or pedal edema. Gastrointestinal system: Abdomen is nondistended, soft and nontender. Normal bowel sounds heard. Central nervous system: Alert and oriented. No focal neurological deficits. Extremities: no CCE.  Data Reviewed: Basic Metabolic Panel: Recent Labs  Lab 11/09/18 1316 11/10/18 0203 11/11/18 0617  NA 133* 137 143  K 3.6 3.1* 3.5  CL 102 111 114*  CO2 21* 20* 23  GLUCOSE 227* 162* 164*  BUN 39* 32* 14  CREATININE 1.37* 0.85 0.50*  CALCIUM 8.3* 7.0* 7.9*  MG  --  1.6* 2.0   Liver Function Tests: Recent Labs  Lab 11/09/18 1316  AST 29  ALT 28  ALKPHOS 42  BILITOT 0.2*  PROT 6.3*  ALBUMIN 3.5   Recent Labs  Lab 11/09/18 1316  LIPASE 19   No results for input(s): AMMONIA in the last 168 hours. CBC: Recent Labs  Lab 11/09/18 1316 11/10/18 0203 11/11/18 0617  WBC 18.0* 16.2* 11.0*  NEUTROABS 16.7*  --   --   HGB 13.5 11.1* 11.3*  HCT 41.2 34.2* 35.8*  MCV 91.4 93.4 93.2  PLT 120* 84* 76*   Cardiac Enzymes: No results for input(s): CKTOTAL, CKMB, CKMBINDEX, TROPONINI in the last 168 hours. CBG (last 3)  Recent Labs    11/10/18 2145 11/11/18 0826 11/11/18 1141  GLUCAP 116* 143* 161*   Recent Results (from the past 240 hour(s))  SARS Coronavirus 2 [facility] order, Performed in [facility] hospital lab) Nasopharyngeal Nasopharyngeal Swab     Status: None   Collection Time: 11/09/18  2:25 PM   Specimen: Nasopharyngeal Swab  Result Value Ref  Range Status   SARS Coronavirus 2 NEGATIVE NEGATIVE Final    Comment: (NOTE) If result is NEGATIVE SARS-CoV-2 target nucleic acids are NOT DETECTED. The SARS-CoV-2 RNA is generally detectable in upper and lower  respiratory specimens during the acute phase of infection. The lowest  concentration of SARS-CoV-2 viral copies this assay can detect is 250  copies / mL. A negative result does not preclude SARS-CoV-2 infection  and should not be used as the sole basis for treatment or other  patient management decisions.  A negative result may occur with  improper specimen collection / handling, submission of specimen other  than nasopharyngeal swab, presence of viral mutation(s) within the  areas targeted by this assay, and inadequate number of viral copies  (<250 copies / mL). A negative result must be combined with clinical  observations, patient history, and epidemiological information. If result is POSITIVE SARS-CoV-2 target nucleic acids are DETECTED. The SARS-CoV-2 RNA is generally detectable in upper and lower  respiratory specimens dur ing the acute phase of infection.  Positive  results are indicative of active infection with SARS-CoV-2.  Clinical  correlation with patient history and other diagnostic information is  necessary to determine patient infection status.  Positive results do  not rule out bacterial infection or co-infection with other viruses. If result is PRESUMPTIVE POSTIVE SARS-CoV-2 nucleic acids MAY BE PRESENT.   A presumptive positive result was obtained on the submitted specimen  and confirmed on repeat testing.  While 2019 novel coronavirus  (SARS-CoV-2) nucleic acids may be present in the submitted sample  additional confirmatory testing may be necessary for epidemiological  and / or clinical management purposes  to differentiate between  SARS-CoV-2 and other Sarbecovirus currently known to infect humans.  If clinically indicated additional testing with an  alternate test  methodology [phone]) is advised. The SARS-CoV-2 RNA is generally  detectable in upper and lower respiratory sp ecimens during the acute  phase of infection. The expected result is Negative. Fact Sheet for Patients:  StrictlyIdeas.no Fact Sheet for Healthcare Providers: BankingDealers.co.za This test is not yet approved or cleared by the [place] FDA and has been authorized for detection and/or diagnosis of SARS-CoV-2 by FDA under an Emergency Use Authorization (EUA).  This EUA will remain in effect (meaning this test can be used) for the duration of the COVID-19 declaration under Section 564(b)(1) of the Act, 21 U.S.C. section 360bbb-3(b)(1), unless the authorization is terminated or revoked sooner. Performed at [facility], [address] [place], [place] [address]   Blood Culture (routine x 2)     Status: Abnormal (Preliminary result)   Collection Time: 11/09/18  2:39 PM   Specimen: BLOOD  Result Value Ref Range Status   Specimen Description   Final    BLOOD LEFT ANTECUBITAL Performed at [place] [address], [place],  [address]    Special Requests   Final  BOTTLES DRAWN AEROBIC AND ANAEROBIC Blood Culture adequate volume Performed at [facility], [place] [address] [place], [place] [address]    Culture  Setup Time   Final    GRAM NEGATIVE RODS Gram Stain Report Called to,Read Back By and Verified With: HARRIS,B @ [id number] ON 11/10/18 BY JUW ANAEROBIC BOTTLE ONLY GS DONE @ [facility] CRITICAL RESULT CALLED TO, READ BACK BY AND VERIFIED WITH: PHARMD STEVEN H. [id number] FCP GRAM NEGATIVE RODS AEROBIC BOTTLE ONLY  RESULT PREV. CALLED Performed at [facility], [address] [place], [place] [address]    Culture (A)  Final    KLEBSIELLA PNEUMONIAE SUSCEPTIBILITIES TO FOLLOW Performed at [facility], [place] [address] [place], [place] [address]    Report Status PENDING  Incomplete    Blood Culture ID Panel (Reflexed)     Status: Abnormal   Collection Time: 11/09/18  2:39 PM  Result Value Ref Range Status   Enterococcus species NOT DETECTED NOT DETECTED Final   Listeria monocytogenes NOT DETECTED NOT DETECTED Final   Staphylococcus species NOT DETECTED NOT DETECTED Final   Staphylococcus aureus (BCID) NOT DETECTED NOT DETECTED Final   Streptococcus species NOT DETECTED NOT DETECTED Final   Streptococcus agalactiae NOT DETECTED NOT DETECTED Final   Streptococcus pneumoniae NOT DETECTED NOT DETECTED Final   Streptococcus pyogenes NOT DETECTED NOT DETECTED Final   Acinetobacter baumannii NOT DETECTED NOT DETECTED Final   Enterobacteriaceae species DETECTED (A) NOT DETECTED Final    Comment: Enterobacteriaceae represent a large family of gram-negative bacteria, not a single organism. CRITICAL RESULT CALLED TO, READ BACK BY AND VERIFIED WITH: PHARMD STEVEN H. [id number] FCP    Enterobacter cloacae complex NOT DETECTED NOT DETECTED Final   Escherichia coli NOT DETECTED NOT DETECTED Final   Klebsiella oxytoca NOT DETECTED NOT DETECTED Final   Klebsiella pneumoniae DETECTED (A) NOT DETECTED Final    Comment: CRITICAL RESULT CALLED TO, READ BACK BY AND VERIFIED WITH: PHARMD STEVEN H. [phone] FCP    Proteus species NOT DETECTED NOT DETECTED Final   Serratia marcescens NOT DETECTED NOT DETECTED Final   Carbapenem resistance NOT DETECTED NOT DETECTED Final   Haemophilus influenzae NOT DETECTED NOT DETECTED Final   Neisseria meningitidis NOT DETECTED NOT DETECTED Final   Pseudomonas aeruginosa NOT DETECTED NOT DETECTED Final   Candida albicans NOT DETECTED NOT DETECTED Final   Candida glabrata NOT DETECTED NOT DETECTED Final   Candida krusei NOT DETECTED NOT DETECTED Final   Candida parapsilosis NOT DETECTED NOT DETECTED Final   Candida tropicalis NOT DETECTED NOT DETECTED Final    Comment: Performed at [facility], [address], [place], [place] [address]   Blood Culture (routine x 2)     Status: Abnormal (Preliminary result)   Collection Time: 11/09/18  2:44 PM   Specimen: BLOOD LEFT ARM  Result Value Ref Range Status   Specimen Description   Final    BLOOD LEFT ARM Performed at [facility], [place] [address] [place], [place] [address]    Special Requests   Final    BOTTLES DRAWN AEROBIC AND ANAEROBIC Blood Culture adequate volume Performed at [facility], [place] [address] [place],  [address]    Culture  Setup Time   Final    GRAM NEGATIVE RODS Gram Stain Report Called to,Read Back By and Verified With: HARRIS,B @ [id number] ON 11/10/18 BY JUW ANAEROBIC BLT GS DONE @ [facility] GRAM NEGATIVE RODS AEROBIC BOTTLE ONLY  RESULT PREV. CALLED Performed at [facility], [address], [place], [place] [address]    Culture KLEBSIELLA PNEUMONIAE (A)  Final   Report Status PENDING  Incomplete  Urine culture     Status: Abnormal (Preliminary result)   Collection Time: 11/09/18  3:00 PM   Specimen: In/Out Cath Urine  Result Value Ref Range Status   Specimen Description   Final    IN/OUT CATH URINE Performed at [facility], [address] [place], [place] [address]    Special Requests   Final    NONE Performed at [facility], [address], [place], [place] [address]    Culture >=100,000 COLONIES/mL KLEBSIELLA PNEUMONIAE (A)  Final   Report Status PENDING  Incomplete     Studies: Dg Chest 2 View  Result Date: 11/09/2018 CLINICAL DATA:  Weakness with chills last night and vomiting x2. EXAM: CHEST - 2 VIEW COMPARISON:  04/15/2013 FINDINGS: Lungs are adequately inflated. There is no focal airspace consolidation or effusion. Cardiomediastinal silhouette and remainder of the exam is unchanged to include partial fusion of 2 lower thoracic vertebral bodies. IMPRESSION: No active cardiopulmonary disease. Electronically Signed   By: Marin Olp M.D.   On: 11/09/2018 14:11   Scheduled Meds:  aspirin EC  81 mg Oral Daily   ezetimibe   10 mg Oral Daily   finasteride  5 mg Oral Daily   FLUoxetine  60 mg Oral Daily   fluticasone  1-2 spray Each Nare Daily   gabapentin  300 mg Oral TID   insulin aspart  0-5 Units Subcutaneous QHS   insulin aspart  0-9 Units Subcutaneous TID WC   multivitamin with minerals  1 tablet Oral Daily   olopatadine  1 drop Both Eyes BID   omega-3 acid ethyl esters  4 capsule Oral Daily   pantoprazole  40 mg Oral Daily   Ensure Max Protein  11 oz Oral TID WC   sodium chloride flush  3 mL Intravenous Q12H   vitamin C  1,000 mg Oral Daily   Continuous Infusions:  sodium chloride     sodium chloride 75 mL/hr at 11/10/18 1831   cefTRIAXone (ROCEPHIN)  IV 2 g (11/10/18 1740)    Principal Problem:   Sepsis secondary to UTI [facility]) Active Problems:   Obstructive sleep apnea on CPAP   Depression, recurrent (HCC)   BPH (benign prostatic hyperplasia)   T2DM (type 2 diabetes mellitus) ([place])   AKI (acute kidney injury) ([place])   UTI (urinary tract infection)   Lactic acidosis   HTN (hypertension)   Time spent:   Irwin Brakeman, MD [facility] 11/11/2018, 12:15 PM    LOS: 2 days  How to contact the [facility] Attending or Consulting provider [place] or covering provider during after hours 7P -7A, for this patient?  1. Check the care team in [facility] and look for a) attending/consulting [facility] provider listed and b) the [facility] team listed 2. Log into [email or web address] and use [facility]'s universal password to access. If you do not have the password, please contact the hospital operator. 3. Locate the [facility] provider you are looking for under [facility] and page to a number that you can be directly reached. 4. If you still have difficulty reaching the provider, please page the [facility] (Director on Call) for the Hospitalists listed on amion for assistance.

## 2018-11-11 NOTE — Care Management Important Message (Signed)
Important Message  Patient Details  Name: Melvin [name]. MRN: [id number] Date of Birth: July 09, 1950   Medicare Important Message Given:  Yes     Tommy Medal 11/11/2018, 4:11 PM

## 2018-11-12 DIAGNOSIS — R2681 Unsteadiness on feet: Secondary | ICD-10-CM

## 2018-11-12 LAB — CULTURE, BLOOD (ROUTINE X 2)
Special Requests: ADEQUATE
Special Requests: ADEQUATE

## 2018-11-12 LAB — URINE CULTURE: Culture: >=100000 — AB

## 2018-11-12 MED ORDER — ENALAPRILAT 1.25 MG/ML IV SOLN: 1.2500 mg | Freq: Four times a day (QID) | INTRAVENOUS | Status: DC | PRN

## 2018-11-12 MED ORDER — CEPHALEXIN 500 MG PO CAPS: 500.0000 mg | ORAL_CAPSULE | Freq: Three times a day (TID) | ORAL | 0 refills | Status: AC

## 2018-11-12 MED ORDER — ACIDOPHILUS PROBIOTIC 100 MG PO CAPS: 1.0000 | ORAL_CAPSULE | Freq: Three times a day (TID) | ORAL | 0 refills | Status: AC

## 2018-11-12 NOTE — Discharge Summary (Signed)
Physician Discharge Summary  Melvin [name]. HR:[id number] DOB: [birth year] DOA: 11/09/2018  PCP: Baruch Gouty, FNP  Admit date: 11/09/2018 Discharge date: 11/12/2018  Admitted From:  Home  Disposition:  Home   Recommendations for Outpatient Follow-up:  1. Follow up with PCP in 1 weeks  Home Health:  Pt declined outpatient PT  Discharge Condition: STABLE   CODE STATUS: FULL    Brief Hospitalization Summary: Please see all hospital notes, images, labs for full details of the hospitalization. Dr. Talmadge Coventry HPI:  [name]  is a 68 y.o. male with past medical history relevant for DM2, HTN, HLD, and depressive disorder and possible OSA as well as BPH who presents to the ED with fevers chills Rigors. Additional history obtained from patient's wife at bedside -Denies hematuria or frank dysuria he did have some urinary frequency, -He had nausea and multiple episodes of vomiting and one episode of loose stool -Emesis was without bile or blood, stool was without blood or mucus -No chest pains no palpitations, no dyspnea -Per patient's wife overnight patient was shivering and diaphoretic with presumed fevers -He had some abdominal discomfort however this is resolved and his abdominal exam in the ED is unremarkable  In ED--WBC is 18 K, creatinine is up to 1.37 from a baseline of 0.6, initial lactic acid is 2.8 up to two-point 9 repeat.  Platelets down to 120 from a baseline close to 200 -Lipase is not elevated, troponin is not elevated -Chest x-ray without acute findings -COVID-19 negative -UA suspicious for UTI blood and urine cultures sent  Brief Admission Hx: 67 y.o.malewith past medical history relevant for DM2, HTN, HLD, and depressive disorder and possible OSA as well as BPH who presents to the ED with fevers, chills, and rigors.  MDM/Assessment & Plan:   1. Sepsis secondary to Klebsiella bacteremia-improved clinically, he was treated with IV ceftriaxone 2 g every 24 hours  until C&S results were available.  He will discharge on oral cephalexin tablets to complete full 7 day course. 2. Lactic acidosis-resolved now, secondary to sepsis, resolved with IV fluid hydration. 3. AKI-resolved now, secondary to severe dehydration and ACE inhibitor use.  Treating with IV fluid hydration and following closely. 4. Type 2 diabetes mellitus- he has been transitioned to diet control of the disease given his low A1c of 5.1%.  Holding metformin at this time.  Sliding scale coverage as needed for elevated blood glucose readings. 5. Essential hypertension-he was initially having soft blood pressures but now can resume all of his regular blood pressure medications.  6. Depression-continue home Prozac. 7. BPH-continue home finish steroid. 8. Thrombocytopenia-further drop in platelets prompted discontinuing subcu heparin.  SCDs ordered for DVT prophylaxis.  Outpatient follow up with hematology.    DVT prophylaxis: SCDs Code Status: Full Family Communication: Patient updated at bedside, verbalized understanding Disposition Plan: Home with outpatient follow up   Consultants:    Procedures:    Antimicrobials:  Ceftriaxone 10/4 >>   Discharge Diagnoses:  Principal Problem:   Sepsis secondary to UTI [facility]) Active Problems:   Obstructive sleep apnea on CPAP   Depression, recurrent (HCC)   BPH (benign prostatic hyperplasia)   T2DM (type 2 diabetes mellitus) ([place])   AKI (acute kidney injury) ([place])   UTI (urinary tract infection)   Lactic acidosis   HTN (hypertension)   Gait instability   Discharge Instructions:  Allergies as of 11/12/2018      Reactions   Ativan [lorazepam] Other (See Comments)   Confusion &  agitation   Dilaudid [hydromorphone] Other (See Comments)   confusion   Morphine Other (See Comments)   Headaches   Seroquel [quetiapine] Other (See Comments)   Confusion/agitation   Statins Other (See Comments)   Muscle aches and myalgias with  Lipitor,Zocor,Vytorin,Pravachol,Crestor, and Livalo even at low doses Muscle aches and myalgias with Lipitor,Zocor,Vytorin,Pravachol,Crestor, and Livalo even at low doses      Medication List    TAKE these medications   Acidophilus Probiotic 100 MG Caps Take 1 capsule (100 mg total) by mouth 3 (three) times daily with meals for 14 days.   amLODipine 5 MG tablet Commonly known as: NORVASC Take 1.5 tablets (7.5 mg total) by mouth daily.   aspirin 81 MG EC tablet Take 1 tablet (81 mg total) by mouth daily.   cephALEXin 500 MG capsule Commonly known as: KEFLEX Take 1 capsule (500 mg total) by mouth 3 (three) times daily for 5 days.   ezetimibe 10 MG tablet Commonly known as: ZETIA Take 1 tablet (10 mg total) by mouth daily. (Please make appt w/ new PCP)   finasteride 1 MG tablet Commonly known as: PROPECIA TAKE ONE (1) TABLET EACH DAY   FLUoxetine 20 MG capsule Commonly known as: PROZAC Take 3 capsules (60 mg total) by mouth daily. (Please make appt w/ new PCP)   fluticasone 50 MCG/ACT nasal spray Commonly known as: FLONASE USE 1 TO 2 SPRAYS IN EACH NOSTRIL DAILY   gabapentin 100 MG capsule Commonly known as: NEURONTIN Take 3 capsules (300 mg total) by mouth 3 (three) times daily.   ibuprofen 800 MG tablet Commonly known as: ADVIL Take 1 tablet (800 mg total) by mouth every 8 (eight) hours as needed.   lisinopril 2.5 MG tablet Commonly known as: ZESTRIL Take 1 tablet (2.5 mg total) by mouth 2 (two) times daily. (Needs to be seen before next refill)   Melatonin 3 MG Tabs Take 1 tablet (3 mg total) by mouth at bedtime.   metFORMIN 500 MG 24 hr tablet Commonly known as: GLUCOPHAGE-XR Take 1,000 mg by mouth 2 (two) times daily with a meal.   multivitamin with minerals Tabs tablet Take 1 tablet by mouth daily.   Olopatadine HCl 0.2 % Soln Place 1 drop into both eyes daily.   Pazeo 0.7 % Soln Generic drug: Olopatadine HCl   omega-3 acid ethyl esters 1 g  capsule Commonly known as: LOVAZA TAKE 4 CAPSULES DAILY   OSTEO BI-FLEX JOINT SHIELD PO Take 2 tablets by mouth daily.   pantoprazole 40 MG tablet Commonly known as: PROTONIX Take 1 tablet (40 mg total) by mouth daily.   protein supplement shake Liqd Commonly known as: PREMIER PROTEIN Take 325 mLs (11 oz total) by mouth 3 (three) times daily with meals.   vitamin C 1000 MG tablet Take 1,000 mg by mouth daily.      Follow-up Information    Rakes, Connye Burkitt, FNP. Schedule an appointment as soon as possible for a visit in 1 week(s).   Specialty: Family Medicine Why: Hospital Follow Up  Contact information: [place] [address] [phone]        Derek Jack, MD. Schedule an appointment as soon as possible for a visit in 2 week(s).   Specialty: Hematology Why: Establish care for thrombocytopenia  Contact information: [place] [address] [phone]          Allergies  Allergen Reactions  . Ativan [Lorazepam] Other (See Comments)    Confusion &  agitation  . Dilaudid [Hydromorphone] Other (See Comments)    confusion  . Morphine Other (See Comments)    Headaches  . Seroquel [Quetiapine] Other (See Comments)    Confusion/agitation  . Statins Other (See Comments)    Muscle aches and myalgias with Lipitor,Zocor,Vytorin,Pravachol,Crestor, and Livalo even at low doses Muscle aches and myalgias with Lipitor,Zocor,Vytorin,Pravachol,Crestor, and Livalo even at low doses   Allergies as of 11/12/2018      Reactions   Ativan [lorazepam] Other (See Comments)   Confusion & agitation   Dilaudid [hydromorphone] Other (See Comments)   confusion   Morphine Other (See Comments)   Headaches   Seroquel [quetiapine] Other (See Comments)   Confusion/agitation   Statins Other (See Comments)   Muscle aches and myalgias with Lipitor,Zocor,Vytorin,Pravachol,Crestor, and Livalo even at low doses Muscle aches and myalgias with  Lipitor,Zocor,Vytorin,Pravachol,Crestor, and Livalo even at low doses      Medication List    TAKE these medications   Acidophilus Probiotic 100 MG Caps Take 1 capsule (100 mg total) by mouth 3 (three) times daily with meals for 14 days.   amLODipine 5 MG tablet Commonly known as: NORVASC Take 1.5 tablets (7.5 mg total) by mouth daily.   aspirin 81 MG EC tablet Take 1 tablet (81 mg total) by mouth daily.   cephALEXin 500 MG capsule Commonly known as: KEFLEX Take 1 capsule (500 mg total) by mouth 3 (three) times daily for 5 days.   ezetimibe 10 MG tablet Commonly known as: ZETIA Take 1 tablet (10 mg total) by mouth daily. (Please make appt w/ new PCP)   finasteride 1 MG tablet Commonly known as: PROPECIA TAKE ONE (1) TABLET EACH DAY   FLUoxetine 20 MG capsule Commonly known as: PROZAC Take 3 capsules (60 mg total) by mouth daily. (Please make appt w/ new PCP)   fluticasone 50 MCG/ACT nasal spray Commonly known as: FLONASE USE 1 TO 2 SPRAYS IN EACH NOSTRIL DAILY   gabapentin 100 MG capsule Commonly known as: NEURONTIN Take 3 capsules (300 mg total) by mouth 3 (three) times daily.   ibuprofen 800 MG tablet Commonly known as: ADVIL Take 1 tablet (800 mg total) by mouth every 8 (eight) hours as needed.   lisinopril 2.5 MG tablet Commonly known as: ZESTRIL Take 1 tablet (2.5 mg total) by mouth 2 (two) times daily. (Needs to be seen before next refill)   Melatonin 3 MG Tabs Take 1 tablet (3 mg total) by mouth at bedtime.   metFORMIN 500 MG 24 hr tablet Commonly known as: GLUCOPHAGE-XR Take 1,000 mg by mouth 2 (two) times daily with a meal.   multivitamin with minerals Tabs tablet Take 1 tablet by mouth daily.   Olopatadine HCl 0.2 % Soln Place 1 drop into both eyes daily.   Pazeo 0.7 % Soln Generic drug: Olopatadine HCl   omega-3 acid ethyl esters 1 g capsule Commonly known as: LOVAZA TAKE 4 CAPSULES DAILY   OSTEO BI-FLEX JOINT SHIELD PO Take 2 tablets by  mouth daily.   pantoprazole 40 MG tablet Commonly known as: PROTONIX Take 1 tablet (40 mg total) by mouth daily.   protein supplement shake Liqd Commonly known as: PREMIER PROTEIN Take 325 mLs (11 oz total) by mouth 3 (three) times daily with meals.   vitamin C 1000 MG tablet Take 1,000 mg by mouth daily.       Procedures/Studies: Dg Chest 2 View  Result Date: 11/09/2018 CLINICAL DATA:  Weakness with chills last night and vomiting  x2. EXAM: CHEST - 2 VIEW COMPARISON:  04/15/2013 FINDINGS: Lungs are adequately inflated. There is no focal airspace consolidation or effusion. Cardiomediastinal silhouette and remainder of the exam is unchanged to include partial fusion of 2 lower thoracic vertebral bodies. IMPRESSION: No active cardiopulmonary disease. Electronically Signed   By: Marin Olp M.D.   On: 11/09/2018 14:11     Subjective: Pt says he is feeling a lot better today, he is eating and drinking well with no problems.   Discharge Exam: Vitals:   11/12/18 0455 11/12/18 0801  BP: (!) 181/89   Pulse: (!) 55   Resp: 16   Temp: 98.1 F (36.7 C)   SpO2: 100% 99%   Vitals:   11/11/18 2118 11/11/18 2331 11/12/18 0455 11/12/18 0801  BP: (!) 166/83  (!) 181/89   Pulse: (!) 59  (!) 55   Resp: 17  16   Temp: 98 F (36.7 C)  98.1 F (36.7 C)   TempSrc: Oral  Oral   SpO2: 96% 96% 100% 99%  Weight:      Height:        General: Pt is alert, awake, not in acute distress Cardiovascular: RRR, S1/S2 +, no rubs, no gallops Respiratory: CTA bilaterally, no wheezing, no rhonchi Abdominal: Soft, NT, ND, bowel sounds + Extremities: no edema, no cyanosis   The results of significant diagnostics from this hospitalization (including imaging, microbiology, ancillary and laboratory) are listed below for reference.     Microbiology: Recent Results (from the past 240 hour(s))  SARS Coronavirus 2 [facility] order, Performed in [facility] hospital lab) Nasopharyngeal Nasopharyngeal Swab      Status: None   Collection Time: 11/09/18  2:25 PM   Specimen: Nasopharyngeal Swab  Result Value Ref Range Status   SARS Coronavirus 2 NEGATIVE NEGATIVE Final    Comment: (NOTE) If result is NEGATIVE SARS-CoV-2 target nucleic acids are NOT DETECTED. The SARS-CoV-2 RNA is generally detectable in upper and lower  respiratory specimens during the acute phase of infection. The lowest  concentration of SARS-CoV-2 viral copies this assay can detect is 250  copies / mL. A negative result does not preclude SARS-CoV-2 infection  and should not be used as the sole basis for treatment or other  patient management decisions.  A negative result may occur with  improper specimen collection / handling, submission of specimen other  than nasopharyngeal swab, presence of viral mutation(s) within the  areas targeted by this assay, and inadequate number of viral copies  (<250 copies / mL). A negative result must be combined with clinical  observations, patient history, and epidemiological information. If result is POSITIVE SARS-CoV-2 target nucleic acids are DETECTED. The SARS-CoV-2 RNA is generally detectable in upper and lower  respiratory specimens dur ing the acute phase of infection.  Positive  results are indicative of active infection with SARS-CoV-2.  Clinical  correlation with patient history and other diagnostic information is  necessary to determine patient infection status.  Positive results do  not rule out bacterial infection or co-infection with other viruses. If result is PRESUMPTIVE POSTIVE SARS-CoV-2 nucleic acids MAY BE PRESENT.   A presumptive positive result was obtained on the submitted specimen  and confirmed on repeat testing.  While 2019 novel coronavirus  (SARS-CoV-2) nucleic acids may be present in the submitted sample  additional confirmatory testing may be necessary for epidemiological  and / or clinical management purposes  to differentiate between  SARS-CoV-2 and  other Sarbecovirus currently known to infect humans.  If clinically indicated additional testing with an alternate test  methodology [phone]) is advised. The SARS-CoV-2 RNA is generally  detectable in upper and lower respiratory sp ecimens during the acute  phase of infection. The expected result is Negative. Fact Sheet for Patients:  StrictlyIdeas.no Fact Sheet for Healthcare Providers: BankingDealers.co.za This test is not yet approved or cleared by the [place] FDA and has been authorized for detection and/or diagnosis of SARS-CoV-2 by FDA under an Emergency Use Authorization (EUA).  This EUA will remain in effect (meaning this test can be used) for the duration of the COVID-19 declaration under Section 564(b)(1) of the Act, 21 U.S.C. section 360bbb-3(b)(1), unless the authorization is terminated or revoked sooner. Performed at [facility], [address] [place], [place] [address]   Blood Culture (routine x 2)     Status: Abnormal   Collection Time: 11/09/18  2:39 PM   Specimen: BLOOD  Result Value Ref Range Status   Specimen Description   Final    BLOOD LEFT ANTECUBITAL Performed at [facility] [address] [place], [place] [address]    Special Requests   Final    BOTTLES DRAWN AEROBIC AND ANAEROBIC Blood Culture adequate volume Performed at [facility], [place] [address], [place], [place] [address]    Culture  Setup Time   Final    GRAM NEGATIVE RODS Gram Stain Report Called to,Read Back By and Verified With: HARRIS,B @ [id number] ON 11/10/18 BY JUW ANAEROBIC BOTTLE ONLY GS DONE @ [facility] CRITICAL RESULT CALLED TO, READ BACK BY AND VERIFIED WITH: PHARMD STEVEN H. [phone] FCP GRAM NEGATIVE RODS AEROBIC BOTTLE ONLY  RESULT PREV. CALLED Performed at [facility], [address], [place], [place] [address]    Culture KLEBSIELLA PNEUMONIAE (A)  Final   Report Status 11/12/2018 FINAL  Final   Organism ID, Bacteria  KLEBSIELLA PNEUMONIAE  Final      Susceptibility   Klebsiella pneumoniae - MIC*    AMPICILLIN >=32 RESISTANT Resistant     CEFAZOLIN <=4 SENSITIVE Sensitive     CEFEPIME <=1 SENSITIVE Sensitive     CEFTAZIDIME <=1 SENSITIVE Sensitive     CEFTRIAXONE <=1 SENSITIVE Sensitive     CIPROFLOXACIN <=0.25 SENSITIVE Sensitive     GENTAMICIN <=1 SENSITIVE Sensitive     IMIPENEM 0.5 SENSITIVE Sensitive     TRIMETH/SULFA <=20 SENSITIVE Sensitive     AMPICILLIN/SULBACTAM >=32 RESISTANT Resistant     PIP/TAZO <=4 SENSITIVE Sensitive     Extended ESBL NEGATIVE Sensitive     * KLEBSIELLA PNEUMONIAE  Blood Culture ID Panel (Reflexed)     Status: Abnormal   Collection Time: 11/09/18  2:39 PM  Result Value Ref Range Status   Enterococcus species NOT DETECTED NOT DETECTED Final   Listeria monocytogenes NOT DETECTED NOT DETECTED Final   Staphylococcus species NOT DETECTED NOT DETECTED Final   Staphylococcus aureus (BCID) NOT DETECTED NOT DETECTED Final   Streptococcus species NOT DETECTED NOT DETECTED Final   Streptococcus agalactiae NOT DETECTED NOT DETECTED Final   Streptococcus pneumoniae NOT DETECTED NOT DETECTED Final   Streptococcus pyogenes NOT DETECTED NOT DETECTED Final   Acinetobacter baumannii NOT DETECTED NOT DETECTED Final   Enterobacteriaceae species DETECTED (A) NOT DETECTED Final    Comment: Enterobacteriaceae represent a large family of gram-negative bacteria, not a single organism. CRITICAL RESULT CALLED TO, READ BACK BY AND VERIFIED WITH: PHARMD STEVEN H. [id number] FCP    Enterobacter cloacae complex NOT DETECTED NOT DETECTED Final  Escherichia coli NOT DETECTED NOT DETECTED Final   Klebsiella oxytoca NOT DETECTED NOT DETECTED Final   Klebsiella pneumoniae DETECTED (A) NOT DETECTED Final    Comment: CRITICAL RESULT CALLED TO, READ BACK BY AND VERIFIED WITH: PHARMD STEVEN H. [phone] FCP    Proteus species NOT DETECTED NOT DETECTED Final   Serratia marcescens NOT DETECTED  NOT DETECTED Final   Carbapenem resistance NOT DETECTED NOT DETECTED Final   Haemophilus influenzae NOT DETECTED NOT DETECTED Final   Neisseria meningitidis NOT DETECTED NOT DETECTED Final   Pseudomonas aeruginosa NOT DETECTED NOT DETECTED Final   Candida albicans NOT DETECTED NOT DETECTED Final   Candida glabrata NOT DETECTED NOT DETECTED Final   Candida krusei NOT DETECTED NOT DETECTED Final   Candida parapsilosis NOT DETECTED NOT DETECTED Final   Candida tropicalis NOT DETECTED NOT DETECTED Final    Comment: Performed at [facility], [address], [place], [place] [address]  Blood Culture (routine x 2)     Status: Abnormal   Collection Time: 11/09/18  2:44 PM   Specimen: BLOOD LEFT ARM  Result Value Ref Range Status   Specimen Description   Final    BLOOD LEFT ARM Performed at [facility], [place] [address], [place], [place] [address]    Special Requests   Final    BOTTLES DRAWN AEROBIC AND ANAEROBIC Blood Culture adequate volume Performed at [facility], [place] [address], [place], [place] [address]    Culture  Setup Time   Final    GRAM NEGATIVE RODS Gram Stain Report Called to,Read Back By and Verified With: HARRIS,B @ [id number] ON 11/10/18 BY JUW ANAEROBIC BLT GS DONE @ [facility] GRAM NEGATIVE RODS AEROBIC BOTTLE ONLY  RESULT PREV. CALLED Performed at [facility], [address], [place], [place] [address]    Culture (A)  Final    KLEBSIELLA PNEUMONIAE SUSCEPTIBILITIES PERFORMED ON PREVIOUS CULTURE WITHIN THE LAST 5 DAYS. Performed at [facility], [place] [address], [place], [place] [address]    Report Status 11/12/2018 FINAL  Final  Urine culture     Status: Abnormal   Collection Time: 11/09/18  3:00 PM   Specimen: In/Out Cath Urine  Result Value Ref Range Status   Specimen Description   Final    IN/OUT CATH URINE Performed at [facility], [address], [place], [place] [address]    Special Requests   Final    NONE Performed at [facility],  [address] [place],  [address]    Culture >=100,000 COLONIES/mL KLEBSIELLA PNEUMONIAE (A)  Final   Report Status 11/12/2018 FINAL  Final   Organism ID, Bacteria KLEBSIELLA PNEUMONIAE (A)  Final      Susceptibility   Klebsiella pneumoniae - MIC*    AMPICILLIN >=32 RESISTANT Resistant     CEFAZOLIN <=4 SENSITIVE Sensitive     CEFTRIAXONE <=1 SENSITIVE Sensitive     CIPROFLOXACIN <=0.25 SENSITIVE Sensitive     GENTAMICIN <=1 SENSITIVE Sensitive     IMIPENEM 0.5 SENSITIVE Sensitive     NITROFURANTOIN 64 INTERMEDIATE Intermediate     TRIMETH/SULFA <=20 SENSITIVE Sensitive     AMPICILLIN/SULBACTAM >=32 RESISTANT Resistant     PIP/TAZO <=4 SENSITIVE Sensitive     Extended ESBL NEGATIVE Sensitive     * >=100,000 COLONIES/mL KLEBSIELLA PNEUMONIAE     Labs: BNP (last 3 results) No results for input(s): BNP in the last 8760 hours. Basic Metabolic Panel: Recent Labs  Lab 11/09/18 1316  11/10/18 0203 11/11/18 0617  NA 133* 137 143  K 3.6 3.1* 3.5  CL 102 111 114*  CO2 21* 20* 23  GLUCOSE 227* 162* 164*  BUN 39* 32* 14  CREATININE 1.37* 0.85 0.50*  CALCIUM 8.3* 7.0* 7.9*  MG  --  1.6* 2.0   Liver Function Tests: Recent Labs  Lab 11/09/18 1316  AST 29  ALT 28  ALKPHOS 42  BILITOT 0.2*  PROT 6.3*  ALBUMIN 3.5   Recent Labs  Lab 11/09/18 1316  LIPASE 19   No results for input(s): AMMONIA in the last 168 hours. CBC: Recent Labs  Lab 11/09/18 1316 11/10/18 0203 11/11/18 0617  WBC 18.0* 16.2* 11.0*  NEUTROABS 16.7*  --   --   HGB 13.5 11.1* 11.3*  HCT 41.2 34.2* 35.8*  MCV 91.4 93.4 93.2  PLT 120* 84* 76*   Cardiac Enzymes: No results for input(s): CKTOTAL, CKMB, CKMBINDEX, TROPONINI in the last 168 hours. BNP: Invalid input(s): POCBNP CBG: Recent Labs  Lab 11/10/18 2145 11/11/18 0826 11/11/18 1141 11/11/18 1600 11/11/18 2122  GLUCAP 116* 143* 161* 119* 173*   D-Dimer No results for input(s): DDIMER in the last 72 hours. Hgb A1c No results for  input(s): HGBA1C in the last 72 hours. Lipid Profile No results for input(s): CHOL, HDL, LDLCALC, TRIG, CHOLHDL, LDLDIRECT in the last 72 hours. Thyroid function studies No results for input(s): TSH, T4TOTAL, T3FREE, THYROIDAB in the last 72 hours.  Invalid input(s): FREET3 Anemia work up No results for input(s): VITAMINB12, FOLATE, FERRITIN, TIBC, IRON, RETICCTPCT in the last 72 hours. Urinalysis    Component Value Date/Time   COLORURINE YELLOW 11/09/2018 1500   APPEARANCEUR HAZY (A) 11/09/2018 1500   APPEARANCEUR Clear 03/03/2016 1703   LABSPEC 1.016 11/09/2018 1500   PHURINE 5.0 11/09/2018 1500   GLUCOSEU NEGATIVE 11/09/2018 1500   HGBUR NEGATIVE 11/09/2018 1500   BILIRUBINUR NEGATIVE 11/09/2018 1500   BILIRUBINUR Negative 03/03/2016 1703   KETONESUR NEGATIVE 11/09/2018 1500   PROTEINUR NEGATIVE 11/09/2018 1500   UROBILINOGEN negative 05/14/2014 1409   NITRITE NEGATIVE 11/09/2018 1500   LEUKOCYTESUR TRACE (A) 11/09/2018 1500   Sepsis Labs Invalid input(s): PROCALCITONIN,  WBC,  LACTICIDVEN Microbiology Recent Results (from the past 240 hour(s))  SARS Coronavirus 2 [facility] order, Performed in [facility] hospital lab) Nasopharyngeal Nasopharyngeal Swab     Status: None   Collection Time: 11/09/18  2:25 PM   Specimen: Nasopharyngeal Swab  Result Value Ref Range Status   SARS Coronavirus 2 NEGATIVE NEGATIVE Final    Comment: (NOTE) If result is NEGATIVE SARS-CoV-2 target nucleic acids are NOT DETECTED. The SARS-CoV-2 RNA is generally detectable in upper and lower  respiratory specimens during the acute phase of infection. The lowest  concentration of SARS-CoV-2 viral copies this assay can detect is 250  copies / mL. A negative result does not preclude SARS-CoV-2 infection  and should not be used as the sole basis for treatment or other  patient management decisions.  A negative result may occur with  improper specimen collection / handling, submission of specimen other   than nasopharyngeal swab, presence of viral mutation(s) within the  areas targeted by this assay, and inadequate number of viral copies  (<250 copies / mL). A negative result must be combined with clinical  observations, patient history, and epidemiological information. If result is POSITIVE SARS-CoV-2 target nucleic acids are DETECTED. The SARS-CoV-2 RNA is generally detectable in upper and lower  respiratory specimens dur ing the acute phase of  infection.  Positive  results are indicative of active infection with SARS-CoV-2.  Clinical  correlation with patient history and other diagnostic information is  necessary to determine patient infection status.  Positive results do  not rule out bacterial infection or co-infection with other viruses. If result is PRESUMPTIVE POSTIVE SARS-CoV-2 nucleic acids MAY BE PRESENT.   A presumptive positive result was obtained on the submitted specimen  and confirmed on repeat testing.  While 2019 novel coronavirus  (SARS-CoV-2) nucleic acids may be present in the submitted sample  additional confirmatory testing may be necessary for epidemiological  and / or clinical management purposes  to differentiate between  SARS-CoV-2 and other Sarbecovirus currently known to infect humans.  If clinically indicated additional testing with an alternate test  methodology [phone]) is advised. The SARS-CoV-2 RNA is generally  detectable in upper and lower respiratory sp ecimens during the acute  phase of infection. The expected result is Negative. Fact Sheet for Patients:  StrictlyIdeas.no Fact Sheet for Healthcare Providers: BankingDealers.co.za This test is not yet approved or cleared by the [place] FDA and has been authorized for detection and/or diagnosis of SARS-CoV-2 by FDA under an Emergency Use Authorization (EUA).  This EUA will remain in effect (meaning this test can be used) for the duration of  the COVID-19 declaration under Section 564(b)(1) of the Act, 21 U.S.C. section 360bbb-3(b)(1), unless the authorization is terminated or revoked sooner. Performed at [facility], [address], [place], [place] [address]   Blood Culture (routine x 2)     Status: Abnormal   Collection Time: 11/09/18  2:39 PM   Specimen: BLOOD  Result Value Ref Range Status   Specimen Description   Final    BLOOD LEFT ANTECUBITAL Performed at [facility] [address], [place], [place] [address]    Special Requests   Final    BOTTLES DRAWN AEROBIC AND ANAEROBIC Blood Culture adequate volume Performed at [facility], [place] [address] [place], [place] [address]    Culture  Setup Time   Final    GRAM NEGATIVE RODS Gram Stain Report Called to,Read Back By and Verified With: HARRIS,B @ [id number] ON 11/10/18 BY JUW ANAEROBIC BOTTLE ONLY GS DONE @ [facility] CRITICAL RESULT CALLED TO, READ BACK BY AND VERIFIED WITH: PHARMD STEVEN H. [id number] FCP GRAM NEGATIVE RODS AEROBIC BOTTLE ONLY  RESULT PREV. CALLED Performed at [facility], [address], [place],  [address]    Culture KLEBSIELLA PNEUMONIAE (A)  Final   Report Status 11/12/2018 FINAL  Final   Organism ID, Bacteria KLEBSIELLA PNEUMONIAE  Final      Susceptibility   Klebsiella pneumoniae - MIC*    AMPICILLIN >=32 RESISTANT Resistant     CEFAZOLIN <=4 SENSITIVE Sensitive     CEFEPIME <=1 SENSITIVE Sensitive     CEFTAZIDIME <=1 SENSITIVE Sensitive     CEFTRIAXONE <=1 SENSITIVE Sensitive     CIPROFLOXACIN <=0.25 SENSITIVE Sensitive     GENTAMICIN <=1 SENSITIVE Sensitive     IMIPENEM 0.5 SENSITIVE Sensitive     TRIMETH/SULFA <=20 SENSITIVE Sensitive     AMPICILLIN/SULBACTAM >=32 RESISTANT Resistant     PIP/TAZO <=4 SENSITIVE Sensitive     Extended ESBL NEGATIVE Sensitive     * KLEBSIELLA PNEUMONIAE  Blood Culture ID Panel (Reflexed)     Status: Abnormal   Collection Time: 11/09/18  2:39 PM  Result Value Ref Range Status    Enterococcus species NOT DETECTED NOT DETECTED Final  Listeria monocytogenes NOT DETECTED NOT DETECTED Final   Staphylococcus species NOT DETECTED NOT DETECTED Final   Staphylococcus aureus (BCID) NOT DETECTED NOT DETECTED Final   Streptococcus species NOT DETECTED NOT DETECTED Final   Streptococcus agalactiae NOT DETECTED NOT DETECTED Final   Streptococcus pneumoniae NOT DETECTED NOT DETECTED Final   Streptococcus pyogenes NOT DETECTED NOT DETECTED Final   Acinetobacter baumannii NOT DETECTED NOT DETECTED Final   Enterobacteriaceae species DETECTED (A) NOT DETECTED Final    Comment: Enterobacteriaceae represent a large family of gram-negative bacteria, not a single organism. CRITICAL RESULT CALLED TO, READ BACK BY AND VERIFIED WITH: PHARMD STEVEN H. [id number] FCP    Enterobacter cloacae complex NOT DETECTED NOT DETECTED Final   Escherichia coli NOT DETECTED NOT DETECTED Final   Klebsiella oxytoca NOT DETECTED NOT DETECTED Final   Klebsiella pneumoniae DETECTED (A) NOT DETECTED Final    Comment: CRITICAL RESULT CALLED TO, READ BACK BY AND VERIFIED WITH: PHARMD STEVEN H. [phone] FCP    Proteus species NOT DETECTED NOT DETECTED Final   Serratia marcescens NOT DETECTED NOT DETECTED Final   Carbapenem resistance NOT DETECTED NOT DETECTED Final   Haemophilus influenzae NOT DETECTED NOT DETECTED Final   Neisseria meningitidis NOT DETECTED NOT DETECTED Final   Pseudomonas aeruginosa NOT DETECTED NOT DETECTED Final   Candida albicans NOT DETECTED NOT DETECTED Final   Candida glabrata NOT DETECTED NOT DETECTED Final   Candida krusei NOT DETECTED NOT DETECTED Final   Candida parapsilosis NOT DETECTED NOT DETECTED Final   Candida tropicalis NOT DETECTED NOT DETECTED Final    Comment: Performed at [facility], [address], [place], [place] [address]  Blood Culture (routine x 2)     Status: Abnormal   Collection Time: 11/09/18  2:44 PM   Specimen: BLOOD LEFT ARM  Result Value  Ref Range Status   Specimen Description   Final    BLOOD LEFT ARM Performed at [facility], [place] [address], [place], [place] [address]    Special Requests   Final    BOTTLES DRAWN AEROBIC AND ANAEROBIC Blood Culture adequate volume Performed at [facility], [place] [address] [place], [place] [address]    Culture  Setup Time   Final    GRAM NEGATIVE RODS Gram Stain Report Called to,Read Back By and Verified With: HARRIS,B @ [id number] ON 11/10/18 BY JUW ANAEROBIC BLT GS DONE @ [facility] GRAM NEGATIVE RODS AEROBIC BOTTLE ONLY  RESULT PREV. CALLED Performed at [facility], [address] [place], [place] [address]    Culture (A)  Final    KLEBSIELLA PNEUMONIAE SUSCEPTIBILITIES PERFORMED ON PREVIOUS CULTURE WITHIN THE LAST 5 DAYS. Performed at [facility], [place] [address] [place], [place] [address]    Report Status 11/12/2018 FINAL  Final  Urine culture     Status: Abnormal   Collection Time: 11/09/18  3:00 PM   Specimen: In/Out Cath Urine  Result Value Ref Range Status   Specimen Description   Final    IN/OUT CATH URINE Performed at [facility], [address] [place], [place] [address]    Special Requests   Final    NONE Performed at [facility], [address], [place], [place] [address]    Culture >=100,000 COLONIES/mL KLEBSIELLA PNEUMONIAE (A)  Final   Report Status 11/12/2018 FINAL  Final   Organism ID, Bacteria KLEBSIELLA PNEUMONIAE (A)  Final      Susceptibility   Klebsiella pneumoniae - MIC*  AMPICILLIN >=32 RESISTANT Resistant     CEFAZOLIN <=4 SENSITIVE Sensitive     CEFTRIAXONE <=1 SENSITIVE Sensitive     CIPROFLOXACIN <=0.25 SENSITIVE Sensitive     GENTAMICIN <=1 SENSITIVE Sensitive     IMIPENEM 0.5 SENSITIVE Sensitive     NITROFURANTOIN 64 INTERMEDIATE Intermediate     TRIMETH/SULFA <=20 SENSITIVE Sensitive     AMPICILLIN/SULBACTAM >=32 RESISTANT Resistant     PIP/TAZO <=4 SENSITIVE Sensitive     Extended ESBL NEGATIVE Sensitive     * >=100,000  COLONIES/mL KLEBSIELLA PNEUMONIAE    Time coordinating discharge: 31 minutes   SIGNED:  Irwin Brakeman, MD  [facility] 11/12/2018, 11:52 AM How to contact the [facility] Attending or Consulting provider Person or covering provider during after hours [place], for this patient?  1. Check the care team in [facility] and look for a) attending/consulting [facility] provider listed and b) the [facility] team listed 2. Log into [email or web address] and use [facility]'s universal password to access. If you do not have the password, please contact the hospital operator. 3. Locate the [facility] provider you are looking for under [facility] and page to a number that you can be directly reached. 4. If you still have difficulty reaching the provider, please page the [facility] (Director on Call) for the Hospitalists listed on amion for assistance.

## 2018-11-12 NOTE — Discharge Instructions (Signed)
IMPORTANT INFORMATION: PAY CLOSE ATTENTION   PHYSICIAN DISCHARGE INSTRUCTIONS  Follow with Primary care provider  Rakes, Linda M, FNP  and other consultants as instructed by your Hospitalist Physician  SEEK MEDICAL CARE OR RETURN TO EMERGENCY ROOM IF SYMPTOMS COME BACK, WORSEN OR NEW PROBLEM DEVELOPS   Please note: You were cared for by a hospitalist during your hospital stay. Every effort will be made to forward records to your primary care provider.  You can request that your primary care provider send for your hospital records if they have not received them.  Once you are discharged, your primary care physician will handle any further medical issues. Please note that NO REFILLS for any discharge medications will be authorized once you are discharged, as it is imperative that you return to your primary care physician (or establish a relationship with a primary care physician if you do not have one) for your post hospital discharge needs so that they can reassess your need for medications and monitor your lab values.  Please get a complete blood count and chemistry panel checked by your Primary MD at your next visit, and again as instructed by your Primary MD.  Get Medicines reviewed and adjusted: Please take all your medications with you for your next visit with your Primary MD  Laboratory/radiological data: Please request your Primary MD to go over all hospital tests and procedure/radiological results at the follow up, please ask your primary care provider to get all Hospital records sent to his/her office.  In some cases, they will be blood work, cultures and biopsy results pending at the time of your discharge. Please request that your primary care provider follow up on these results.  If you are diabetic, please bring your blood sugar readings with you to your follow up appointment with primary care.    Please call and make your follow up appointments as soon as possible.    Also Note  the following: If you experience worsening of your admission symptoms, develop shortness of breath, life threatening emergency, suicidal or homicidal thoughts you must seek medical attention immediately by calling 911 or calling your MD immediately  if symptoms less severe.  You must read complete instructions/literature along with all the possible adverse reactions/side effects for all the Medicines you take and that have been prescribed to you. Take any new Medicines after you have completely understood and accpet all the possible adverse reactions/side effects.   Do not drive when taking Pain medications or sleeping medications (Benzodiazepines)  Do not take more than prescribed Pain, Sleep and Anxiety Medications. It is not advisable to combine anxiety,sleep and pain medications without talking with your primary care practitioner  Special Instructions: If you have smoked or chewed Tobacco  in the last 2 yrs please stop smoking, stop any regular Alcohol  and or any Recreational drug use.  Wear Seat belts while driving.  Do not drive if taking any narcotic, mind altering or controlled substances or recreational drugs or alcohol.       

## 2018-11-12 NOTE — Plan of Care (Signed)
  Problem: Education: Goal: Knowledge of General Education information will improve Description: Including pain rating scale, medication(s)/side effects and non-pharmacologic comfort measures Outcome: Progressing   Problem: Health Behavior/Discharge Planning: Goal: Ability to manage health-related needs will improve Outcome: Progressing   Problem: Clinical Measurements: Goal: Ability to maintain clinical measurements within normal limits will improve Outcome: Progressing Goal: Will remain free from infection Outcome: Progressing Goal: Diagnostic test results will improve Outcome: Progressing Goal: Respiratory complications will improve Outcome: Progressing Goal: Cardiovascular complication will be avoided Outcome: Progressing   Problem: Activity: Goal: Risk for activity intolerance will decrease Outcome: Progressing   Problem: Elimination: Goal: Will not experience complications related to bowel motility Outcome: Progressing   Problem: Urinary Elimination: Goal: Signs and symptoms of infection will decrease Outcome: Progressing

## 2018-11-18 ENCOUNTER — Other Ambulatory Visit: Payer: Medicare Other

## 2018-11-18 ENCOUNTER — Other Ambulatory Visit: Payer: Self-pay | Admitting: Family Medicine

## 2018-11-18 DIAGNOSIS — R300 Dysuria: Secondary | ICD-10-CM

## 2018-11-18 LAB — URINALYSIS, COMPLETE
Bilirubin, UA: NEGATIVE
Glucose, UA: NEGATIVE
Ketones, UA: NEGATIVE
Leukocytes,UA: NEGATIVE
Nitrite, UA: NEGATIVE
Protein,UA: NEGATIVE
Specific Gravity, UA: 1.025 (ref 1.005–1.030)
Urobilinogen, Ur: 0.2 mg/dL (ref 0.2–1.0)
pH, UA: 6.5 (ref 5.0–7.5)

## 2018-11-18 LAB — MICROSCOPIC EXAMINATION
Bacteria, UA: NONE SEEN
Renal Epithel, UA: NONE SEEN /hpf

## 2018-11-20 LAB — SPECIMEN STATUS REPORT

## 2018-11-20 LAB — URINE CULTURE

## 2018-11-22 ENCOUNTER — Encounter: Payer: Self-pay | Admitting: Family Medicine

## 2018-11-22 ENCOUNTER — Ambulatory Visit (INDEPENDENT_AMBULATORY_CARE_PROVIDER_SITE_OTHER): Payer: Medicare Other | Admitting: Family Medicine

## 2018-11-22 DIAGNOSIS — N390 Urinary tract infection, site not specified: Secondary | ICD-10-CM

## 2018-11-22 DIAGNOSIS — E1159 Type 2 diabetes mellitus with other circulatory complications: Secondary | ICD-10-CM

## 2018-11-22 DIAGNOSIS — E1141 Type 2 diabetes mellitus with diabetic mononeuropathy: Secondary | ICD-10-CM

## 2018-11-22 DIAGNOSIS — D696 Thrombocytopenia, unspecified: Secondary | ICD-10-CM | POA: Diagnosis not present

## 2018-11-22 DIAGNOSIS — A419 Sepsis, unspecified organism: Secondary | ICD-10-CM

## 2018-11-22 DIAGNOSIS — Z09 Encounter for follow-up examination after completed treatment for conditions other than malignant neoplasm: Secondary | ICD-10-CM

## 2018-11-22 DIAGNOSIS — I10 Essential (primary) hypertension: Secondary | ICD-10-CM
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
## 2018-11-28 ENCOUNTER — Inpatient Hospital Stay (HOSPITAL_COMMUNITY): Payer: Medicare Other

## 2018-11-28 ENCOUNTER — Encounter (HOSPITAL_COMMUNITY): Payer: Self-pay

## 2018-11-28 ENCOUNTER — Other Ambulatory Visit: Payer: Self-pay | Admitting: Family Medicine

## 2018-11-28 ENCOUNTER — Other Ambulatory Visit: Payer: Self-pay

## 2018-11-28 ENCOUNTER — Inpatient Hospital Stay (HOSPITAL_COMMUNITY): Payer: Medicare Other | Attending: Hematology | Admitting: Hematology

## 2018-11-28 ENCOUNTER — Encounter (HOSPITAL_COMMUNITY): Payer: Self-pay | Admitting: Hematology

## 2018-11-28 VITALS — BP 150/91 | HR 76 | Temp 97.5°F | Resp 16 | Ht 72.0 in | Wt 192.7 lb

## 2018-11-28 DIAGNOSIS — D696 Thrombocytopenia, unspecified: Secondary | ICD-10-CM

## 2018-11-28 DIAGNOSIS — E785 Hyperlipidemia, unspecified: Secondary | ICD-10-CM

## 2018-11-28 DIAGNOSIS — E1169 Type 2 diabetes mellitus with other specified complication: Secondary | ICD-10-CM

## 2018-11-28 LAB — IRON AND TIBC
Iron: 110 ug/dL (ref 45–182)
Saturation Ratios: 37 % (ref 17.9–39.5)
TIBC: 301 ug/dL (ref 250–450)
UIBC: 191 ug/dL

## 2018-11-28 LAB — CBC WITH DIFFERENTIAL/PLATELET
Abs Immature Granulocytes: 0 10*3/uL (ref 0.00–0.07)
Band Neutrophils: 0 %
Basophils Absolute: 0 10*3/uL (ref 0.0–0.1)
Basophils Relative: 0 %
Blasts: 0 %
Eosinophils Absolute: 0.3 10*3/uL (ref 0.0–0.5)
Eosinophils Relative: 4 %
HCT: 42.8 % (ref 39.0–52.0)
Hemoglobin: 13.9 g/dL (ref 13.0–17.0)
Lymphocytes Relative: 38 %
Lymphs Abs: 2.4 10*3/uL (ref 0.7–4.0)
MCH: 29.5 pg (ref 26.0–34.0)
MCHC: 32.5 g/dL (ref 30.0–36.0)
MCV: 90.9 fL (ref 80.0–100.0)
Metamyelocytes Relative: 0 %
Monocytes Absolute: 0.6 10*3/uL (ref 0.1–1.0)
Monocytes Relative: 9 %
Myelocytes: 0 %
Neutro Abs: 3 10*3/uL (ref 1.7–7.7)
Neutrophils Relative %: 49 %
Other: 0 %
Platelets: 238 10*3/uL (ref 150–400)
Promyelocytes Relative: 0 %
RBC: 4.71 MIL/uL (ref 4.22–5.81)
RDW: 13.9 % (ref 11.5–15.5)
WBC: 6.3 10*3/uL (ref 4.0–10.5)
nRBC: 0 % (ref 0.0–0.2)
nRBC: 0 /100 WBC

## 2018-11-28 LAB — HEPATITIS B SURFACE ANTIBODY,QUALITATIVE: Hep B S Ab: NONREACTIVE

## 2018-11-28 LAB — SEDIMENTATION RATE: Sed Rate: 13 mm/hr (ref 0–16)

## 2018-11-28 LAB — HEPATITIS B CORE ANTIBODY, TOTAL: Hep B Core Total Ab: NONREACTIVE

## 2018-11-28 LAB — FOLATE: Folate: 21 ng/mL (ref >5.9)

## 2018-11-28 LAB — HEPATITIS PANEL, ACUTE
HCV Ab: NONREACTIVE
Hep A IgM: NONREACTIVE
Hep B C IgM: NONREACTIVE
Hepatitis B Surface Ag: NONREACTIVE

## 2018-11-28 LAB — HEPATITIS B SURFACE ANTIGEN: Hepatitis B Surface Ag: NONREACTIVE

## 2018-11-28 LAB — SAVE SMEAR(SSMR), FOR PROVIDER SLIDE REVIEW

## 2018-11-28 LAB — VITAMIN B12: Vitamin B-12: 622 pg/mL (ref 180–914)

## 2018-11-28 LAB — FERRITIN: Ferritin: 235 ng/mL (ref 24–336)

## 2018-11-28 LAB — LACTATE DEHYDROGENASE: LDH: 110 U/L (ref 98–192)

## 2018-11-28 NOTE — Progress Notes (Signed)
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 70 - 99 mg/dL   BUN 14 8 - 23 mg/dL   Creatinine, Ser 0.50 (L) 0.61 - 1.24 mg/dL   Calcium 7.9 (L) 8.9 - 10.3 mg/dL   GFR calc non Af Amer >60 >60 mL/min   GFR  calc Af Amer >60 >60 mL/min   Anion gap 6 5 - 15    Comment: Performed at [facility], [address], [place], [place] [address]  CBC     Status: Abnormal   Collection Time: 11/11/18  6:17 AM  Result Value Ref Range   WBC 11.0 (H) 4.0 - 10.5 K/uL   RBC 3.84 (L) 4.22 - 5.81 MIL/uL   Hemoglobin 11.3 (L) 13.0 - 17.0 g/dL   HCT 35.8 (L) 39.0 - 52.0 %   MCV 93.2 80.0 - 100.0 fL   MCH 29.4 26.0 -  34.0 pg   MCHC 31.6 30.0 - 36.0 g/dL   RDW 14.8 11.5 - 15.5 %   Platelets 76 (L) 150 - 400 K/uL    Comment: SPECIMEN CHECKED FOR CLOTS Immature Platelet Fraction may be clinically indicated, consider ordering this additional test [id number] CONSISTENT WITH PREVIOUS RESULT    nRBC 0.0 0.0 - 0.2 %    Comment: Performed at [facility], [address], [place], [place] [address]  Magnesium     Status: None   Collection Time: 11/11/18  6:17 AM  Result Value Ref Range   Magnesium 2.0 1.7 - 2.4 mg/dL    Comment: Performed at [facility], [address], [place], [place] [address]  Glucose, capillary     Status: Abnormal   Collection Time: 11/11/18  8:26 AM  Result Value Ref Range   Glucose-Capillary 143 (H) 70 - 99 mg/dL  Glucose, capillary     Status: Abnormal   Collection Time: 11/11/18 11:41 AM  Result Value Ref Range   Glucose-Capillary 161 (H) 70 - 99 mg/dL  Glucose, capillary     Status: Abnormal   Collection Time: 11/11/18  4:00 PM  Result Value Ref Range   Glucose-Capillary 119 (H) 70 - 99 mg/dL   Comment 1 Notify RN    Comment 2 Document in Chart   Glucose, capillary     Status: Abnormal   Collection Time: 11/11/18  9:22 PM  Result Value Ref Range   Glucose-Capillary 173 (H) 70 - 99 mg/dL   Comment 1 Notify RN   Urinalysis, Complete     Status: Abnormal   Collection Time: 11/18/18  3:00 PM  Result Value Ref Range   Specific Gravity, UA 1.025 1.005 - 1.030   pH, UA 6.5 5.0 - 7.5   Color, UA Yellow Yellow   Appearance Ur Clear Clear   Leukocytes,UA Negative Negative   Protein,UA Negative Negative/Trace   Glucose, UA Negative Negative   Ketones, UA Negative Negative   RBC, UA Trace (A) Negative   Bilirubin, UA Negative Negative   Urobilinogen, Ur 0.2 0.2 - 1.0 mg/dL   Nitrite, UA Negative Negative   Microscopic Examination See below:   Microscopic Examination     Status: Abnormal   Collection Time: 11/18/18  3:00 PM   URINE  Result Value Ref Range   WBC, UA 0-5 0  - 5 /hpf   RBC 3-10 (A) 0 - 2 /hpf   Epithelial Cells (non renal) 0-10 0 - 10 /hpf   Renal Epithel, UA None seen None seen /hpf   Mucus, UA Present Not Estab.   Bacteria, UA None seen None seen/Few  Urine Culture     Status: None   Collection Time: 11/18/18  3:05 PM   Specimen: Urine   URINE  Result Value Ref Range   Urine Culture, Routine Final report    Organism ID, Bacteria Comment  Comment: Culture shows less than 10,000 colony forming units of bacteria per milliliter of urine. This colony count is not generally considered to be clinically significant.   Specimen status report     Status: None   Collection Time: 11/18/18  3:05 PM  Result Value Ref Range   specimen status report Comment     Comment: One Specimen Identifier One Specimen Identifier The specimen received included only one patient identifier on the primary collection container.  Our laboratory accrediting agency states "All primary specimen containers must be labeled with 2 identifiers at the time of collection."     RADIOGRAPHIC STUDIES: I have personally reviewed the radiological images as listed and agreed with the findings in the report. Dg Chest 2 View  Result Date: 11/09/2018 CLINICAL DATA:  Weakness with chills last night and vomiting x2. EXAM: CHEST - 2 VIEW COMPARISON:  04/15/2013 FINDINGS: Lungs are adequately inflated. There is no focal airspace consolidation or effusion. Cardiomediastinal silhouette and remainder of the exam is unchanged to include partial fusion of 2 lower thoracic vertebral bodies. IMPRESSION: No active cardiopulmonary disease. Electronically Signed   By: Marin Olp M.D.   On: 11/09/2018 14:11    ASSESSMENT & PLAN:  No problem-specific Assessment & Plan notes found for this encounter.     All questions were answered. The patient knows to call the clinic with any problems, questions or concerns.      Donia Ast, LPN [phone] [id number] PM
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
## 2018-11-28 NOTE — Patient Instructions (Addendum)
[facility] at [facility] Discharge Instructions  You were seen today by Dr. Katragadda. He went over your history, family history and how you've been feeling lately. He will have blood drawn today. He will see you back in 3 weeks for labs and follow up.   Thank you for choosing [facility] at [facility] to provide your oncology and hematology care.  To afford each patient quality time with our provider, please arrive at least 15 minutes before your scheduled appointment time.   If you have a lab appointment with the Cancer Center please come in thru the  Main Entrance and check in at the main information desk  You need to re-schedule your appointment should you arrive 10 or more minutes late.  We strive to give you quality time with our providers, and arriving late affects you and other patients whose appointments are after yours.  Also, if you no show three or more times for appointments you may be dismissed from the clinic at the providers discretion.     Again, thank you for choosing [facility].  Our hope is that these requests will decrease the amount of time that you wait before being seen by our physicians.       _____________________________________________________________  Should you have questions after your visit to [facility], please contact our office at [phone] between the hours of 8:00 a.m. and 4:30 p.m.  Voicemails left after 4:00 p.m. will not be returned until the following business day.  For prescription refill requests, have your pharmacy contact our office and allow 72 hours.    Cancer Center Support Programs:   > Cancer Support Group  2nd Tuesday of the month 1pm-2pm, Journey Room  '  

## 2018-11-30 LAB — COPPER, SERUM: Copper: 135 ug/dL (ref 72–166)

## 2018-12-01 LAB — PROTEIN ELECTROPHORESIS, SERUM
A/G Ratio: 1.3 (ref 0.7–1.7)
Albumin ELP: 3.7 g/dL (ref 2.9–4.4)
Alpha-1-Globulin: 0.2 g/dL (ref 0.0–0.4)
Alpha-2-Globulin: 0.8 g/dL (ref 0.4–1.0)
Beta Globulin: 0.7 g/dL (ref 0.7–1.3)
Gamma Globulin: 1 g/dL (ref 0.4–1.8)
Globulin, Total: 2.8 g/dL (ref 2.2–3.9)
Total Protein ELP: 6.5 g/dL (ref 6.0–8.5)

## 2018-12-04 LAB — METHYLMALONIC ACID, SERUM: Methylmalonic Acid, Quantitative: 128 nmol/L (ref 0–378)

## 2018-12-05 ENCOUNTER — Encounter (HOSPITAL_COMMUNITY): Payer: Self-pay

## 2018-12-16 ENCOUNTER — Other Ambulatory Visit: Payer: Self-pay | Admitting: Family Medicine

## 2018-12-24 ENCOUNTER — Other Ambulatory Visit: Payer: Self-pay | Admitting: Family Medicine

## 2018-12-24 ENCOUNTER — Ambulatory Visit (HOSPITAL_COMMUNITY): Payer: Medicare Other | Admitting: Hematology

## 2018-12-31 ENCOUNTER — Other Ambulatory Visit: Payer: Self-pay | Admitting: Family Medicine

## 2018-12-31 DIAGNOSIS — K222 Esophageal obstruction: Secondary | ICD-10-CM

## 2018-12-31 DIAGNOSIS — K2100 Gastro-esophageal reflux disease with esophagitis, without bleeding: Secondary | ICD-10-CM

## 2018-12-31 DIAGNOSIS — E1159 Type 2 diabetes mellitus with other circulatory complications: Secondary | ICD-10-CM

## 2019-01-21 ENCOUNTER — Ambulatory Visit (INDEPENDENT_AMBULATORY_CARE_PROVIDER_SITE_OTHER): Payer: Medicare Other | Admitting: Family Medicine

## 2019-01-21 ENCOUNTER — Encounter: Payer: Self-pay | Admitting: Family Medicine

## 2019-01-21 DIAGNOSIS — U071 COVID-19: Secondary | ICD-10-CM | POA: Diagnosis not present

## 2019-01-21 DIAGNOSIS — R5383 Other fatigue: Secondary | ICD-10-CM

## 2019-01-21 NOTE — Patient Instructions (Addendum)
This information is directly available on the CDC website: RunningShows.co.za.html    Source:CDC Reference to specific commercial products, manufacturers, companies, or trademarks does not constitute its endorsement or recommendation by the [place], [place], or Centers for [employer] and Prevention.  This information is directly available on the CDC website: RunningShows.co.za.html    Source:CDC Reference to specific commercial products, manufacturers, companies, or trademarks does not constitute its endorsement or recommendation by the [place], [place], or Centers for [employer] and Prevention.
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
## 2019-01-22 LAB — URINALYSIS, COMPLETE
Bilirubin, UA: NEGATIVE
Glucose, UA: NEGATIVE
Ketones, UA: NEGATIVE
Leukocytes,UA: NEGATIVE
Nitrite, UA: NEGATIVE
Protein,UA: NEGATIVE
RBC, UA: NEGATIVE
Specific Gravity, UA: 1.025 (ref 1.005–1.030)
Urobilinogen, Ur: 0.2 mg/dL (ref 0.2–1.0)
pH, UA: 7 (ref 5.0–7.5)

## 2019-01-22 LAB — MICROSCOPIC EXAMINATION
Bacteria, UA: NONE SEEN
Epithelial Cells (non renal): NONE SEEN /hpf (ref 0–10)
Renal Epithel, UA: NONE SEEN /hpf
WBC, UA: NONE SEEN /hpf (ref 0–5)

## 2019-01-28 ENCOUNTER — Other Ambulatory Visit: Payer: Self-pay | Admitting: Family Medicine

## 2019-01-28 DIAGNOSIS — E1169 Type 2 diabetes mellitus with other specified complication: Secondary | ICD-10-CM

## 2019-01-28 DIAGNOSIS — E785 Hyperlipidemia, unspecified: Secondary | ICD-10-CM

## 2019-02-01 ENCOUNTER — Other Ambulatory Visit: Payer: Self-pay | Admitting: Family Medicine

## 2019-02-01 DIAGNOSIS — E1159 Type 2 diabetes mellitus with other circulatory complications: Secondary | ICD-10-CM

## 2019-02-05 ENCOUNTER — Other Ambulatory Visit: Payer: Self-pay | Admitting: Family Medicine

## 2019-02-10 NOTE — Telephone Encounter (Signed)
Do you want him to continue the IBU?

## 2019-02-12 ENCOUNTER — Other Ambulatory Visit: Payer: Self-pay | Admitting: Family Medicine

## 2019-02-12 DIAGNOSIS — I152 Hypertension secondary to endocrine disorders: Secondary | ICD-10-CM

## 2019-02-12 DIAGNOSIS — E1159 Type 2 diabetes mellitus with other circulatory complications: Secondary | ICD-10-CM

## 2019-02-18 ENCOUNTER — Other Ambulatory Visit: Payer: Self-pay | Admitting: Family Medicine

## 2019-02-18 DIAGNOSIS — N400 Enlarged prostate without lower urinary tract symptoms: Secondary | ICD-10-CM

## 2019-02-18 DIAGNOSIS — E1159 Type 2 diabetes mellitus with other circulatory complications: Secondary | ICD-10-CM

## 2019-03-09 ENCOUNTER — Other Ambulatory Visit: Payer: Self-pay | Admitting: Family Medicine

## 2019-03-09 DIAGNOSIS — E1159 Type 2 diabetes mellitus with other circulatory complications: Secondary | ICD-10-CM

## 2019-03-10 ENCOUNTER — Other Ambulatory Visit: Payer: Self-pay

## 2019-03-12 ENCOUNTER — Encounter: Payer: Self-pay | Admitting: Family Medicine

## 2019-03-12 ENCOUNTER — Other Ambulatory Visit: Payer: Self-pay

## 2019-03-12 ENCOUNTER — Ambulatory Visit (INDEPENDENT_AMBULATORY_CARE_PROVIDER_SITE_OTHER): Payer: Medicare Other | Admitting: Family Medicine

## 2019-03-12 VITALS — BP 130/75 | HR 75 | Temp 98.7°F | Ht 72.0 in | Wt 195.0 lb

## 2019-03-12 DIAGNOSIS — I10 Essential (primary) hypertension: Secondary | ICD-10-CM

## 2019-03-12 DIAGNOSIS — E1159 Type 2 diabetes mellitus with other circulatory complications: Secondary | ICD-10-CM | POA: Diagnosis not present

## 2019-03-12 DIAGNOSIS — N400 Enlarged prostate without lower urinary tract symptoms: Secondary | ICD-10-CM | POA: Diagnosis not present

## 2019-03-12 DIAGNOSIS — E1169 Type 2 diabetes mellitus with other specified complication: Secondary | ICD-10-CM | POA: Diagnosis not present

## 2019-03-12 DIAGNOSIS — E1141 Type 2 diabetes mellitus with diabetic mononeuropathy: Secondary | ICD-10-CM | POA: Diagnosis not present

## 2019-03-12 DIAGNOSIS — Z23 Encounter for immunization: Secondary | ICD-10-CM | POA: Diagnosis not present

## 2019-03-12 DIAGNOSIS — I152 Hypertension secondary to endocrine disorders: Secondary | ICD-10-CM

## 2019-03-12 DIAGNOSIS — Z0000 Encounter for general adult medical examination without abnormal findings: Secondary | ICD-10-CM

## 2019-03-12 DIAGNOSIS — E785 Hyperlipidemia, unspecified: Secondary | ICD-10-CM

## 2019-03-12 DIAGNOSIS — F339 Major depressive disorder, recurrent, unspecified: Secondary | ICD-10-CM

## 2019-03-12 LAB — BAYER DCA HB A1C WAIVED: HB A1C (BAYER DCA - WAIVED): 6.2 %

## 2019-03-12 MED ORDER — FLUOXETINE HCL 20 MG PO CAPS: 80.0000 mg | ORAL_CAPSULE | Freq: Every day | ORAL | 2 refills | Status: DC

## 2019-03-12 NOTE — Patient Instructions (Signed)

## 2019-03-12 NOTE — Progress Notes (Signed)
Subjective:  Patient ID: Melvin Gaw., male    DOB: 01-31-1951, 69 y.o.   MRN: [id number]  Patient Care Team: Baruch Gouty, FNP as PCP - General (Family Medicine)   Chief Complaint:  Medical Management of Chronic Issues, Diabetes, and Hypertension   HPI: Melvin Dettman. is a 69 y.o. male presenting on 03/12/2019 for Medical Management of Chronic Issues, Diabetes, and Hypertension   1. Hypertension associated with type 2 diabetes mellitus ([place]) Compliant with medications without associated side effects. No chest pain, palpitations, leg swelling, headaches, dizziness, or syncope.   2. Type 2 diabetes mellitus with diabetic mononeuropathy, without long-term current use of insulin ([place]) Compliant with medications without adverse side effects. Does see endocrinologist at [facility] and has upcoming appointment. Is due for eye exam and will schedule. No polyuria, polyphagia, or polyphagia. No reported hypoglycemia or hyperglycemia.   3. Hyperlipidemia associated with type 2 diabetes mellitus ([place]) Compliant with medications without associated side effects. Does exercise daily and tries to watch diet. Has cut back on ice cream and has been doing well with this. Has had fatigue since COVID-19 infection and has not been exercising as often as he used to.   4. Benign prostatic hyperplasia without lower urinary tract symptoms Well controlled with finasteride. Followed by urology and does see on a regular basis. No new or worsening symptoms.   5. Depression, recurrent ([place]) Increased depression since COVID-19 infection. Has been taking fluoxetine as prescribed but feels symptoms are worsening. States he has fatigue, loss of interest, lack of concentration, or depressed mood.  Depression screen [facility] 2/9 03/12/2019 10/09/2018 03/14/2018 11/29/2017 01/12/2017  Decreased Interest 3 0 0 0 0  Down, Depressed, Hopeless '2 1 1 '$ 0 0  PHQ - 2 Score '5 1 1 '$ 0 0  Altered sleeping 2 1 - - -  Tired, decreased  energy 2 1 - - -  Change in appetite 0 1 - - -  Feeling bad or failure about yourself  3 0 - - -  Trouble concentrating 2 0 - - -  Moving slowly or fidgety/restless 0 0 - - -  Suicidal thoughts 0 0 - - -  PHQ-9 Score 14 4 - - -  Difficult doing work/chores Somewhat difficult - - - -       Relevant past medical, surgical, family, and social history reviewed and updated as indicated.  Allergies and medications reviewed and updated. Date reviewed: Chart in Epic.   Past Medical History:  Diagnosis Date  . Acute blood loss anemia   . BCC (basal cell carcinoma), face   . Degeneration of intervertebral disc, site unspecified   . Depressive disorder, not elsewhere classified   . Esophageal stricture   . Essential hypertension, benign   . GERD (gastroesophageal reflux disease)   . Hiatal hernia   . Hyperplasia of prostate   . Osteoarthrosis and allied disorders   . Postoperative leak   . Sleep apnea   . Trauma 06/27/2017  . UTI due to Klebsiella species     Past Surgical History:  Procedure Laterality Date  . ABDOMINAL SURGERY    . ANKLE FRACTURE SURGERY    . APPENDECTOMY  [id number]  . FEMUR FRACTURE SURGERY    . JOINT REPLACEMENT    . Left Hip REplaacement  01-1999  . LUMBAR FUSION  05-1998  . NASAL SEPTUM SURGERY  1989  . PELVIC FRACTURE SURGERY    . Right Hip Replacement  09-2002  .  SPINE SURGERY    . VENTRAL HERNIA REPAIR  02-2000  . WRIST FRACTURE SURGERY      Social History   Socioeconomic History  . Marital status: Married    Spouse name: Not on file  . Number of children: 2  . Years of education: Not on file  . Highest education level: Not on file  Occupational History  . Occupation: retired  Tobacco Use  . Smoking status: Former Smoker    Types: Cigars  . Smokeless tobacco: Never Used  Substance and Sexual Activity  . Alcohol use: Not Currently  . Drug use: No  . Sexual activity: Yes  Other Topics Concern  . Not on file  Social History Narrative  .  Not on file   Social Determinants of Health   Financial Resource Strain:   . Difficulty of Paying Living Expenses: Not on file  Food Insecurity:   . Worried About Charity fundraiser in the Last Year: Not on file  . Ran Out of Food in the Last Year: Not on file  Transportation Needs:   . Lack of Transportation (Medical): Not on file  . Lack of Transportation (Non-Medical): Not on file  Physical Activity:   . Days of Exercise per Week: Not on file  . Minutes of Exercise per Session: Not on file  Stress:   . Feeling of Stress : Not on file  Social Connections:   . Frequency of Communication with Friends and Family: Not on file  . Frequency of Social Gatherings with Friends and Family: Not on file  . Attends Religious Services: Not on file  . Active Member of Clubs or Organizations: Not on file  . Attends Archivist Meetings: Not on file  . Marital Status: Not on file  Intimate Partner Violence:   . Fear of Current or Ex-Partner: Not on file  . Emotionally Abused: Not on file  . Physically Abused: Not on file  . Sexually Abused: Not on file    Outpatient Encounter Medications as of 03/12/2019  Medication Sig  . amLODipine (NORVASC) 5 MG tablet Take 1.5 tablets (7.5 mg total) by mouth daily.  . Ascorbic Acid (VITAMIN C) 1000 MG tablet Take 1,000 mg by mouth daily.  Marland Kitchen aspirin 81 MG EC tablet Take 1 tablet (81 mg total) by mouth daily.  Marland Kitchen ezetimibe (ZETIA) 10 MG tablet TAKE ONE (1) TABLET EACH DAY  . finasteride (PROPECIA) 1 MG tablet TAKE ONE (1) TABLET EACH DAY  . FLUoxetine (PROZAC) 20 MG capsule Take 4 capsules (80 mg total) by mouth daily. (Please make appt w/ new PCP)  . fluticasone (FLONASE) 50 MCG/ACT nasal spray USE 1 TO 2 SPRAYS IN EACH NOSTRIL DAILY  . gabapentin (NEURONTIN) 100 MG capsule Take 3 capsules (300 mg total) by mouth 3 (three) times daily. (Patient taking differently: Take 300 mg by mouth. Two am, one at lunch, one pm)  . ibuprofen (ADVIL) 800 MG  tablet TAKE ONE TABLET 3 TIMES A DAY AS NEEDED.  . Melatonin 3 MG TABS Take 1 tablet (3 mg total) by mouth at bedtime. (Patient taking differently: Take 2 mg by mouth at bedtime. )  . metFORMIN (GLUCOPHAGE-XR) 500 MG 24 hr tablet Take 1,000 mg by mouth 2 (two) times daily with a meal.  . Misc Natural Products (OSTEO BI-FLEX JOINT SHIELD PO) Take 2 tablets by mouth daily.  . Multiple Vitamin (MULTIVITAMIN WITH MINERALS) TABS tablet Take 1 tablet by mouth daily.  . Olopatadine HCl  0.2 % SOLN Place 1 drop into both eyes daily.  Marland Kitchen omega-3 acid ethyl esters (LOVAZA) 1 g capsule TAKE 4 CAPSULES DAILY  . pantoprazole (PROTONIX) 40 MG tablet TAKE ONE (1) TABLET EACH DAY  . PAZEO 0.7 % SOLN   . protein supplement shake (PREMIER PROTEIN) LIQD Take 325 mLs (11 oz total) by mouth 3 (three) times daily with meals.  . sildenafil (VIAGRA) 100 MG tablet   . [DISCONTINUED] FLUoxetine (PROZAC) 20 MG capsule Take 3 capsules (60 mg total) by mouth daily. (Please make appt w/ new PCP)  . [DISCONTINUED] lisinopril (ZESTRIL) 2.5 MG tablet TAKE ONE TABLET BY MOUTH TWICE DAILY (Patient not taking: Reported on 03/12/2019)   No facility-administered encounter medications on file as of 03/12/2019.    Allergies  Allergen Reactions  . Ativan [Lorazepam] Other (See Comments)    Confusion & agitation  . Dilaudid [Hydromorphone] Other (See Comments)    confusion  . Morphine Other (See Comments)    Headaches  . Seroquel [Quetiapine] Other (See Comments)    Confusion/agitation  . Statins Other (See Comments)    Muscle aches and myalgias with Lipitor,Zocor,Vytorin,Pravachol,Crestor, and Livalo even at low doses Muscle aches and myalgias with Lipitor,Zocor,Vytorin,Pravachol,Crestor, and Livalo even at low doses    Review of Systems  Constitutional: Positive for activity change and fatigue. Negative for appetite change, chills, diaphoresis, fever and unexpected weight change.  HENT: Positive for trouble swallowing (has EGD  scheduled). Negative for congestion.   Eyes: Negative.  Negative for photophobia and visual disturbance.  Respiratory: Negative for cough, chest tightness and shortness of breath.   Cardiovascular: Negative for chest pain, palpitations and leg swelling.  Gastrointestinal: Negative for abdominal pain, blood in stool, constipation, diarrhea, nausea and vomiting.  Endocrine: Negative.  Negative for cold intolerance, heat intolerance, polydipsia, polyphagia and polyuria.  Genitourinary: Negative for decreased urine volume, difficulty urinating, dysuria, frequency and urgency.  Musculoskeletal: Positive for arthralgias, gait problem (antalgic) and myalgias.  Skin: Negative.   Allergic/Immunologic: Negative.   Neurological: Negative for dizziness, tremors, seizures, syncope, facial asymmetry, speech difficulty, weakness, light-headedness, numbness and headaches.       Complains of brain fog post COVID-19 infection  Hematological: Negative.   Psychiatric/Behavioral: Positive for decreased concentration, dysphoric mood and sleep disturbance. Negative for agitation, behavioral problems, confusion, hallucinations, self-injury and suicidal ideas. The patient is nervous/anxious. The patient is not hyperactive.   All other systems reviewed and are negative.       Objective:  BP 130/75   Pulse 75   Temp 98.7 F (37.1 C)   Ht 6' (1.829 m)   Wt 195 lb (88.5 kg)   SpO2 97%   BMI 26.45 kg/m    Wt Readings from Last 3 Encounters:  03/12/19 195 lb (88.5 kg)  11/28/18 192 lb 11.2 oz (87.4 kg)  11/09/18 193 lb (87.5 kg)    Physical Exam Vitals and nursing note reviewed.  Constitutional:      General: He is not in acute distress.    Appearance: Normal appearance. He is well-developed, well-groomed and overweight. He is not ill-appearing, toxic-appearing or diaphoretic.  HENT:     Head: Normocephalic and atraumatic.     Jaw: There is normal jaw occlusion.     Right Ear: Hearing, tympanic  membrane, ear canal and external ear normal.     Left Ear: Hearing, tympanic membrane, ear canal and external ear normal.     Nose: Nose normal.     Mouth/Throat:  Lips: Pink.     Mouth: Mucous membranes are moist.     Pharynx: Oropharynx is clear. Uvula midline.  Eyes:     General: Lids are normal.     Extraocular Movements: Extraocular movements intact.     Conjunctiva/sclera: Conjunctivae normal.     Pupils: Pupils are equal, round, and reactive to light.  Neck:     Thyroid: No thyroid mass, thyromegaly or thyroid tenderness.     Vascular: No carotid bruit or JVD.     Trachea: Trachea and phonation normal.  Cardiovascular:     Rate and Rhythm: Normal rate and regular rhythm.     Chest Wall: PMI is not displaced.     Pulses: Normal pulses.     Heart sounds: Normal heart sounds. No murmur. No friction rub. No gallop.   Pulmonary:     Effort: Pulmonary effort is normal. No respiratory distress.     Breath sounds: Normal breath sounds. No wheezing.  Abdominal:     General: Bowel sounds are normal. There is no distension or abdominal bruit.     Palpations: Abdomen is soft. There is no hepatomegaly or splenomegaly.     Tenderness: There is no abdominal tenderness. There is no right CVA tenderness or left CVA tenderness.     Hernia: No hernia is present.  Musculoskeletal:     Cervical back: Normal range of motion and neck supple.     Right lower leg: No edema.     Left lower leg: No edema.  Lymphadenopathy:     Cervical: No cervical adenopathy.  Skin:    General: Skin is warm and dry.     Capillary Refill: Capillary refill takes less than 2 seconds.     Coloration: Skin is not cyanotic, jaundiced or pale.     Findings: No rash.  Neurological:     General: No focal deficit present.     Mental Status: He is alert and oriented to person, place, and time.     Cranial Nerves: Cranial nerves are intact.     Sensory: Sensation is intact.     Motor: Motor function is intact.      Coordination: Coordination is intact.     Gait: Gait abnormal (antalgic gait).     Deep Tendon Reflexes: Reflexes are normal and symmetric.  Psychiatric:        Attention and Perception: Attention and perception normal.        Mood and Affect: Affect normal. Mood is depressed.        Speech: Speech normal.        Behavior: Behavior normal. Behavior is cooperative.        Thought Content: Thought content normal.        Cognition and Memory: Cognition and memory normal.        Judgment: Judgment normal.     Results for orders placed or performed in visit on 01/21/19  Microscopic Examination   URINE  Result Value Ref Range   WBC, UA None seen 0 - 5 /hpf   RBC 0-2 0 - 2 /hpf   Epithelial Cells (non renal) None seen 0 - 10 /hpf   Renal Epithel, UA None seen None seen /hpf   Bacteria, UA None seen None seen/Few  urinalysis- dip and micro  Result Value Ref Range   Specific Gravity, UA 1.025 1.005 - 1.030   pH, UA 7.0 5.0 - 7.5   Color, UA Yellow Yellow   Appearance Ur Clear Clear  Leukocytes,UA Negative Negative   Protein,UA Negative Negative/Trace   Glucose, UA Negative Negative   Ketones, UA Negative Negative   RBC, UA Negative Negative   Bilirubin, UA Negative Negative   Urobilinogen, Ur 0.2 0.2 - 1.0 mg/dL   Nitrite, UA Negative Negative   Microscopic Examination See below:        Pertinent labs & imaging results that were available during my care of the patient were reviewed by me and considered in my medical decision making.  Assessment & Plan:  Melvin [name] was seen today for medical management of chronic issues, diabetes and hypertension.  Diagnoses and all orders for this visit:  Hypertension associated with type 2 diabetes mellitus ([place]) BP well controlled. Changes were not made in regimen today. Goal BP is 130/80. Pt aware to report any persistent high or low readings. DASH diet and exercise encouraged. Exercise at least 150 minutes per week and increase as tolerated.  Goal BMI > 25. Stress management encouraged. Avoid nicotine and tobacco product use. Avoid excessive alcohol and NSAID's. Avoid more than 2000 mg of sodium daily. Medications as prescribed. Follow up as scheduled.  -     CMP14+EGFR -     Lipid panel -     CBC with Differential/Platelet -     Thyroid Panel With TSH  Type 2 diabetes mellitus with diabetic mononeuropathy, without long-term current use of insulin ([facility]) A1C 6.2 today. No changes in regimen. Has upcoming appointment with endocrinology at [facility]. Labs pending.  -     CMP14+EGFR -     CBC with Differential/Platelet -     Bayer DCA Hb A1c Waived  Hyperlipidemia associated with type 2 diabetes mellitus ([place]) Diet encouraged - increase intake of fresh fruits and vegetables, increase intake of lean proteins. Bake, broil, or grill foods. Avoid fried, greasy, and fatty foods. Avoid fast foods. Increase intake of fiber-rich whole grains. Exercise encouraged - at least 150 minutes per week and advance as tolerated.  Goal BMI < 25. Continue medications as prescribed. Follow up in 3-6 months as discussed.  -     Lipid panel  Benign prostatic hyperplasia without lower urinary tract symptoms Well controlled. Will check PSA today. Followed by urology. Pt aware to report any new or worsening symptoms.  -     PSA, total and free  Health care maintenance -     Pneumococcal conjugate vaccine 13-valent  Depression, recurrent ([place]) Worsening symptoms. Will increase fluoxetine today and check TSH for possible underlying causes.  -     Thyroid Panel With TSH -     FLUoxetine (PROZAC) 20 MG capsule; Take 4 capsules (80 mg total) by mouth daily. (Please make appt w/ new PCP)     Continue all other maintenance medications.  Follow up plan: Return in about 4 months (around 07/10/2019), or if symptoms worsen or fail to improve, for DM.  Continue healthy lifestyle choices, including diet (rich in fruits, vegetables, and lean proteins, and low in salt  and simple carbohydrates) and exercise (at least 30 minutes of moderate physical activity daily).  Educational handout given for DM  The above assessment and management plan was discussed with the patient. The patient verbalized understanding of and has agreed to the management plan. Patient is aware to call the clinic if they develop any new symptoms or if symptoms persist or worsen. Patient is aware when to return to the clinic for a follow-up visit. Patient educated on when it is appropriate to go to the  emergency department.   Monia Pouch, FNP-C [facility] [phone]

## 2019-03-13 LAB — PSA, TOTAL AND FREE
PSA, Free Pct: 15 %
PSA, Free: 0.09 ng/mL
Prostate Specific Ag, Serum: 0.6 ng/mL (ref 0.0–4.0)

## 2019-03-13 LAB — CMP14+EGFR
ALT: 21 IU/L (ref 0–44)
AST: 18 IU/L (ref 0–40)
Albumin/Globulin Ratio: 1.5 (ref 1.2–2.2)
Albumin: 3.9 g/dL (ref 3.8–4.8)
Alkaline Phosphatase: 58 IU/L (ref 39–117)
BUN/Creatinine Ratio: 34 — ABNORMAL HIGH (ref 10–24)
BUN: 25 mg/dL (ref 8–27)
Bilirubin Total: 0.3 mg/dL (ref 0.0–1.2)
CO2: 22 mmol/L (ref 20–29)
Calcium: 9.2 mg/dL (ref 8.6–10.2)
Chloride: 105 mmol/L (ref 96–106)
Creatinine, Ser: 0.73 mg/dL — ABNORMAL LOW (ref 0.76–1.27)
GFR calc Af Amer: 109 mL/min/{1.73_m2} (ref >59)
GFR calc non Af Amer: 94 mL/min/{1.73_m2} (ref >59)
Globulin, Total: 2.6 g/dL (ref 1.5–4.5)
Glucose: 147 mg/dL — ABNORMAL HIGH (ref 65–99)
Potassium: 4.2 mmol/L (ref 3.5–5.2)
Sodium: 140 mmol/L (ref 134–144)
Total Protein: 6.5 g/dL (ref 6.0–8.5)

## 2019-03-13 LAB — CBC WITH DIFFERENTIAL/PLATELET
Basophils Absolute: 0.1 10*3/uL (ref 0.0–0.2)
Basos: 1 %
EOS (ABSOLUTE): 0.4 10*3/uL (ref 0.0–0.4)
Eos: 6 %
Hematocrit: 43.7 % (ref 37.5–51.0)
Hemoglobin: 15 g/dL (ref 13.0–17.7)
Immature Grans (Abs): 0 10*3/uL (ref 0.0–0.1)
Immature Granulocytes: 0 %
Lymphocytes Absolute: 1.9 10*3/uL (ref 0.7–3.1)
Lymphs: 30 %
MCH: 30.1 pg (ref 26.6–33.0)
MCHC: 34.3 g/dL (ref 31.5–35.7)
MCV: 88 fL (ref 79–97)
Monocytes Absolute: 0.5 10*3/uL (ref 0.1–0.9)
Monocytes: 8 %
Neutrophils Absolute: 3.5 10*3/uL (ref 1.4–7.0)
Neutrophils: 55 %
Platelets: 187 10*3/uL (ref 150–450)
RBC: 4.98 x10E6/uL (ref 4.14–5.80)
RDW: 12.8 % (ref 11.6–15.4)
WBC: 6.5 10*3/uL (ref 3.4–10.8)

## 2019-03-13 LAB — LIPID PANEL
Chol/HDL Ratio: 4 ratio (ref 0.0–5.0)
Cholesterol, Total: 162 mg/dL (ref 100–199)
HDL: 41 mg/dL (ref >39)
LDL Chol Calc (NIH): 102 mg/dL — ABNORMAL HIGH (ref 0–99)
Triglycerides: 104 mg/dL (ref 0–149)
VLDL Cholesterol Cal: 19 mg/dL (ref 5–40)

## 2019-03-13 LAB — THYROID PANEL WITH TSH
Free Thyroxine Index: 1.8 (ref 1.2–4.9)
T3 Uptake Ratio: 28 % (ref 24–39)
T4, Total: 6.6 ug/dL (ref 4.5–12.0)
TSH: 2.31 u[IU]/mL (ref 0.450–4.500)

## 2019-03-30 ENCOUNTER — Other Ambulatory Visit: Payer: Self-pay | Admitting: Family Medicine

## 2019-03-30 DIAGNOSIS — E785 Hyperlipidemia, unspecified: Secondary | ICD-10-CM

## 2019-03-30 DIAGNOSIS — E1169 Type 2 diabetes mellitus with other specified complication: Secondary | ICD-10-CM

## 2019-04-07 ENCOUNTER — Other Ambulatory Visit: Payer: Self-pay | Admitting: Family Medicine

## 2019-04-08 DIAGNOSIS — Z8616 Personal history of COVID-19: Secondary | ICD-10-CM | POA: Insufficient documentation

## 2019-04-08 DIAGNOSIS — F331 Major depressive disorder, recurrent, moderate: Secondary | ICD-10-CM | POA: Insufficient documentation

## 2019-04-08 DIAGNOSIS — E1140 Type 2 diabetes mellitus with diabetic neuropathy, unspecified: Secondary | ICD-10-CM | POA: Insufficient documentation

## 2019-04-13 ENCOUNTER — Other Ambulatory Visit: Payer: Self-pay | Admitting: Family Medicine

## 2019-04-16 ENCOUNTER — Other Ambulatory Visit: Payer: Self-pay

## 2019-04-16 ENCOUNTER — Ambulatory Visit (INDEPENDENT_AMBULATORY_CARE_PROVIDER_SITE_OTHER): Payer: Medicare Other | Admitting: Family Medicine

## 2019-04-16 ENCOUNTER — Encounter: Payer: Self-pay | Admitting: Family Medicine

## 2019-04-16 ENCOUNTER — Ambulatory Visit (INDEPENDENT_AMBULATORY_CARE_PROVIDER_SITE_OTHER): Payer: Medicare Other

## 2019-04-16 VITALS — BP 146/82 | HR 94 | Temp 99.2°F | Ht 72.0 in | Wt 191.0 lb

## 2019-04-16 DIAGNOSIS — R399 Unspecified symptoms and signs involving the genitourinary system: Secondary | ICD-10-CM | POA: Diagnosis not present

## 2019-04-16 DIAGNOSIS — R1030 Lower abdominal pain, unspecified: Secondary | ICD-10-CM

## 2019-04-16 LAB — MICROSCOPIC EXAMINATION
Bacteria, UA: NONE SEEN
Epithelial Cells (non renal): NONE SEEN /hpf (ref 0–10)
Renal Epithel, UA: NONE SEEN /hpf
WBC, UA: NONE SEEN /hpf (ref 0–5)

## 2019-04-16 LAB — URINALYSIS, COMPLETE
Bilirubin, UA: NEGATIVE
Glucose, UA: NEGATIVE
Ketones, UA: NEGATIVE
Leukocytes,UA: NEGATIVE
Nitrite, UA: NEGATIVE
Protein,UA: NEGATIVE
Specific Gravity, UA: 1.02 (ref 1.005–1.030)
Urobilinogen, Ur: 1 mg/dL (ref 0.2–1.0)
pH, UA: 6.5 (ref 5.0–7.5)

## 2019-04-16 NOTE — Patient Instructions (Signed)
Your urine did not show evidence of infection today.  The Keflex may be obscuring this.  For this reason I want you to continue taking the Keflex twice daily until I contact you with the urine culture results.  I have a high suspicion that you may be passing a bladder stone.  You have crystals in your urine today which suggest this.  I am getting an x-ray to see if we can find evidence of bladder stone in there.  I will contact you with the results once available   In the interim, I want you hydrating as much as possible.  Your urine should go to pale yellow/ clear with adequate hydration.  Try and avoid things that are dehydrating like caffeine   Bladder Stone  A bladder stone is a buildup of crystals made from the proteins and minerals found in urine. These substances build up when urine becomes too concentrated. Urine is concentrated when there is less water and more proteins and minerals in it. Bladder stones usually develop when a person has another medical condition that prevents the bladder from emptying completely. Crystals can form in the small amount of urine that is left in the bladder. Bladder stones that grow large can become painful and may block the flow of urine. What are the causes? This condition may be caused by:  An enlarged prostate, which prevents the bladder from emptying well.  An infection of a part of your urinary system (urinary tract infection, or UTI). This includes the: ? Kidneys. ? Bladder. ? Ureters. These are the tubes that carry urine to your bladder. ? Urethra. This is the tube that drains urine from your bladder.  A weak spot in the bladder that creates a small pouch (bladder diverticulum).  Nerve damage that may interfere with the signals from your brain to your bladder muscles (neurogenic bladder). This can result from conditions such as Parkinson's disease or spinal cord injuries. What increases the risk? This condition is more likely to develop in  people who:  Get frequent UTIs.  Have another medical condition that affects the bladder.  Have a history of bladder surgery.  Have a spinal cord injury.  Have an abnormal shape of the bladder (deformity). What are the signs or symptoms? Common symptoms of this condition include:  Pain in the abdomen.  A need to urinate more often.  Difficulty or pain when urinating.  Blood in the urine.  Cloudy urine or urine that is dark in color.  Pain in the penis or testicles in men. Small bladder stones do not always cause symptoms. How is this diagnosed? This condition may be diagnosed based on your symptoms, medical history, and physical exam. The physical exam will check for tenderness in your abdomen. For men, an exam in the rectum may be done to check the prostate gland.  You may have tests, such as: ? A urine test (urinalysis). ? A urine sample test to check for other infections (culture). ? Blood tests, including tests to look for a certain substance (creatinine). A creatinine level that is higher than normal could indicate a blockage. ? A procedure to check your bladder using a scope with a camera (cystoscopy).  You may also have imaging studies, such as: ? CT scan or ultrasound of your abdomen and the area between your hip bones (pelvis or pelvic area). ? An X-ray of your urinary system. How is this treated? This condition may be treated with:  Cystolitholapaxy. This procedure uses a  laser, ultrasound, or other device to break the stone into smaller pieces. Fluids are used to flush the small pieces from the area.  Surgery to remove the stone.  A stent. This is a small mesh tube that is threaded into your ureter to make urine flow.  Medicines to treat pain. Follow these instructions at home: Medicines  Take over-the-counter and prescription medicines only as told by your health care provider.  Ask your health care provider if the medicine prescribed to  you: ? Requires you to avoid driving or using heavy machinery. ? Can cause constipation. You may need to take these actions to prevent or treat constipation:  Take over-the-counter or prescription medicines.  Eat foods that are high in fiber, such as beans, whole grains, and fresh fruits and vegetables.  Limit foods that are high in fat and processed sugars, such as fried or sweet foods. Alcohol use  Do not drink alcohol if: ? Your health care provider tells you not to drink. ? You are pregnant, may be pregnant, or are planning to become pregnant.  If you drink alcohol: ? Limit how much you drink to:  0-1 drink a day for women.  0-2 drinks a day for men. ? Be aware of how much alcohol is in your drink. In the [place] one drink equals one 12 oz bottle of beer (355 mL), one 5 oz glass of wine (148 mL), or one 1 oz glass of hard liquor (44 mL). Activity  Rest as told by your health care provider.  Return to your normal activities as told by your health care provider. Ask your health care provider what activities are safe for you. General instructions   Drink enough fluid to keep your urine pale yellow.  Tell your health care provider about any unusual symptoms related to urinating. Early diagnosis of an enlarged prostate and other bladder conditions may reduce your risk of getting bladder stones.  Do not use any products that contain nicotine or tobacco, such as cigarettes, e-cigarettes, or chewing tobacco. If you need help quitting, ask your health care provider.  Do not use drugs. Where to find more information [facility]): www.urologyhealth.org Contact a health care provider if you:  Have a fever.  Feel nauseous or vomit.  Are unable to urinate.  Have a large amount of blood in your urine. Get help right away if you:  Have severe back pain or pain in the lower part of your abdomen.  Cannot eat or drink without vomiting.  Vomit after taking your  medicine. Summary  A bladder stone is a buildup of crystals made from the proteins and minerals found in urine. These substances build up when urine becomes too concentrated.  Bladder stones that grow large can become painful and may block the flow of urine.  Bladder stones may be treated with a laser, a stent, surgery, or pain medicines. This information is not intended to replace advice given to you by your health care provider. Make sure you discuss any questions you have with your health care provider. Document Revised: 08/15/2018 Document Reviewed: 08/15/2018 Elsevier Patient Education  [place].
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
## 2019-04-17 ENCOUNTER — Other Ambulatory Visit (HOSPITAL_COMMUNITY): Payer: Self-pay | Admitting: Internal Medicine

## 2019-04-17 ENCOUNTER — Ambulatory Visit (HOSPITAL_COMMUNITY)
Admission: RE | Admit: 2019-04-17 | Discharge: 2019-04-17 | Disposition: A | Discharge status: Home / Self Care | Payer: Medicare Other | Source: Ambulatory Visit | Attending: Internal Medicine | Admitting: Internal Medicine

## 2019-04-17 ENCOUNTER — Other Ambulatory Visit: Payer: Self-pay | Admitting: Internal Medicine

## 2019-04-17 DIAGNOSIS — R109 Unspecified abdominal pain: Secondary | ICD-10-CM | POA: Diagnosis not present

## 2019-04-17 LAB — URINE CULTURE: Organism ID, Bacteria: NO GROWTH

## 2019-04-20 ENCOUNTER — Other Ambulatory Visit: Payer: Self-pay | Admitting: Family Medicine

## 2019-05-01 ENCOUNTER — Other Ambulatory Visit: Payer: Self-pay | Admitting: Family Medicine

## 2019-05-01 NOTE — Telephone Encounter (Signed)
Last office visit 03/12/2019 Last refill 04/07/2010, #90, no refills

## 2019-05-16 ENCOUNTER — Encounter: Payer: Self-pay | Admitting: *Deleted

## 2019-05-25 ENCOUNTER — Other Ambulatory Visit: Payer: Self-pay | Admitting: Family Medicine

## 2019-05-25 DIAGNOSIS — K222 Esophageal obstruction: Secondary | ICD-10-CM

## 2019-05-25 DIAGNOSIS — K2100 Gastro-esophageal reflux disease with esophagitis, without bleeding: Secondary | ICD-10-CM

## 2019-06-13 ENCOUNTER — Other Ambulatory Visit: Payer: Self-pay | Admitting: *Deleted

## 2019-06-25 DIAGNOSIS — Z85820 Personal history of malignant melanoma of skin: Secondary | ICD-10-CM | POA: Insufficient documentation

## 2019-06-30 ENCOUNTER — Other Ambulatory Visit: Payer: Self-pay | Admitting: *Deleted

## 2019-06-30 DIAGNOSIS — N400 Enlarged prostate without lower urinary tract symptoms: Secondary | ICD-10-CM

## 2019-06-30 MED ORDER — FINASTERIDE 1 MG PO TABS: ORAL_TABLET | ORAL | 0 refills | Status: DC

## 2019-07-10 ENCOUNTER — Other Ambulatory Visit: Payer: Self-pay | Admitting: *Deleted

## 2019-07-10 DIAGNOSIS — E1159 Type 2 diabetes mellitus with other circulatory complications: Secondary | ICD-10-CM

## 2019-07-10 MED ORDER — AMLODIPINE BESYLATE 5 MG PO TABS: 7.5000 mg | ORAL_TABLET | Freq: Every day | ORAL | 0 refills | Status: DC

## 2019-07-15 ENCOUNTER — Ambulatory Visit: Payer: Self-pay | Admitting: Family Medicine

## 2019-07-15 ENCOUNTER — Ambulatory Visit: Payer: Medicare Other | Admitting: Family Medicine

## 2019-07-24 ENCOUNTER — Other Ambulatory Visit: Payer: Self-pay

## 2019-07-24 ENCOUNTER — Ambulatory Visit (INDEPENDENT_AMBULATORY_CARE_PROVIDER_SITE_OTHER): Payer: Medicare Other | Admitting: Internal Medicine

## 2019-07-24 ENCOUNTER — Other Ambulatory Visit: Payer: Self-pay | Admitting: Family Medicine

## 2019-07-24 ENCOUNTER — Encounter: Payer: Self-pay | Admitting: Internal Medicine

## 2019-07-24 VITALS — BP 138/84 | HR 74 | Temp 97.1°F | Ht 72.0 in | Wt 195.2 lb

## 2019-07-24 DIAGNOSIS — G4733 Obstructive sleep apnea (adult) (pediatric): Secondary | ICD-10-CM

## 2019-07-24 DIAGNOSIS — U071 COVID-19: Secondary | ICD-10-CM

## 2019-07-24 DIAGNOSIS — Z9989 Dependence on other enabling machines and devices: Secondary | ICD-10-CM | POA: Diagnosis not present

## 2019-07-24 DIAGNOSIS — N400 Enlarged prostate without lower urinary tract symptoms: Secondary | ICD-10-CM

## 2019-07-24 NOTE — Patient Instructions (Signed)
Order- DME [facility]- please replace old CPAP machine, auto 5-20, mask of choice, humidifier, supplies, AirView/ card  Please call if we can help
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
## 2019-07-26 DIAGNOSIS — U071 COVID-19: Secondary | ICD-10-CM | POA: Insufficient documentation

## 2019-07-26 NOTE — Assessment & Plan Note (Signed)
Benefits from CPAP. Never got machine replaced, so we will order it now with auto 5- 20.

## 2019-07-26 NOTE — Assessment & Plan Note (Signed)
Resolved except for some residual fatigue which is nonspecific.  Has had Moderna vaccination. Plan- increase activity as tolerated.

## 2019-07-31 ENCOUNTER — Ambulatory Visit: Payer: Medicare Other | Admitting: Family Medicine

## 2019-08-01 ENCOUNTER — Other Ambulatory Visit: Payer: Self-pay

## 2019-08-01 ENCOUNTER — Other Ambulatory Visit: Payer: Medicare Other

## 2019-08-01 DIAGNOSIS — E785 Hyperlipidemia, unspecified: Secondary | ICD-10-CM

## 2019-08-01 DIAGNOSIS — E1141 Type 2 diabetes mellitus with diabetic mononeuropathy: Secondary | ICD-10-CM

## 2019-08-01 DIAGNOSIS — I152 Hypertension secondary to endocrine disorders: Secondary | ICD-10-CM

## 2019-08-01 LAB — BAYER DCA HB A1C WAIVED: HB A1C (BAYER DCA - WAIVED): 5.9 % (ref <7.0)

## 2019-08-02 LAB — CMP14+EGFR
ALT: 16 IU/L (ref 0–44)
AST: 17 IU/L (ref 0–40)
Albumin/Globulin Ratio: 1.9 (ref 1.2–2.2)
Albumin: 4.1 g/dL (ref 3.8–4.8)
Alkaline Phosphatase: 59 IU/L (ref 48–121)
BUN/Creatinine Ratio: 34 — ABNORMAL HIGH (ref 10–24)
BUN: 24 mg/dL (ref 8–27)
Bilirubin Total: 0.3 mg/dL (ref 0.0–1.2)
CO2: 23 mmol/L (ref 20–29)
Calcium: 8.8 mg/dL (ref 8.6–10.2)
Chloride: 103 mmol/L (ref 96–106)
Creatinine, Ser: 0.71 mg/dL — ABNORMAL LOW (ref 0.76–1.27)
GFR calc Af Amer: 110 mL/min/{1.73_m2} (ref >59)
GFR calc non Af Amer: 95 mL/min/{1.73_m2} (ref >59)
Globulin, Total: 2.2 g/dL (ref 1.5–4.5)
Glucose: 159 mg/dL — ABNORMAL HIGH (ref 65–99)
Potassium: 4.2 mmol/L (ref 3.5–5.2)
Sodium: 137 mmol/L (ref 134–144)
Total Protein: 6.3 g/dL (ref 6.0–8.5)

## 2019-08-02 LAB — LIPID PANEL
Chol/HDL Ratio: 3.8 ratio (ref 0.0–5.0)
Cholesterol, Total: 157 mg/dL (ref 100–199)
HDL: 41 mg/dL (ref >39)
LDL Chol Calc (NIH): 98 mg/dL (ref 0–99)
Triglycerides: 97 mg/dL (ref 0–149)
VLDL Cholesterol Cal: 18 mg/dL (ref 5–40)

## 2019-08-02 LAB — CBC WITH DIFFERENTIAL/PLATELET
Basophils Absolute: 0 10*3/uL (ref 0.0–0.2)
Basos: 1 %
EOS (ABSOLUTE): 0.4 10*3/uL (ref 0.0–0.4)
Eos: 6 %
Hematocrit: 41.5 % (ref 37.5–51.0)
Hemoglobin: 14 g/dL (ref 13.0–17.7)
Immature Grans (Abs): 0 10*3/uL (ref 0.0–0.1)
Immature Granulocytes: 0 %
Lymphocytes Absolute: 2.2 10*3/uL (ref 0.7–3.1)
Lymphs: 34 %
MCH: 30.1 pg (ref 26.6–33.0)
MCHC: 33.7 g/dL (ref 31.5–35.7)
MCV: 89 fL (ref 79–97)
Monocytes Absolute: 0.6 10*3/uL (ref 0.1–0.9)
Monocytes: 9 %
Neutrophils Absolute: 3.3 10*3/uL (ref 1.4–7.0)
Neutrophils: 50 %
Platelets: 175 10*3/uL (ref 150–450)
RBC: 4.65 x10E6/uL (ref 4.14–5.80)
RDW: 12.5 % (ref 11.6–15.4)
WBC: 6.5 10*3/uL (ref 3.4–10.8)

## 2019-08-03 NOTE — Progress Notes (Signed)
Hello Melvin [name],  Your lab result is normal and/or stable.Some minor variations that are not significant are commonly marked abnormal, but do not represent any medical problem for you.  Best regards, Shaya Altamura, M.D.

## 2019-08-05 ENCOUNTER — Ambulatory Visit: Payer: Medicare Other | Admitting: Family Medicine

## 2019-08-05 ENCOUNTER — Encounter: Payer: Self-pay | Admitting: Family Medicine

## 2019-08-07 ENCOUNTER — Ambulatory Visit (INDEPENDENT_AMBULATORY_CARE_PROVIDER_SITE_OTHER): Payer: Medicare Other | Admitting: Family Medicine

## 2019-08-07 ENCOUNTER — Encounter: Payer: Self-pay | Admitting: Family Medicine

## 2019-08-07 ENCOUNTER — Other Ambulatory Visit: Payer: Self-pay

## 2019-08-07 VITALS — BP 131/82 | HR 80 | Temp 98.4°F | Resp 20 | Ht 72.0 in | Wt 196.0 lb

## 2019-08-07 DIAGNOSIS — E785 Hyperlipidemia, unspecified: Secondary | ICD-10-CM

## 2019-08-07 DIAGNOSIS — Z87828 Personal history of other (healed) physical injury and trauma: Secondary | ICD-10-CM

## 2019-08-07 DIAGNOSIS — E1169 Type 2 diabetes mellitus with other specified complication: Secondary | ICD-10-CM

## 2019-08-07 DIAGNOSIS — I10 Essential (primary) hypertension: Secondary | ICD-10-CM

## 2019-08-07 DIAGNOSIS — E1141 Type 2 diabetes mellitus with diabetic mononeuropathy: Secondary | ICD-10-CM

## 2019-08-07 DIAGNOSIS — Z789 Other specified health status: Secondary | ICD-10-CM | POA: Diagnosis not present

## 2019-08-07 DIAGNOSIS — E1159 Type 2 diabetes mellitus with other circulatory complications: Secondary | ICD-10-CM

## 2019-08-08 ENCOUNTER — Telehealth: Payer: Self-pay

## 2019-08-08 ENCOUNTER — Other Ambulatory Visit: Payer: Self-pay | Admitting: Family Medicine

## 2019-08-08 ENCOUNTER — Encounter: Payer: Self-pay | Admitting: Family Medicine

## 2019-08-08 MED ORDER — METFORMIN HCL ER 500 MG PO TB24: 500.0000 mg | ORAL_TABLET | Freq: Two times a day (BID) | ORAL | 1 refills | Status: DC

## 2019-08-08 MED ORDER — LISINOPRIL 2.5 MG PO TABS: 2.5000 mg | ORAL_TABLET | Freq: Two times a day (BID) | ORAL | 3 refills | Status: DC

## 2019-08-08 NOTE — Addendum Note (Signed)
Addended by: Claretta Fraise on: 08/08/2019 12:32 PM   Modules accepted: Orders
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
## 2019-08-08 NOTE — Telephone Encounter (Signed)
Office visit 08/07/2019 with Dr. Livia Snellen

## 2019-08-14 ENCOUNTER — Telehealth: Payer: Self-pay | Admitting: Family Medicine

## 2019-08-14 NOTE — Telephone Encounter (Signed)
Patient knows the handicap form is ready.

## 2019-08-14 NOTE — Telephone Encounter (Signed)
Left message to please call our office.Describe health issues that require a handicap placard?   Examples:   use oxygen, arthritis,orthopedic disabilities, use cane etc.

## 2019-08-14 NOTE — Telephone Encounter (Signed)
Form for handicap placard is on provider's desk. Patient has had multiple surgeries on hips and other parts.  He has arthritis pain and must use a cane. Please review and sign form , if applicable, give to nurse to call patient .

## 2019-08-14 NOTE — Telephone Encounter (Signed)
Pt requesting a handicap placard form to be completed as he is having a hard time walking.

## 2019-08-23 ENCOUNTER — Emergency Department (HOSPITAL_COMMUNITY)
Admission: EM | Admit: 2019-08-23 | Discharge: 2019-08-23 | Disposition: A | Discharge status: Home / Self Care | Payer: Medicare Other | Attending: Emergency Medicine | Admitting: Emergency Medicine

## 2019-08-23 ENCOUNTER — Other Ambulatory Visit: Payer: Self-pay

## 2019-08-23 ENCOUNTER — Encounter (HOSPITAL_COMMUNITY): Payer: Self-pay

## 2019-08-23 ENCOUNTER — Emergency Department (HOSPITAL_COMMUNITY): Payer: Medicare Other

## 2019-08-23 DIAGNOSIS — Z7984 Long term (current) use of oral hypoglycemic drugs: Secondary | ICD-10-CM | POA: Diagnosis not present

## 2019-08-23 DIAGNOSIS — Z7982 Long term (current) use of aspirin: Secondary | ICD-10-CM | POA: Insufficient documentation

## 2019-08-23 DIAGNOSIS — I10 Essential (primary) hypertension: Secondary | ICD-10-CM | POA: Diagnosis not present

## 2019-08-23 DIAGNOSIS — R4182 Altered mental status, unspecified: Secondary | ICD-10-CM | POA: Diagnosis not present

## 2019-08-23 DIAGNOSIS — Z79899 Other long term (current) drug therapy: Secondary | ICD-10-CM | POA: Diagnosis not present

## 2019-08-23 DIAGNOSIS — Z87891 Personal history of nicotine dependence: Secondary | ICD-10-CM | POA: Diagnosis not present

## 2019-08-23 DIAGNOSIS — E119 Type 2 diabetes mellitus without complications: Secondary | ICD-10-CM | POA: Insufficient documentation

## 2019-08-23 LAB — CBC WITH DIFFERENTIAL/PLATELET
Abs Immature Granulocytes: 0.02 10*3/uL (ref 0.00–0.07)
Basophils Absolute: 0 10*3/uL (ref 0.0–0.1)
Basophils Relative: 1 %
Eosinophils Absolute: 0.2 10*3/uL (ref 0.0–0.5)
Eosinophils Relative: 2 %
HCT: 44.3 % (ref 39.0–52.0)
Hemoglobin: 14.8 g/dL (ref 13.0–17.0)
Immature Granulocytes: 0 %
Lymphocytes Relative: 18 %
Lymphs Abs: 1.5 10*3/uL (ref 0.7–4.0)
MCH: 30.3 pg (ref 26.0–34.0)
MCHC: 33.4 g/dL (ref 30.0–36.0)
MCV: 90.6 fL (ref 80.0–100.0)
Monocytes Absolute: 0.6 10*3/uL (ref 0.1–1.0)
Monocytes Relative: 7 %
Neutro Abs: 6.2 10*3/uL (ref 1.7–7.7)
Neutrophils Relative %: 72 %
Platelets: 172 10*3/uL (ref 150–400)
RBC: 4.89 MIL/uL (ref 4.22–5.81)
RDW: 13 % (ref 11.5–15.5)
WBC: 8.5 10*3/uL (ref 4.0–10.5)
nRBC: 0 % (ref 0.0–0.2)

## 2019-08-23 LAB — BASIC METABOLIC PANEL
Anion gap: 11 (ref 5–15)
BUN: 20 mg/dL (ref 8–23)
CO2: 25 mmol/L (ref 22–32)
Calcium: 9.2 mg/dL (ref 8.9–10.3)
Chloride: 103 mmol/L (ref 98–111)
Creatinine, Ser: 0.64 mg/dL (ref 0.61–1.24)
GFR calc Af Amer: >60 mL/min (ref >60)
GFR calc non Af Amer: >60 mL/min (ref >60)
Glucose, Bld: 140 mg/dL — ABNORMAL HIGH (ref 70–99)
Potassium: 3.5 mmol/L (ref 3.5–5.1)
Sodium: 139 mmol/L (ref 135–145)

## 2019-08-23 LAB — CBG MONITORING, ED: Glucose-Capillary: 137 mg/dL — ABNORMAL HIGH (ref 70–99)

## 2019-08-23 MED ORDER — SODIUM CHLORIDE 0.9 % IV BOLUS
500.0000 mL | Freq: Once | INTRAVENOUS | Status: AC
  Administered 2019-08-23: 500 mL via INTRAVENOUS

## 2019-08-23 NOTE — Discharge Instructions (Addendum)
The testing today is reassuring.  There is no sign of stroke, or significant problems with blood pressure or blood chemistries.  Follow-up with your primary care doctor for checkup in 2 days, return here if needed.  You should talk to the doctor that prescribed the Wellbutrin as it may be contributing to your problem.  Do not drive, or work until you follow-up with your primary care doctor and your symptoms resolved.  Take all of your medicine as directed.  Make sure you are getting plenty rest, drinking a lot of fluids, and eating 3 meals a day.  Avoid using Viagra for now.
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
## 2019-08-23 NOTE — ED Triage Notes (Signed)
Per wife pt took Viagra around 5:30-6 this afternoon and became confused around 7:30.

## 2019-08-28 DIAGNOSIS — Z0289 Encounter for other administrative examinations: Secondary | ICD-10-CM

## 2019-09-04 ENCOUNTER — Other Ambulatory Visit: Payer: Self-pay | Admitting: Family Medicine

## 2019-09-05 ENCOUNTER — Other Ambulatory Visit: Payer: Self-pay | Admitting: Family Medicine

## 2019-09-05 DIAGNOSIS — N400 Enlarged prostate without lower urinary tract symptoms: Secondary | ICD-10-CM

## 2019-09-05 MED ORDER — FLUTICASONE PROPIONATE 50 MCG/ACT NA SUSP: 1.0000 | Freq: Every day | NASAL | 3 refills | Status: DC

## 2019-09-24 ENCOUNTER — Other Ambulatory Visit: Payer: Self-pay | Admitting: Family Medicine

## 2019-09-24 DIAGNOSIS — K222 Esophageal obstruction: Secondary | ICD-10-CM

## 2019-09-24 DIAGNOSIS — K2100 Gastro-esophageal reflux disease with esophagitis, without bleeding: Secondary | ICD-10-CM

## 2019-10-01 ENCOUNTER — Other Ambulatory Visit: Payer: Self-pay | Admitting: Family Medicine

## 2019-10-01 DIAGNOSIS — E1159 Type 2 diabetes mellitus with other circulatory complications: Secondary | ICD-10-CM

## 2019-10-03 ENCOUNTER — Other Ambulatory Visit: Payer: Self-pay

## 2019-10-03 DIAGNOSIS — E785 Hyperlipidemia, unspecified: Secondary | ICD-10-CM

## 2019-10-03 MED ORDER — EZETIMIBE 10 MG PO TABS: ORAL_TABLET | ORAL | 5 refills | Status: DC

## 2019-10-07 ENCOUNTER — Other Ambulatory Visit: Payer: Self-pay | Admitting: Family Medicine

## 2019-10-12 ENCOUNTER — Emergency Department (HOSPITAL_COMMUNITY)
Admission: EM | Admit: 2019-10-12 | Discharge: 2019-10-12 | Disposition: A | Discharge status: Home / Self Care | Payer: Medicare Other | Attending: Emergency Medicine | Admitting: Emergency Medicine

## 2019-10-12 ENCOUNTER — Other Ambulatory Visit: Payer: Self-pay

## 2019-10-12 ENCOUNTER — Encounter (HOSPITAL_COMMUNITY): Payer: Self-pay

## 2019-10-12 DIAGNOSIS — Z8616 Personal history of COVID-19: Secondary | ICD-10-CM | POA: Insufficient documentation

## 2019-10-12 DIAGNOSIS — I10 Essential (primary) hypertension: Secondary | ICD-10-CM | POA: Insufficient documentation

## 2019-10-12 DIAGNOSIS — Z2914 Encounter for prophylactic rabies immune globin: Secondary | ICD-10-CM | POA: Insufficient documentation

## 2019-10-12 DIAGNOSIS — Z23 Encounter for immunization: Secondary | ICD-10-CM | POA: Diagnosis not present

## 2019-10-12 DIAGNOSIS — E1169 Type 2 diabetes mellitus with other specified complication: Secondary | ICD-10-CM | POA: Diagnosis not present

## 2019-10-12 DIAGNOSIS — Z79899 Other long term (current) drug therapy: Secondary | ICD-10-CM | POA: Insufficient documentation

## 2019-10-12 DIAGNOSIS — Z7982 Long term (current) use of aspirin: Secondary | ICD-10-CM | POA: Insufficient documentation

## 2019-10-12 DIAGNOSIS — Z203 Contact with and (suspected) exposure to rabies: Secondary | ICD-10-CM

## 2019-10-12 DIAGNOSIS — Z7984 Long term (current) use of oral hypoglycemic drugs: Secondary | ICD-10-CM | POA: Diagnosis not present

## 2019-10-12 MED ORDER — RABIES VACCINE, PCEC IM SUSR
1.0000 mL | Freq: Once | INTRAMUSCULAR | Status: AC
  Administered 2019-10-12: 1 mL via INTRAMUSCULAR
  Filled 2019-10-12: qty 1

## 2019-10-12 MED ORDER — RABIES IMMUNE GLOBULIN 150 UNIT/ML IM INJ
INJECTION | INTRAMUSCULAR | Status: AC
  Filled 2019-10-12: qty 12

## 2019-10-12 MED ORDER — RABIES IMMUNE GLOBULIN 150 UNIT/ML IM INJ
20.0000 [IU]/kg | INJECTION | Freq: Once | INTRAMUSCULAR | Status: AC
  Administered 2019-10-12: 1800 [IU] via INTRAMUSCULAR
  Filled 2019-10-12: qty 12

## 2019-10-12 NOTE — Discharge Instructions (Signed)
Please [facility] up at the [facility] department to receive your rabies postexposure prophylaxis on days 3, 7, and 14. (September 8th, 12th, 19th)

## 2019-10-12 NOTE — ED Triage Notes (Signed)
Pt to er, pt states that last week his dog was playing in the yard and they found a dead bat in the yard after, states that his wife was concerned so they came in together. States that he had no contact with the bat, states that he did throw the bat away.

## 2019-10-12 NOTE — ED Provider Notes (Signed)
[facility] EMERGENCY DEPARTMENT Provider Note   CSN: [id number] Arrival date & time: 10/12/19  1909     History Chief Complaint  Patient presents with  . Rabies Injection    Melvin [name]. is a 69 y.o. male.  The history is provided by the patient. No language interpreter was used.     69 year old male accompanied by wife to the ER with concerns of potential rabies exposure.  Patient states he normally sit outside his porch and sometimes sleeping outside throughout the day.  2 weeks ago he noticed a localized skin irritation on his left hand that has swollen up.  This is very unusual for him.  He thought it may be due to a spider bite.  The swelling has gone away.  He recently found a dead bat on his porch.  His wife and himself were concern for potential rabies exposure.  He does not have any specific symptoms such as fever or headache.  He does inquire about rabies treatment.  Past Medical History:  Diagnosis Date  . Acute blood loss anemia   . BCC (basal cell carcinoma), face   . Degeneration of intervertebral disc, site unspecified   . Depressive disorder, not elsewhere classified   . Esophageal stricture   . Essential hypertension, benign   . GERD (gastroesophageal reflux disease)   . Hiatal hernia   . Hyperplasia of prostate   . Osteoarthrosis and allied disorders   . Postoperative leak   . Sleep apnea   . Trauma 06/27/2017  . UTI due to Klebsiella species     Patient Active Problem List   Diagnosis Date Noted  . COVID-19 virus infection 07/26/2019  . Thrombocytopenia ([place]) 11/22/2018  . Gait instability 11/12/2018  . UTI (urinary tract infection) 11/09/2018  . Vitamin D deficiency 10/09/2018  . 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 Multiple closed fractures of pelvis with unstable disruption of pelvic ring ([place]) 09/18/2017  . Obstructive sleep apnea on CPAP 08/24/2017  . BPH (benign  prostatic hyperplasia) 08/24/2017  . AKI (acute kidney injury) ([place]) 08/24/2017  . Hypomagnesemia 08/24/2017  . Hypoalbuminemia due to protein-calorie malnutrition ([place]) 08/24/2017  . Type I or II open fracture of left radius with routine healing 07/13/2017  . Slow transit constipation   . Dry eyes   . History of multiple trauma   . Sleep disturbance   . History of pulmonary embolism 06/27/2017  . 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 Peri-prosthetic femur fracture at tip of prosthesis 06/27/2017  . Seasonal and perennial allergic rhinitis 02/17/2016  . Type 2 diabetes mellitus with diabetic mononeuropathy, without long-term current use of insulin ([place]) 02/26/2015  . Erectile dysfunction 04/15/2013  . Statin intolerance 04/15/2013  . Hyperlipidemia associated with type 2 diabetes mellitus ([place]) 04/15/2013  . Hypertension associated with type 2 diabetes mellitus ([place]) 10/18/2012  . BCC (basal cell carcinoma), face   . Hiatal hernia with gastroesophageal reflux    . Degeneration of intervertebral disc, site unspecified   . Esophageal stricture, history of   . Depression, recurrent ([place])   . Osteoarthritis     Past Surgical History:  Procedure Laterality Date  . ABDOMINAL SURGERY    . ANKLE FRACTURE SURGERY    . APPENDECTOMY  [id number]  . FEMUR FRACTURE SURGERY    . JOINT REPLACEMENT    . Left Hip REplaacement  01-1999  .  LUMBAR FUSION  05-1998  . NASAL SEPTUM SURGERY  1989  . PELVIC FRACTURE SURGERY    . Right Hip Replacement  09-2002  . SPINE SURGERY    . VENTRAL HERNIA REPAIR  02-2000  . WRIST FRACTURE SURGERY         Family History  Adopted: Yes  Problem Relation Age of Onset  . Lymphoma Mother   . Arthritis Mother   . Asthma Mother   . Kidney failure Father   . Hypertension Father   . Hypertension Brother   . Hyperlipidemia Brother   . Bipolar disorder Brother   . Breast cancer Sister     Social History    Tobacco Use  . Smoking status: Former Smoker    Types: Cigars  . Smokeless tobacco: Never Used  Substance Use Topics  . Alcohol use: Not Currently  . Drug use: No    Home Medications Prior to Admission medications   Medication Sig Start Date End Date Taking? Authorizing Provider  amLODipine (NORVASC) 5 MG tablet TAKE 1 AND 1/2 TABLETS DAILY 10/01/19   Claretta Fraise, MD  Ascorbic Acid (VITAMIN C) 1000 MG tablet Take 1,000 mg by mouth daily.    [provider]  aspirin 81 MG EC tablet Take 1 tablet (81 mg total) by mouth daily. 07/10/17   Love, Ivan Anchors, PA-C  buPROPion (WELLBUTRIN XL) 150 MG 24 hr tablet Take 150 mg by mouth daily. 07/23/19   [provider]  ezetimibe (ZETIA) 10 MG tablet TAKE ONE (1) TABLET EACH DAY 10/03/19   Claretta Fraise, MD  finasteride (PROPECIA) 1 MG tablet TAKE ONE (1) TABLET EACH DAY Patient taking differently: Take 1 mg by mouth daily.  07/25/19   Claretta Fraise, MD  FLUoxetine (PROZAC) 20 MG capsule Take 4 capsules (80 mg total) by mouth daily. (Please make appt w/ new PCP) Patient taking differently: Take 40-60 mg by mouth See admin instructions. Alternate taking 40mg  one day, and 60mg  the next day, then repeat 03/12/19 08/23/19  Rakes, Connye Burkitt, FNP  fluticasone [facility]) 50 MCG/ACT nasal spray Place 1-2 sprays into both nostrils daily. 09/05/19   Claretta Fraise, MD  gabapentin (NEURONTIN) 100 MG capsule Take 3 capsules (300 mg total) by mouth 3 (three) times daily. Patient taking differently: Take 100 mg by mouth 3 (three) times daily.  10/09/18 08/23/19  Baruch Gouty, FNP  ibuprofen (ADVIL) 800 MG tablet TAKE ONE TABLET 3 TIMES A DAY AS NEEDED. 10/08/19   Claretta Fraise, MD  lisinopril (ZESTRIL) 2.5 MG tablet Take 1 tablet (2.5 mg total) by mouth 2 (two) times daily. 08/08/19   Claretta Fraise, MD  Melatonin 3 MG TABS Take 1 tablet (3 mg total) by mouth at bedtime. Patient taking differently: Take 6 mg by mouth at bedtime.  07/10/17   Love, Ivan Anchors,  PA-C  metFORMIN (GLUCOPHAGE-XR) 500 MG 24 hr tablet Take 1 tablet (500 mg total) by mouth 2 (two) times daily with a meal. Patient taking differently: Take 500-1,000 mg by mouth See admin instructions. Take 1000mg  in the morning and take 500mg  at supper and at bedtime 08/08/19   Claretta Fraise, MD  Misc Natural Products (OSTEO BI-FLEX JOINT SHIELD PO) Take 2 tablets by mouth daily.    [provider]  Multiple Vitamin (MULTIVITAMIN WITH MINERALS) TABS tablet Take 1 tablet by mouth daily. 07/10/17   Love, Ivan Anchors, PA-C  Olopatadine HCl 0.2 % SOLN Place 1 drop into both eyes daily.    [provider]  omega-3 acid ethyl esters (LOVAZA) 1 g capsule TAKE 4 CAPSULES DAILY Patient taking differently: Take 2 g by mouth daily.  04/21/19   Baruch Gouty, FNP  pantoprazole (PROTONIX) 40 MG tablet TAKE ONE (1) TABLET EACH DAY 09/25/19   Claretta Fraise, MD  PAZEO 0.7 % SOLN Place 1 drop into both ears daily.  09/25/18   [provider]  protein supplement shake (PREMIER PROTEIN) LIQD Take 325 mLs (11 oz total) by mouth 3 (three) times daily with meals. Patient taking differently: Take 11 oz by mouth 2 (two) times daily as needed (for supplement).  07/10/17   Love, Ivan Anchors, PA-C  sildenafil (VIAGRA) 100 MG tablet TAKE 1 TABLET BY MOUTH ONCE DAILY FOR ERECTILE DYSFUNCTION 09/05/19   Claretta Fraise, MD  sildenafil (VIAGRA) 100 MG tablet Take 100 mg by mouth as needed for erectile dysfunction.  06/17/18   [provider]    Allergies    Ativan [lorazepam], Dilaudid [hydromorphone], Morphine, Seroquel [quetiapine], and Statins  Review of Systems   Review of Systems  All other systems reviewed and are negative.   Physical Exam Updated Vital Signs BP (!) 161/105 (BP Location: Right Arm)   Pulse 80   Temp 98.3 F (36.8 C) (Oral)   Resp 18   Ht 6' (1.829 m)   Wt 90.7 kg   SpO2 97%   BMI 27.12 kg/m   Physical Exam Vitals and nursing note reviewed.  Constitutional:       General: He is not in acute distress.    Appearance: He is well-developed.  HENT:     Head: Atraumatic.  Eyes:     Conjunctiva/sclera: Conjunctivae normal.  Musculoskeletal:     Cervical back: Neck supple.  Skin:    Findings: No rash.     Comments: Left hand: No signs of injury no signs of cellulitis or any acute abnormalities on inspection of left hand.  Neurological:     Mental Status: He is alert.     ED Results / Procedures / Treatments   Labs (all labs ordered are listed, but only abnormal results are displayed) Labs Reviewed - No data to display  EKG None  Radiology No results found.  Procedures Procedures (including critical care time)  Medications Ordered in ED Medications  rabies immune globulin (HYPERAB/KEDRAB) injection 1,800 Units (1,800 Units Intramuscular Given 10/12/19 2100)  rabies vaccine (RABAVERT) injection 1 mL (1 mL Intramuscular Given 10/12/19 2026)    ED Course  I have reviewed the triage vital signs and the nursing notes.  Pertinent labs & imaging results that were available during my care of the patient were reviewed by me and considered in my medical decision making (see chart for details).    MDM Rules/Calculators/A&P                          BP (!) 161/105 (BP Location: Right Arm)   Pulse 80   Temp 98.3 F (36.8 C) (Oral)   Resp 18   Ht 6' (1.829 m)   Wt 90.7 kg   SpO2 97%   BMI 27.12 kg/m   Final Clinical Impression(s) / ED Diagnoses Final diagnoses:  Contact with and (suspected) exposure to rabies    Rx / DC Orders ED Discharge Orders    None     8:57 PM Patient accompanied by wife to the ER with concern for potential rabies exposure when they found a dead bat on the  porch.  He report having noticed a localized skin irritation to his left hand previously with some swelling but did not recall being bitten by anything.  Suspicion for rabies exposure is very low however out of abundance of precaution, will provide rabies  coverage which includes immunoglobulin as well as vaccine.  Patient provide additional vaccination schedule.   Domenic Moras, PA-C 10/12/19 2151    Maudie Flakes, MD 10/12/19 2253

## 2019-10-12 NOTE — ED Notes (Signed)
Present to ED one week after being questionably exposed to [place] dead bat in back  [place] thre away rather than animal control   Wonder if pet rolled in bat and bat had rabies   Here for eval

## 2019-10-15 ENCOUNTER — Other Ambulatory Visit: Payer: Self-pay

## 2019-10-15 ENCOUNTER — Ambulatory Visit
Admission: EM | Admit: 2019-10-15 | Discharge: 2019-10-15 | Disposition: A | Discharge status: Home / Self Care | Payer: Medicare Other | Attending: Emergency Medicine | Admitting: Emergency Medicine

## 2019-10-15 DIAGNOSIS — Z203 Contact with and (suspected) exposure to rabies: Secondary | ICD-10-CM | POA: Diagnosis not present

## 2019-10-15 MED ORDER — RABIES VACCINE, PCEC IM SUSR
1.0000 mL | Freq: Once | INTRAMUSCULAR | Status: AC
  Administered 2019-10-15: 1 mL via INTRAMUSCULAR

## 2019-10-15 NOTE — ED Triage Notes (Signed)
Needs 2nd rabies vaccine

## 2019-10-19 ENCOUNTER — Other Ambulatory Visit: Payer: Self-pay

## 2019-10-19 ENCOUNTER — Ambulatory Visit
Admission: EM | Admit: 2019-10-19 | Discharge: 2019-10-19 | Disposition: A | Discharge status: Home / Self Care | Payer: Medicare Other | Attending: Emergency Medicine | Admitting: Emergency Medicine

## 2019-10-19 DIAGNOSIS — Z203 Contact with and (suspected) exposure to rabies: Secondary | ICD-10-CM | POA: Diagnosis not present

## 2019-10-19 DIAGNOSIS — Z2914 Encounter for prophylactic rabies immune globin: Secondary | ICD-10-CM

## 2019-10-19 MED ORDER — RABIES VACCINE, PCEC IM SUSR
1.0000 mL | Freq: Once | INTRAMUSCULAR | Status: AC
  Administered 2019-10-19: 1 mL via INTRAMUSCULAR

## 2019-10-26 ENCOUNTER — Other Ambulatory Visit: Payer: Self-pay

## 2019-10-26 ENCOUNTER — Emergency Department (INDEPENDENT_AMBULATORY_CARE_PROVIDER_SITE_OTHER)
Admission: EM | Admit: 2019-10-26 | Discharge: 2019-10-26 | Disposition: A | Discharge status: Home / Self Care | Payer: Medicare Other | Source: Home / Self Care

## 2019-10-26 DIAGNOSIS — Z23 Encounter for immunization: Secondary | ICD-10-CM | POA: Diagnosis not present

## 2019-10-26 DIAGNOSIS — Z203 Contact with and (suspected) exposure to rabies: Secondary | ICD-10-CM | POA: Diagnosis not present

## 2019-10-26 DIAGNOSIS — Z2914 Encounter for prophylactic rabies immune globin: Secondary | ICD-10-CM

## 2019-10-26 MED ORDER — RABIES VACCINE, PCEC IM SUSR
1.0000 mL | Freq: Once | INTRAMUSCULAR | Status: AC
  Administered 2019-10-26: 1 mL via INTRAMUSCULAR

## 2019-10-26 NOTE — Discharge Instructions (Signed)
Patient tolerated 4th injection well, no issues.

## 2019-10-26 NOTE — ED Triage Notes (Signed)
HERE FOR 4TH RABIES INJECTION

## 2019-10-31 DIAGNOSIS — I4729 Other ventricular tachycardia: Secondary | ICD-10-CM | POA: Insufficient documentation

## 2019-10-31 DIAGNOSIS — Z136 Encounter for screening for cardiovascular disorders: Secondary | ICD-10-CM | POA: Insufficient documentation

## 2019-10-31 DIAGNOSIS — G454 Transient global amnesia: Secondary | ICD-10-CM | POA: Insufficient documentation

## 2019-11-12 ENCOUNTER — Other Ambulatory Visit: Payer: Self-pay | Admitting: Family Medicine

## 2019-11-12 DIAGNOSIS — E1159 Type 2 diabetes mellitus with other circulatory complications: Secondary | ICD-10-CM

## 2019-11-18 ENCOUNTER — Other Ambulatory Visit: Payer: Self-pay | Admitting: Family Medicine

## 2020-01-04 ENCOUNTER — Other Ambulatory Visit: Payer: Self-pay | Admitting: Family Medicine

## 2020-01-22 ENCOUNTER — Other Ambulatory Visit: Payer: Self-pay | Admitting: Family Medicine

## 2020-01-22 DIAGNOSIS — K2100 Gastro-esophageal reflux disease with esophagitis, without bleeding: Secondary | ICD-10-CM

## 2020-01-22 DIAGNOSIS — K222 Esophageal obstruction: Secondary | ICD-10-CM

## 2020-02-02 ENCOUNTER — Other Ambulatory Visit: Payer: Self-pay | Admitting: Family Medicine

## 2020-02-02 DIAGNOSIS — I152 Hypertension secondary to endocrine disorders: Secondary | ICD-10-CM

## 2020-02-09 ENCOUNTER — Ambulatory Visit: Payer: Medicare Other | Admitting: Family Medicine

## 2020-02-11 ENCOUNTER — Ambulatory Visit: Payer: Medicare Other | Admitting: Family Medicine

## 2020-02-20 ENCOUNTER — Other Ambulatory Visit: Payer: Self-pay | Admitting: Family Medicine

## 2020-03-03 ENCOUNTER — Ambulatory Visit: Payer: Medicare Other | Admitting: Family Medicine

## 2020-03-05 ENCOUNTER — Encounter: Payer: Self-pay | Admitting: Family Medicine

## 2020-03-25 ENCOUNTER — Other Ambulatory Visit: Payer: Self-pay | Admitting: Family Medicine

## 2020-03-25 DIAGNOSIS — E1169 Type 2 diabetes mellitus with other specified complication: Secondary | ICD-10-CM

## 2020-04-01 ENCOUNTER — Other Ambulatory Visit: Payer: Self-pay

## 2020-04-01 ENCOUNTER — Ambulatory Visit (INDEPENDENT_AMBULATORY_CARE_PROVIDER_SITE_OTHER): Payer: Medicare Other | Admitting: Family Medicine

## 2020-04-01 ENCOUNTER — Encounter: Payer: Self-pay | Admitting: Family Medicine

## 2020-04-01 VITALS — BP 131/81 | HR 61 | Temp 97.7°F | Resp 20 | Ht 72.0 in | Wt 202.1 lb

## 2020-04-01 DIAGNOSIS — E1169 Type 2 diabetes mellitus with other specified complication: Secondary | ICD-10-CM

## 2020-04-01 DIAGNOSIS — E1159 Type 2 diabetes mellitus with other circulatory complications: Secondary | ICD-10-CM | POA: Diagnosis not present

## 2020-04-01 DIAGNOSIS — E1141 Type 2 diabetes mellitus with diabetic mononeuropathy: Secondary | ICD-10-CM

## 2020-04-01 DIAGNOSIS — E785 Hyperlipidemia, unspecified: Secondary | ICD-10-CM | POA: Diagnosis not present

## 2020-04-01 DIAGNOSIS — I152 Hypertension secondary to endocrine disorders: Secondary | ICD-10-CM

## 2020-04-01 NOTE — Progress Notes (Signed)
Subjective:  Patient ID: Melvin [name].,  male    DOB: 1950-06-30  Age: 70 y.o.    CC: Medical Management of Chronic Issues   HPI Melvin [name]. presents for  follow-up of hypertension. Patient has no history of headache chest pain or shortness of breath or recent cough. Patient also denies symptoms of TIA such as numbness weakness lateralizing. Patient denies side effects from medication. States taking it regularly.  Patient also  in for follow-up of elevated cholesterol. Doing well without complaints on current medication. Denies side effects  including myalgia and arthralgia and nausea. Also in today for liver function testing. Currently no chest pain, shortness of breath or other cardiovascular related symptoms noted.  Follow-up of diabetes. He is seeing endocrine at [facility] for [facility]abeetes care. Patient denies symptoms such as excessive hunger or urinary frequency, excessive hunger, nausea No significant hypoglycemic spells noted. Medications reviewed. Pt reports taking them regularly. Pt. denies complication/adverse reaction today.    History Melvin [name] has a past medical history of Acute blood loss anemia, BCC (basal cell carcinoma), face, Degeneration of intervertebral disc, site unspecified, Depressive disorder, not elsewhere classified, Esophageal stricture, Essential hypertension, benign, GERD (gastroesophageal reflux disease), Hiatal hernia, Hyperplasia of prostate, Osteoarthrosis and allied disorders, Postoperative leak, Sleep apnea, Trauma (06/27/2017), and UTI due to Klebsiella species.   He has a past surgical history that includes Appendectomy [id number]); Nasal septum surgery (1989); Left Hip REplaacement (01-1999); Right Hip Replacement (09-2002); Lumbar fusion (05-1998); Ventral hernia repair (02-2000); Spine surgery; Joint replacement; Ankle fracture surgery; Femur fracture surgery; Pelvic fracture surgery; Wrist fracture surgery; and Abdominal surgery.   His  family history includes Arthritis in his mother; Asthma in his mother; Bipolar disorder in his brother; Breast cancer in his sister; Hyperlipidemia in his brother; Hypertension in his brother and father; Kidney failure in his father; Lymphoma in his mother. He was adopted.He reports that he has quit smoking. His smoking use included cigars. He has never used smokeless tobacco. He reports previous alcohol use. He reports that he does not use drugs.  Current Outpatient Medications on File Prior to Visit  Medication Sig Dispense Refill  . amLODipine (NORVASC) 5 MG tablet TAKE 1 AND 1/2 TABLETS DAILY 135 tablet 0  . Ascorbic Acid (VITAMIN C) 1000 MG tablet Take 1,000 mg by mouth daily.    Marland Kitchen aspirin 81 MG EC tablet Take 1 tablet (81 mg total) by mouth daily.    . empagliflozin (JARDIANCE) 10 MG TABS tablet Take 1 tablet by mouth daily with breakfast.    . ezetimibe (ZETIA) 10 MG tablet TAKE ONE (1) TABLET EACH DAY (Needs to be seen before next refill) 30 tablet 0  . FLUoxetine (PROZAC) 20 MG capsule Take 4 capsules (80 mg total) by mouth daily. (Please make appt w/ new PCP) (Patient taking differently: Take 40-60 mg by mouth See admin instructions. Alternate taking $RemoveBeforeDE'40mg'XxZfTVeuCzmiFZH$  one day, and $Remove'60mg'lhplaiS$  the next day, then repeat) 360 capsule 2  . fluticasone (FLONASE) 50 MCG/ACT nasal spray Place 1-2 sprays into both nostrils daily. 16 g 3  . ibuprofen (ADVIL) 800 MG tablet TAKE ONE TABLET 3 TIMES A DAY AS NEEDED. 90 tablet 0  . lisinopril (ZESTRIL) 2.5 MG tablet Take 1 tablet (2.5 mg total) by mouth 2 (two) times daily. 60 tablet 3  . Melatonin 3 MG TABS Take 1 tablet (3 mg total) by mouth at bedtime. (Patient taking differently: Take 6 mg by mouth at bedtime.) 30 tablet 0  .  metFORMIN (GLUCOPHAGE-XR) 500 MG 24 hr tablet Take 1 tablet (500 mg total) by mouth 2 (two) times daily. (Needs to be seen before next refill) (Patient taking differently: Take 500 mg by mouth in the morning, at noon, and at bedtime.) 60 tablet 0  .  Misc Natural Products (OSTEO BI-FLEX JOINT SHIELD PO) Take 2 tablets by mouth daily.    . Multiple Vitamin (MULTIVITAMIN WITH MINERALS) TABS tablet Take 1 tablet by mouth daily.    . Olopatadine HCl 0.2 % SOLN Place 1 drop into both eyes daily.    Marland Kitchen omega-3 acid ethyl esters (LOVAZA) 1 g capsule TAKE 4 CAPSULES DAILY (Patient taking differently: Take 2 g by mouth daily.) 360 capsule 1  . pantoprazole (PROTONIX) 40 MG tablet Take 1 tablet (40 mg total) by mouth daily. (Needs to be seen before next refill) 30 tablet 2  . PAZEO 0.7 % SOLN Place 1 drop into both ears daily.     . protein supplement shake (PREMIER PROTEIN) LIQD Take 325 mLs (11 oz total) by mouth 3 (three) times daily with meals. (Patient taking differently: Take 11 oz by mouth 2 (two) times daily as needed (for supplement).)  0  . sildenafil (VIAGRA) 100 MG tablet TAKE 1 TABLET BY MOUTH ONCE DAILY FOR ERECTILE DYSFUNCTION 10 tablet 3   No current facility-administered medications on file prior to visit.    ROS Review of Systems  Constitutional: Negative for fever.  Respiratory: Negative for shortness of breath.   Cardiovascular: Negative for chest pain.  Musculoskeletal: Negative for arthralgias.  Skin: Negative for rash.    Objective:  BP 131/81   Pulse 61   Temp 97.7 F (36.5 C) (Temporal)   Resp 20   Ht 6' (1.829 m)   Wt 202 lb 2 oz (91.7 kg)   SpO2 94%   BMI 27.41 kg/m   BP Readings from Last 3 Encounters:  04/01/20 131/81  10/12/19 (!) 150/89  08/23/19 (!) 156/96    Wt Readings from Last 3 Encounters:  04/01/20 202 lb 2 oz (91.7 kg)  10/12/19 200 lb (90.7 kg)  08/23/19 196 lb 3.4 oz (89 kg)     Physical Exam Vitals reviewed.  Constitutional:      Appearance: He is well-developed and well-nourished.  HENT:     Head: Normocephalic and atraumatic.     Right Ear: External ear normal.     Left Ear: External ear normal.     Mouth/Throat:     Pharynx: No oropharyngeal exudate or posterior  oropharyngeal erythema.  Eyes:     Pupils: Pupils are equal, round, and reactive to light.  Cardiovascular:     Rate and Rhythm: Normal rate and regular rhythm.     Heart sounds: No murmur heard.   Pulmonary:     Effort: No respiratory distress.     Breath sounds: Normal breath sounds.  Musculoskeletal:     Cervical back: Normal range of motion and neck supple.  Neurological:     Mental Status: He is alert and oriented to person, place, and time.     Diabetic Foot Exam - Simple   No data filed       Assessment & Plan:   Melvin [name] was seen today for medical management of chronic issues.  Diagnoses and all orders for this visit:  Type 2 diabetes mellitus with diabetic mononeuropathy, without long-term current use of insulin (HCC) -     Microalbumin / creatinine urine ratio -  Bayer DCA Hb A1c Waived -     CBC with Differential/Platelet -     CMP14+EGFR -     Lipid panel  Hyperlipidemia associated with type 2 diabetes mellitus ([place]) -     CBC with Differential/Platelet -     CMP14+EGFR -     Lipid panel  Hypertension associated with type 2 diabetes mellitus ([place]) -     CBC with Differential/Platelet -     CMP14+EGFR -     Lipid panel   I have discontinued Crista Luria. Jahr Jr.'s buPROPion. I am also having him maintain his Olopatadine HCl, Misc Natural Products (OSTEO BI-FLEX JOINT SHIELD PO), vitamin C, aspirin, multivitamin with minerals, protein supplement shake, melatonin, [place], FLUoxetine, omega-3 acid ethyl esters, lisinopril, sildenafil, fluticasone, pantoprazole, amLODipine, ibuprofen, ezetimibe, metFORMIN, and empagliflozin.  No orders of the defined types were placed in this encounter.    Follow-up: Return in about 6 months (around 09/29/2020).  Claretta Fraise, M.D.

## 2020-04-02 ENCOUNTER — Other Ambulatory Visit: Payer: Self-pay | Admitting: Family Medicine

## 2020-04-04 ENCOUNTER — Encounter: Payer: Self-pay | Admitting: Family Medicine

## 2020-04-14 ENCOUNTER — Other Ambulatory Visit: Payer: Self-pay | Admitting: Family Medicine

## 2020-04-19 ENCOUNTER — Other Ambulatory Visit: Payer: Self-pay | Admitting: Family Medicine

## 2020-04-19 DIAGNOSIS — K2100 Gastro-esophageal reflux disease with esophagitis, without bleeding: Secondary | ICD-10-CM

## 2020-04-19 DIAGNOSIS — K222 Esophageal obstruction: Secondary | ICD-10-CM

## 2020-04-28 ENCOUNTER — Other Ambulatory Visit: Payer: Self-pay | Admitting: Family Medicine

## 2020-04-28 DIAGNOSIS — E1169 Type 2 diabetes mellitus with other specified complication: Secondary | ICD-10-CM

## 2020-04-28 DIAGNOSIS — E1159 Type 2 diabetes mellitus with other circulatory complications: Secondary | ICD-10-CM

## 2020-04-28 DIAGNOSIS — E785 Hyperlipidemia, unspecified: Secondary | ICD-10-CM

## 2020-04-30 IMAGING — DX DG KNEE COMPLETE 4+V*R*
4 series · 4 of 4 positions shown · non-contrast
Comparison: None.

CLINICAL DATA: Remote injury.  Pain

EXAM:
RIGHT KNEE - COMPLETE 4+ VIEW

[knee ap (1 of 3)]
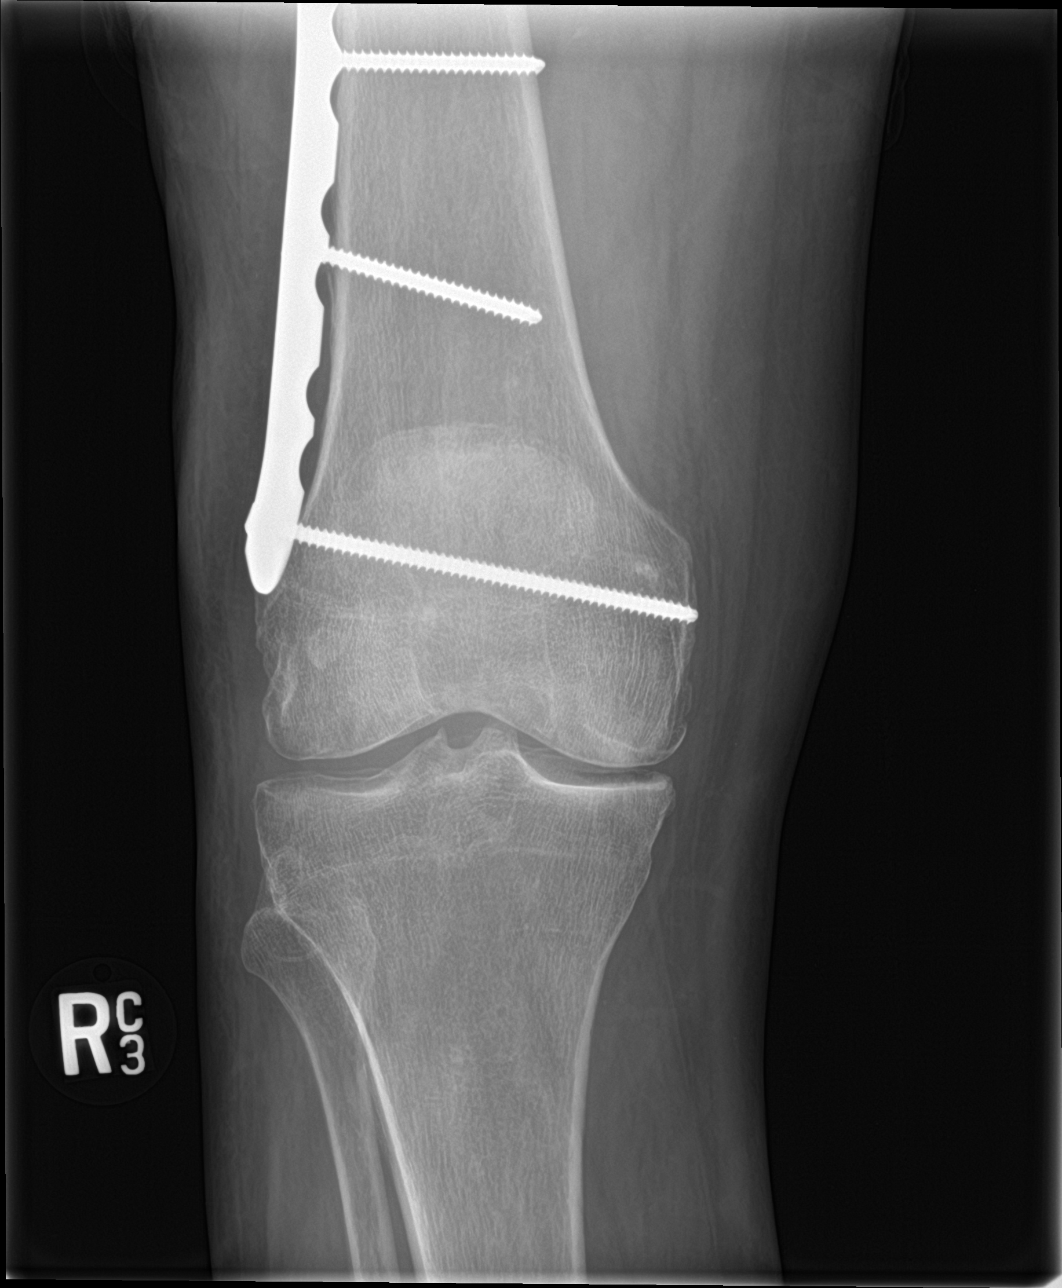

[knee ap (2 of 3)]
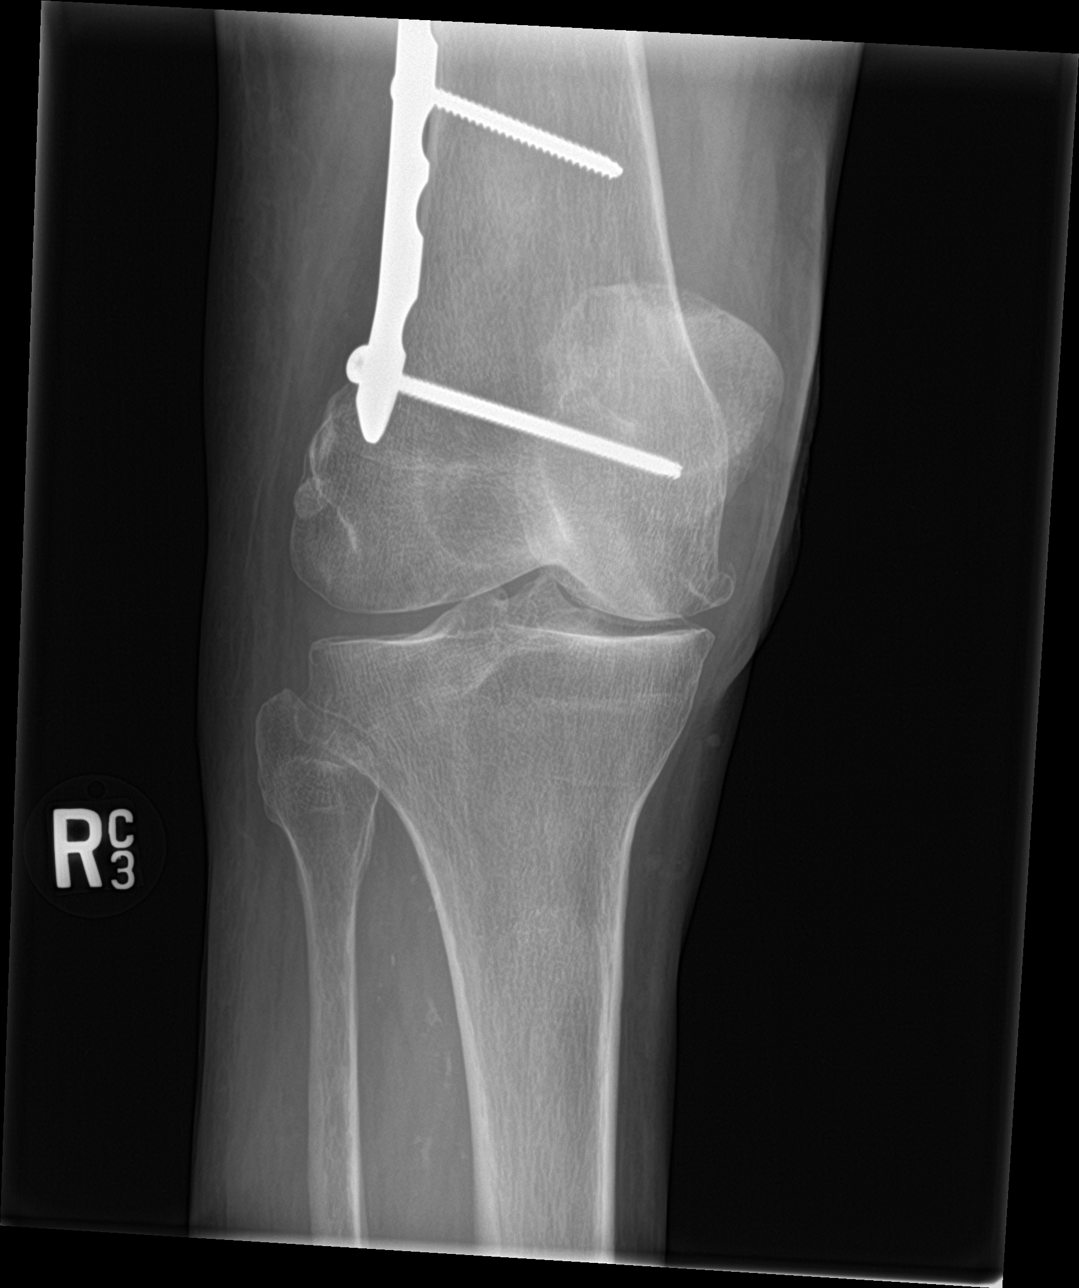

[knee ap (3 of 3)]
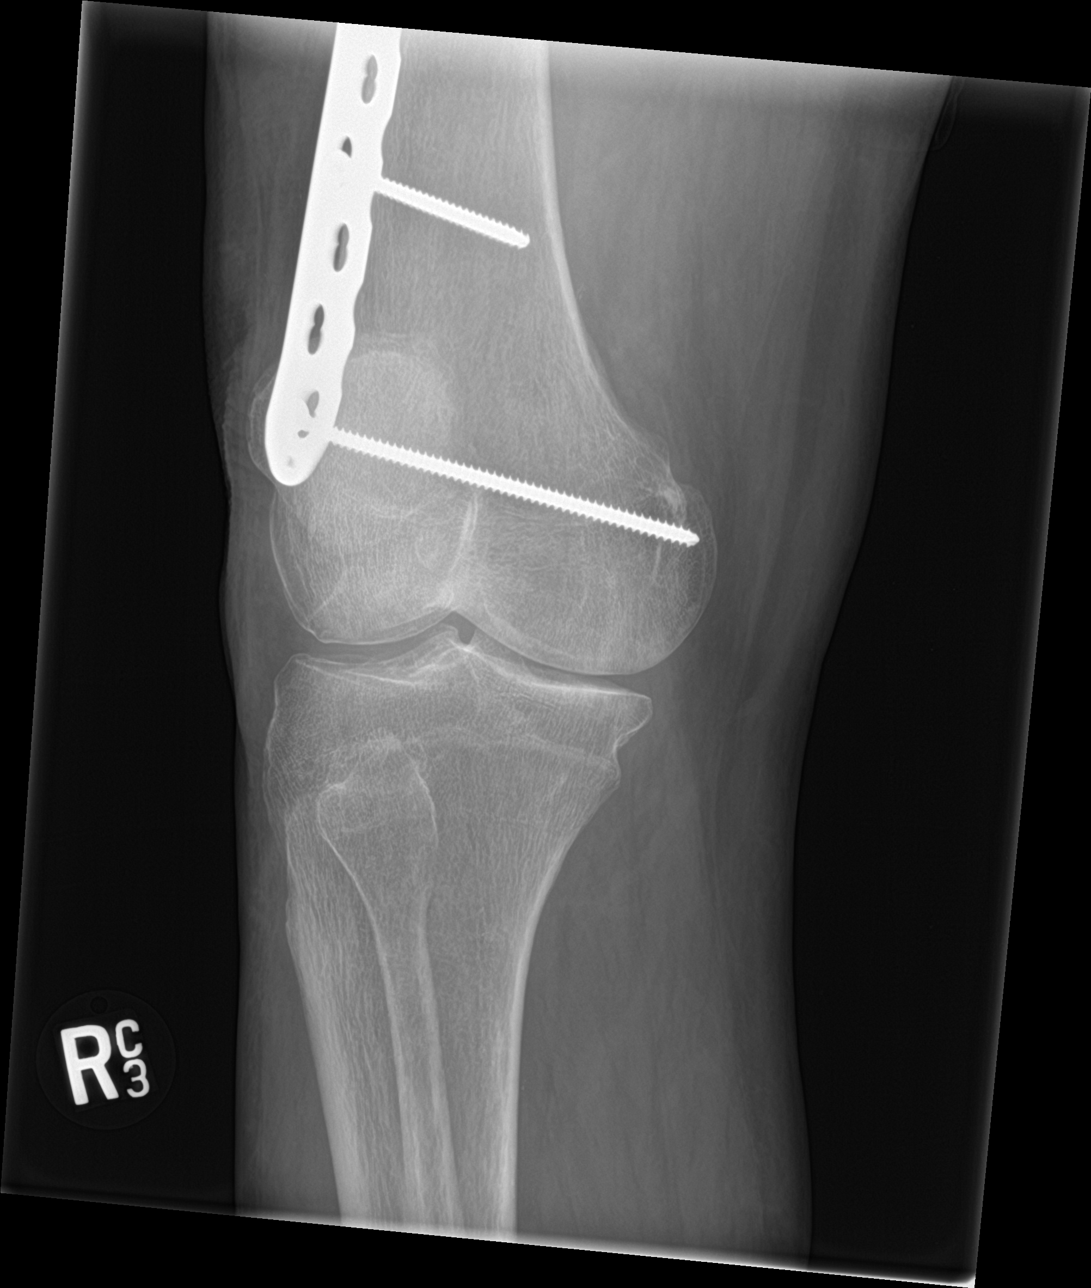

[knee lat]
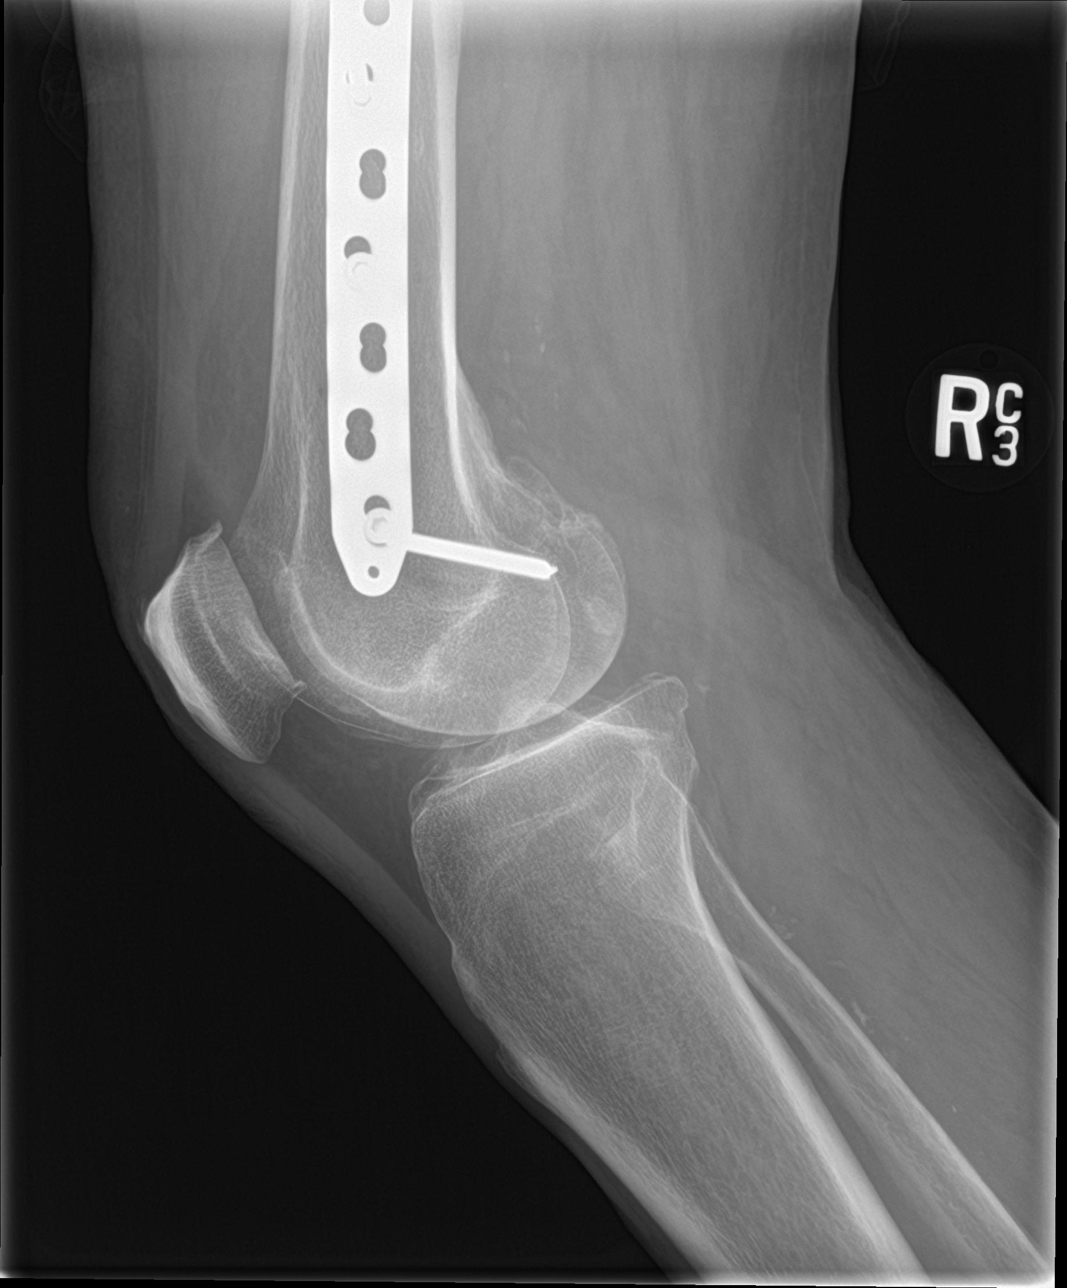

[4 of 4 positions shown; findings below may reference images not displayed]

FINDINGS: Plate and screw fixation device partially visualized in the distal
right femur. Mild joint space narrowing and spurring in the medial
and patellofemoral compartments. No acute bony abnormality.
Specifically, no fracture, subluxation, or dislocation. No joint
effusion
IMPRESSION: Mild osteoarthritis.  No acute bony abnormality.

## 2020-04-30 IMAGING — US RIGHT LOWER EXTREMITY VENOUS ULTRASOUND
1 series · 13 of 24 positions shown · non-contrast
Comparison: None.

CLINICAL DATA: 67-year-old male with a history broken leg and calf
pain



[Series 1: right lower extremity venous ultrasound · 0.09mm/px · 13 of 50 slices shown]
[im 1/50]
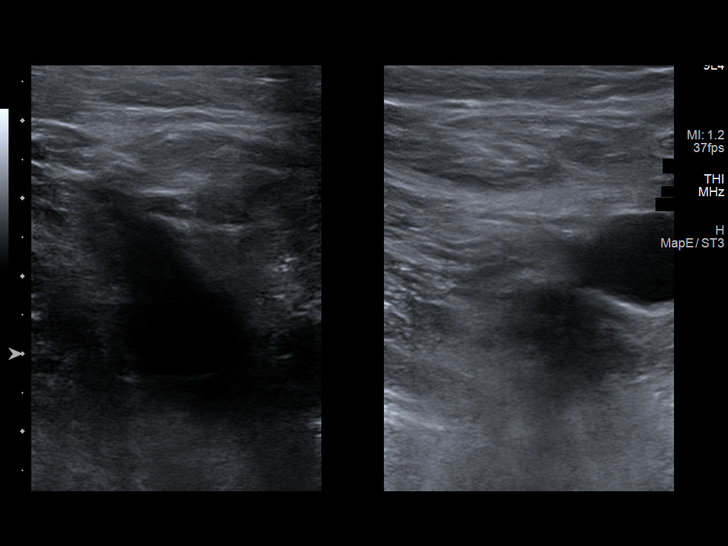
[im 5/50]
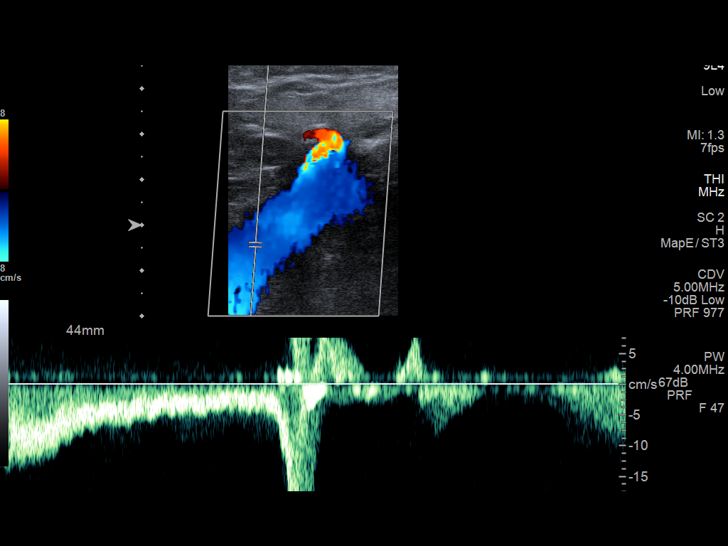
[im 9/50]
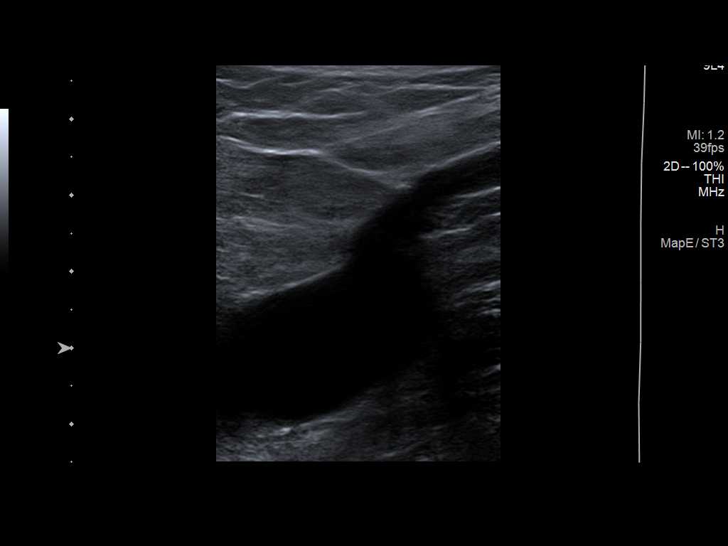
[im 13/50]
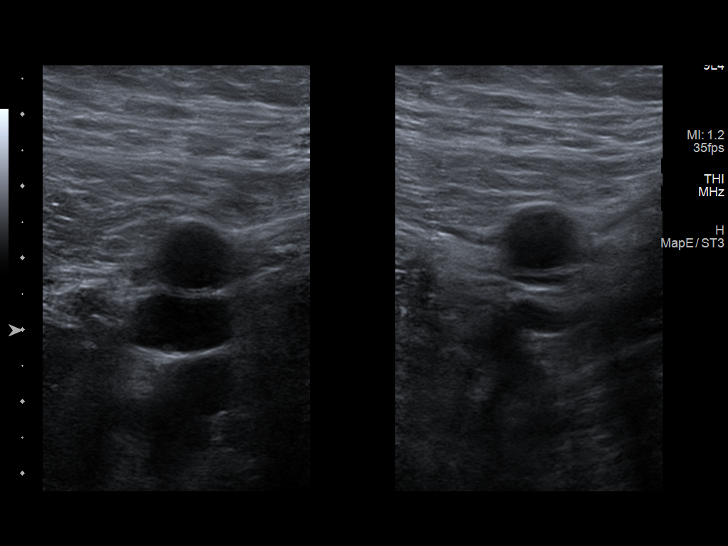
[im 18/50]
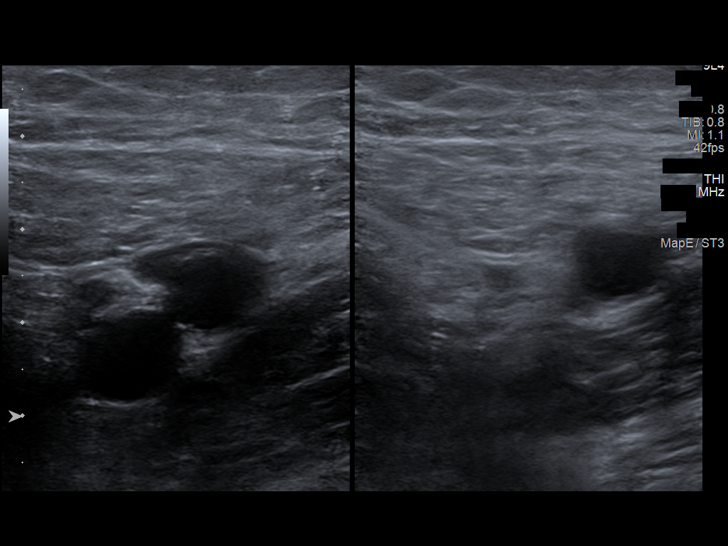
[im 22/50]
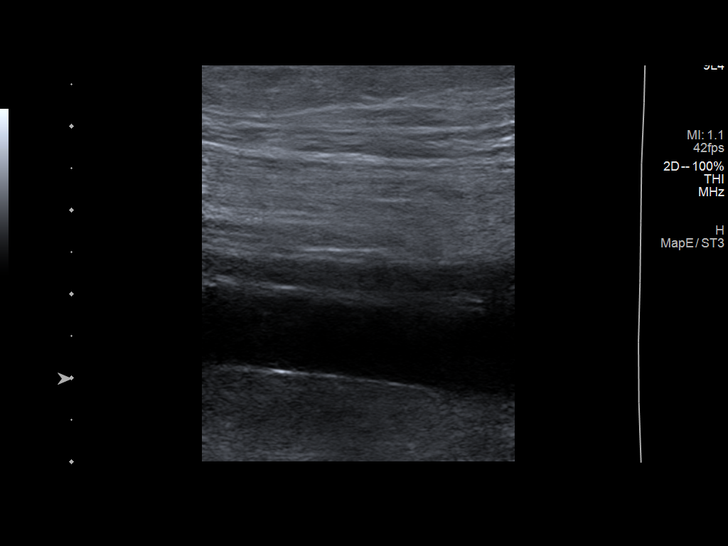
[im 26/50]
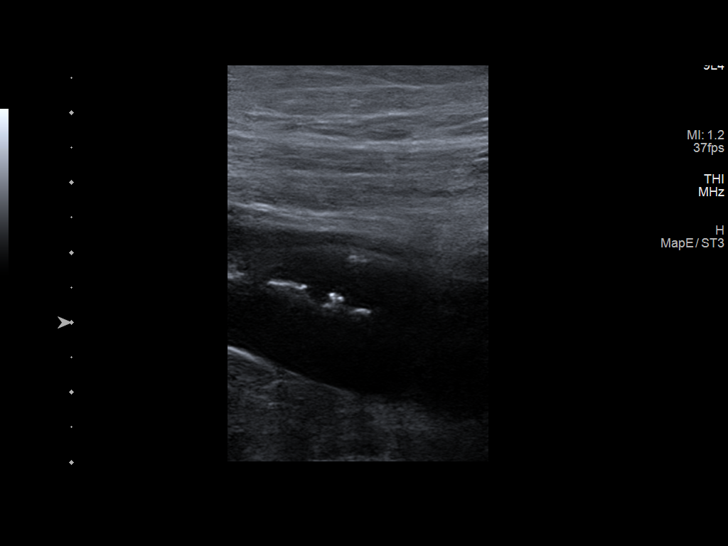
[im 28/50]
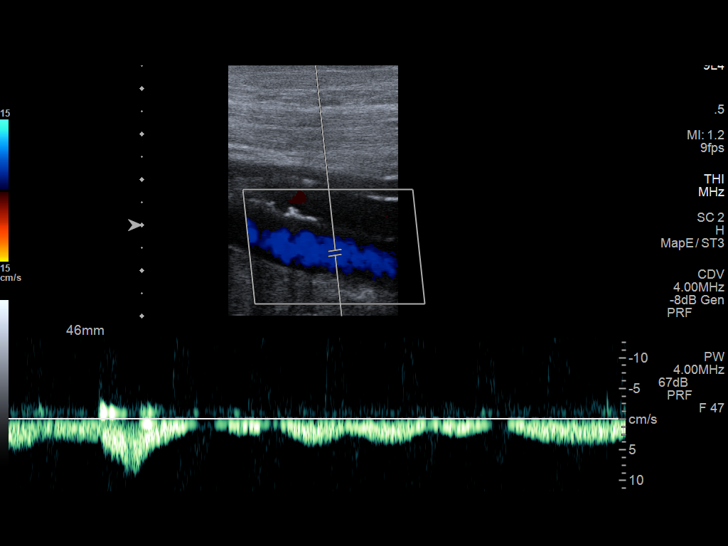
[im 32/50]
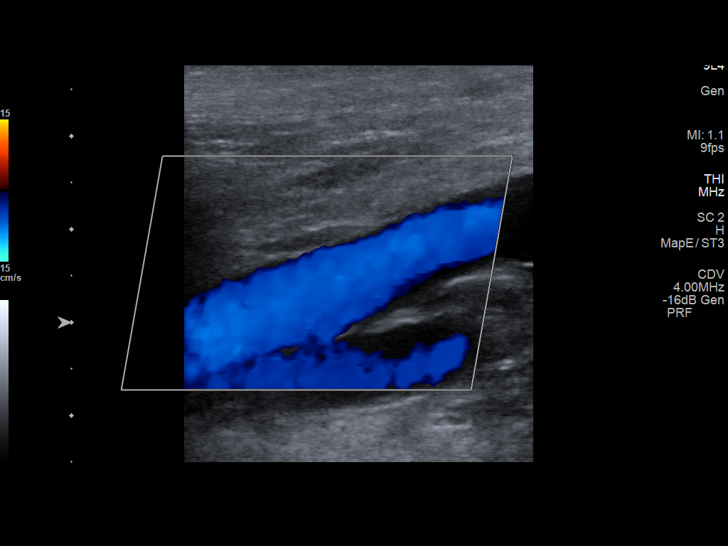
[im 37/50]
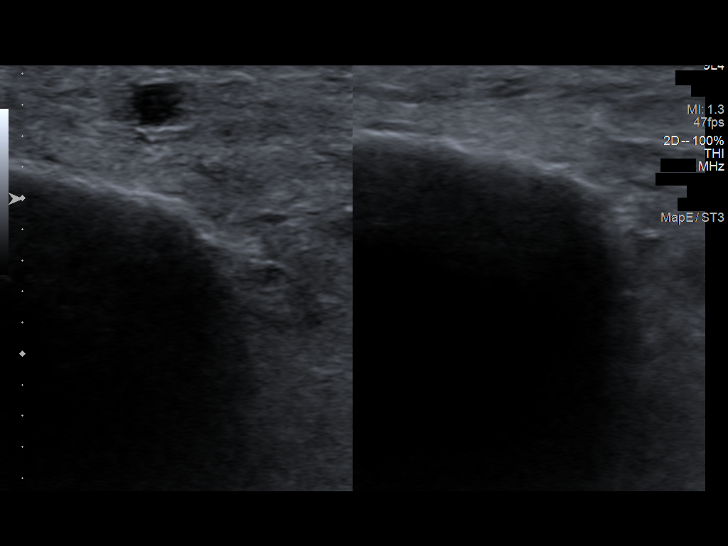
[im 41/50]
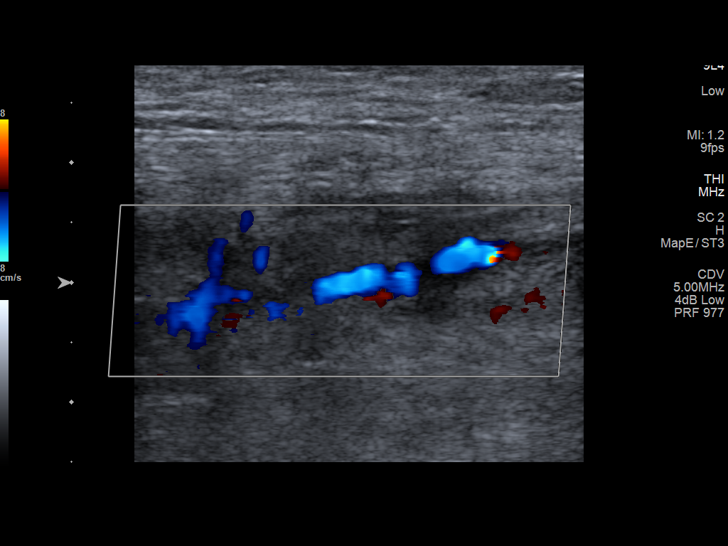
[im 45/50]
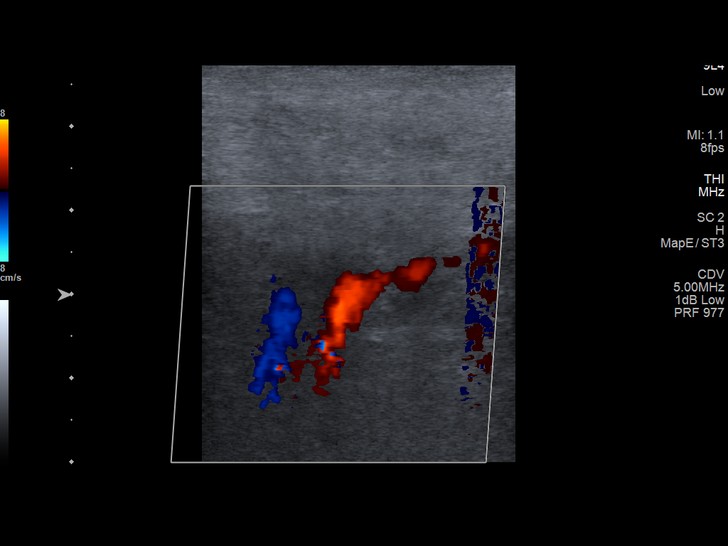
[im 50/50]
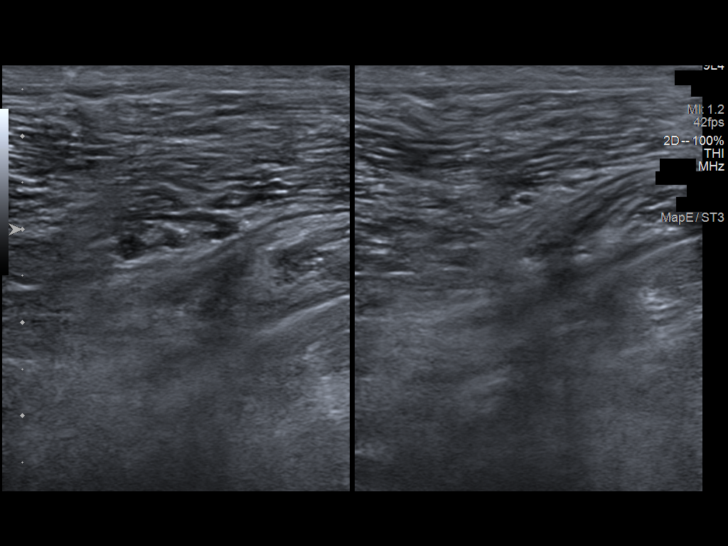

[13 of 24 positions shown; findings below may reference images not displayed]

FINDINGS: Contralateral Common Femoral Vein: Respiratory phasicity is normal
and symmetric with the symptomatic side. No evidence of thrombus.
Normal compressibility.

Common Femoral Vein: No evidence of thrombus. Normal
compressibility, respiratory phasicity and response to augmentation.

Saphenofemoral Junction: No evidence of thrombus. Normal
compressibility and flow on color Doppler imaging.

Profunda Femoral Vein: No evidence of thrombus. Normal
compressibility and flow on color Doppler imaging.

Femoral Vein: No evidence of thrombus. Normal compressibility,
respiratory phasicity and response to augmentation.

Popliteal Vein: No evidence of thrombus. Normal compressibility,
respiratory phasicity and response to augmentation.

Calf Veins: No evidence of thrombus. Normal compressibility and flow
on color Doppler imaging.

Superficial Great Saphenous Vein: No evidence of thrombus. Normal
compressibility and flow on color Doppler imaging.

Other Findings:  None.
IMPRESSION: Sonographic survey of the right lower extremity negative for DVT

## 2020-05-28 ENCOUNTER — Other Ambulatory Visit: Payer: Self-pay | Admitting: *Deleted

## 2020-05-28 ENCOUNTER — Telehealth: Payer: Self-pay

## 2020-05-28 MED ORDER — LISINOPRIL 5 MG PO TABS
5.0000 mg | ORAL_TABLET | Freq: Two times a day (BID) | ORAL | Status: DC
Start: 2020-05-28 — End: 2021-04-19

## 2020-05-28 NOTE — Telephone Encounter (Signed)
I have reviewed the documentation and agree with the written assessment and plan of care.  Katina Remick, FNP-C [facility]  

## 2020-05-28 NOTE — Telephone Encounter (Signed)
Called pt back - Dizziness started this AM when he woke. Dizziness happens often for him = tried some maneuvers  to help = didn't.  Thought they would check BP - it is up all day  Lowest BP today is 174/94  Current BP- taken while on phone.  188/97, heart rate 58 = left arm 190/90, heart rate 56 = right arm Bother upper arm cuff.  No HA, No Chest pain, NO SOB   Current meds for BP: Lisinopril 5 BID Amlodipine 7.5 qAM  Metoprolol 25 OD   Spoke with Marjorie Smolder (back up provider for [facility])   * may take up to 10 mg BID of Lisinopril through the weekend. * may try dramamine OTC for dizziness * follow up Monday AM in office (appt made)  * aware to go to ER if s/s worsen, HA, CP, or SOB starts  *aware of on call provider as well  (will send to [facility] for approval note)

## 2020-05-31 ENCOUNTER — Encounter: Payer: Self-pay | Admitting: Family Medicine

## 2020-05-31 ENCOUNTER — Other Ambulatory Visit: Payer: Self-pay

## 2020-05-31 ENCOUNTER — Ambulatory Visit (INDEPENDENT_AMBULATORY_CARE_PROVIDER_SITE_OTHER): Payer: Medicare Other | Admitting: Family Medicine

## 2020-05-31 VITALS — BP 134/67 | HR 61 | Temp 98.0°F | Ht 72.0 in | Wt 202.0 lb

## 2020-05-31 DIAGNOSIS — I10 Essential (primary) hypertension: Secondary | ICD-10-CM | POA: Diagnosis not present

## 2020-05-31 DIAGNOSIS — R42 Dizziness and giddiness: Secondary | ICD-10-CM | POA: Diagnosis not present

## 2020-05-31 MED ORDER — AMLODIPINE BESYLATE 10 MG PO TABS: 10.0000 mg | ORAL_TABLET | Freq: Every day | ORAL | 3 refills | Status: DC
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
## 2020-05-31 NOTE — Patient Instructions (Signed)

## 2020-06-13 ENCOUNTER — Other Ambulatory Visit: Payer: Self-pay | Admitting: Family Medicine

## 2020-06-13 DIAGNOSIS — F339 Major depressive disorder, recurrent, unspecified: Secondary | ICD-10-CM

## 2020-06-20 ENCOUNTER — Other Ambulatory Visit: Payer: Self-pay | Admitting: Family Medicine

## 2020-07-18 ENCOUNTER — Other Ambulatory Visit: Payer: Self-pay | Admitting: Family Medicine

## 2020-07-18 DIAGNOSIS — K222 Esophageal obstruction: Secondary | ICD-10-CM

## 2020-07-18 DIAGNOSIS — K2100 Gastro-esophageal reflux disease with esophagitis, without bleeding: Secondary | ICD-10-CM

## 2020-07-23 ENCOUNTER — Ambulatory Visit: Payer: Medicare Other | Admitting: Internal Medicine

## 2020-07-26 ENCOUNTER — Ambulatory Visit: Payer: Medicare Other | Admitting: Internal Medicine
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
## 2020-07-28 ENCOUNTER — Ambulatory Visit: Payer: Medicare Other | Admitting: Internal Medicine

## 2020-08-06 DIAGNOSIS — K439 Ventral hernia without obstruction or gangrene: Secondary | ICD-10-CM | POA: Insufficient documentation

## 2020-08-06 DIAGNOSIS — M25471 Effusion, right ankle: Secondary | ICD-10-CM | POA: Insufficient documentation

## 2020-08-07 ENCOUNTER — Other Ambulatory Visit: Payer: Self-pay | Admitting: Family Medicine

## 2020-09-14 ENCOUNTER — Telehealth: Payer: Self-pay | Admitting: Internal Medicine

## 2020-09-14 NOTE — Telephone Encounter (Signed)
Call made to patient wife, confirmed DOB. Aware we have is cpap compliance report via resmed (verified via this user). Voiced understanding.   Nothing further needed at this time.
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
## 2020-09-16 ENCOUNTER — Ambulatory Visit: Payer: Medicare Other | Admitting: Internal Medicine

## 2020-09-29 ENCOUNTER — Ambulatory Visit: Payer: Medicare Other | Admitting: Family Medicine

## 2020-10-05 ENCOUNTER — Other Ambulatory Visit: Payer: Self-pay | Admitting: Family Medicine

## 2020-10-05 DIAGNOSIS — E1169 Type 2 diabetes mellitus with other specified complication: Secondary | ICD-10-CM

## 2020-10-15 ENCOUNTER — Other Ambulatory Visit: Payer: Self-pay | Admitting: Family Medicine

## 2020-10-15 DIAGNOSIS — K2100 Gastro-esophageal reflux disease with esophagitis, without bleeding: Secondary | ICD-10-CM

## 2020-10-15 DIAGNOSIS — K222 Esophageal obstruction: Secondary | ICD-10-CM

## 2020-10-20 ENCOUNTER — Encounter: Payer: Self-pay | Admitting: Family Medicine

## 2020-10-20 ENCOUNTER — Other Ambulatory Visit: Payer: Self-pay

## 2020-10-20 ENCOUNTER — Ambulatory Visit (INDEPENDENT_AMBULATORY_CARE_PROVIDER_SITE_OTHER): Payer: Medicare Other | Admitting: Family Medicine

## 2020-10-20 VITALS — BP 131/67 | HR 64 | Temp 97.3°F | Ht 72.0 in | Wt 203.2 lb

## 2020-10-20 DIAGNOSIS — Z23 Encounter for immunization: Secondary | ICD-10-CM

## 2020-10-20 DIAGNOSIS — F339 Major depressive disorder, recurrent, unspecified: Secondary | ICD-10-CM

## 2020-10-20 DIAGNOSIS — E1159 Type 2 diabetes mellitus with other circulatory complications: Secondary | ICD-10-CM

## 2020-10-20 DIAGNOSIS — I152 Hypertension secondary to endocrine disorders: Secondary | ICD-10-CM | POA: Diagnosis not present

## 2020-10-20 DIAGNOSIS — E1169 Type 2 diabetes mellitus with other specified complication: Secondary | ICD-10-CM | POA: Diagnosis not present

## 2020-10-20 DIAGNOSIS — E785 Hyperlipidemia, unspecified: Secondary | ICD-10-CM
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
## 2020-10-24 ENCOUNTER — Encounter: Payer: Self-pay | Admitting: Family Medicine

## 2020-10-24 MED ORDER — FLUOXETINE HCL 20 MG PO CAPS: 40.0000 mg | ORAL_CAPSULE | Freq: Every day | ORAL | 2 refills | Status: DC

## 2020-11-14 ENCOUNTER — Other Ambulatory Visit: Payer: Self-pay | Admitting: Family Medicine

## 2020-11-14 DIAGNOSIS — E785 Hyperlipidemia, unspecified: Secondary | ICD-10-CM

## 2020-11-14 DIAGNOSIS — E1169 Type 2 diabetes mellitus with other specified complication: Secondary | ICD-10-CM

## 2020-11-20 ENCOUNTER — Other Ambulatory Visit: Payer: Self-pay | Admitting: Family Medicine
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
## 2020-12-02 ENCOUNTER — Other Ambulatory Visit: Payer: Self-pay

## 2020-12-02 ENCOUNTER — Ambulatory Visit (INDEPENDENT_AMBULATORY_CARE_PROVIDER_SITE_OTHER): Payer: Medicare Other | Admitting: Internal Medicine

## 2020-12-02 ENCOUNTER — Encounter: Payer: Self-pay | Admitting: Internal Medicine

## 2020-12-02 DIAGNOSIS — G4733 Obstructive sleep apnea (adult) (pediatric): Secondary | ICD-10-CM | POA: Diagnosis not present

## 2020-12-02 DIAGNOSIS — J3089 Other allergic rhinitis: Secondary | ICD-10-CM

## 2020-12-02 DIAGNOSIS — J302 Other seasonal allergic rhinitis: Secondary | ICD-10-CM

## 2020-12-02 DIAGNOSIS — Z9989 Dependence on other enabling machines and devices: Secondary | ICD-10-CM | POA: Diagnosis not present

## 2020-12-02 NOTE — Patient Instructions (Signed)
We can continue CPAP auto 5-20. You seem to be doing very well.  Please cal if we can help

## 2020-12-03 ENCOUNTER — Other Ambulatory Visit: Payer: Self-pay | Admitting: Family Medicine

## 2020-12-03 DIAGNOSIS — F339 Major depressive disorder, recurrent, unspecified: Secondary | ICD-10-CM

## 2020-12-20 IMAGING — DX DG CHEST 2V
2 series · 2 of 2 positions shown · non-contrast
Comparison: 04/15/2013

CLINICAL DATA: Weakness with chills last night and vomiting x2.

EXAM:
CHEST - 2 VIEW

[chest pa]
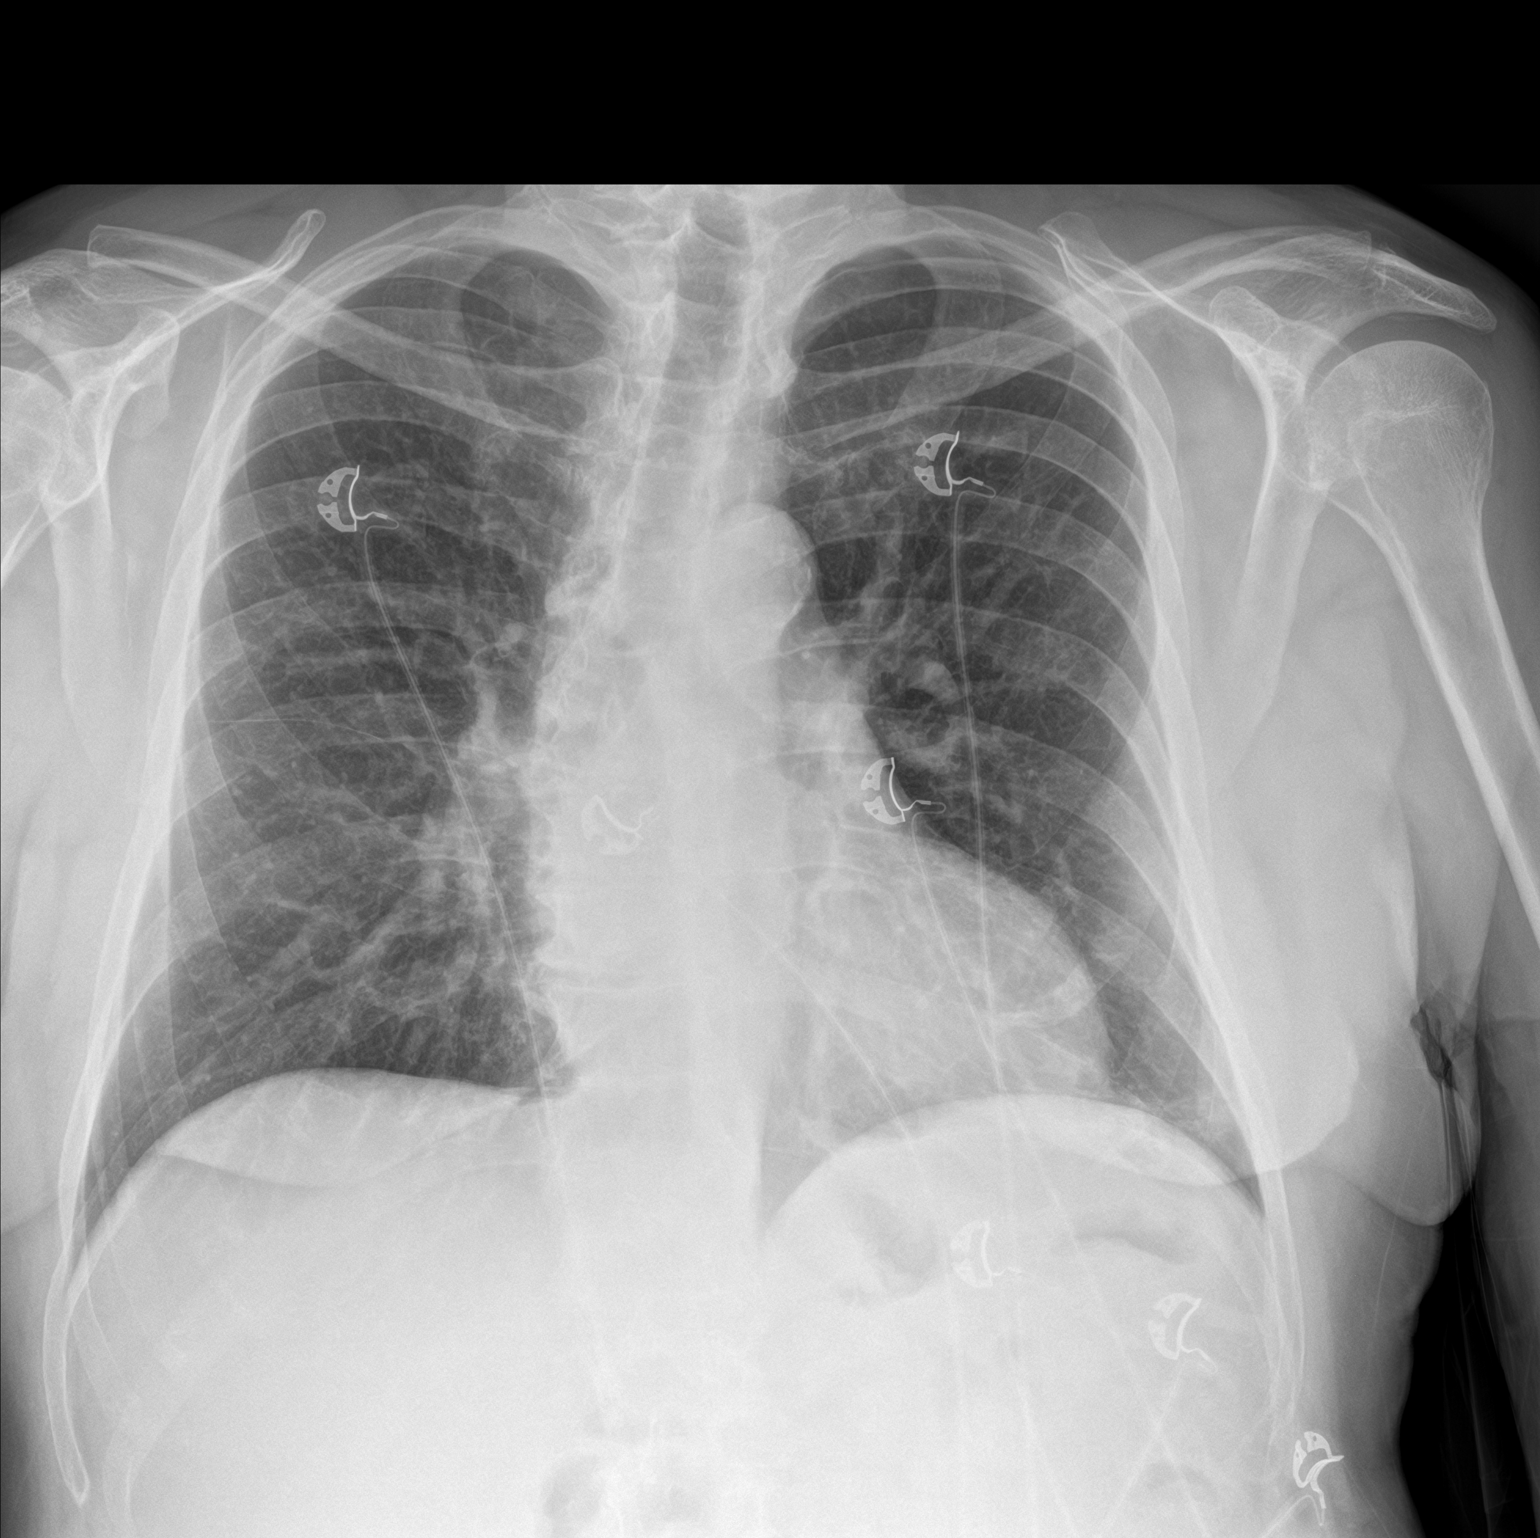

[chest lat]
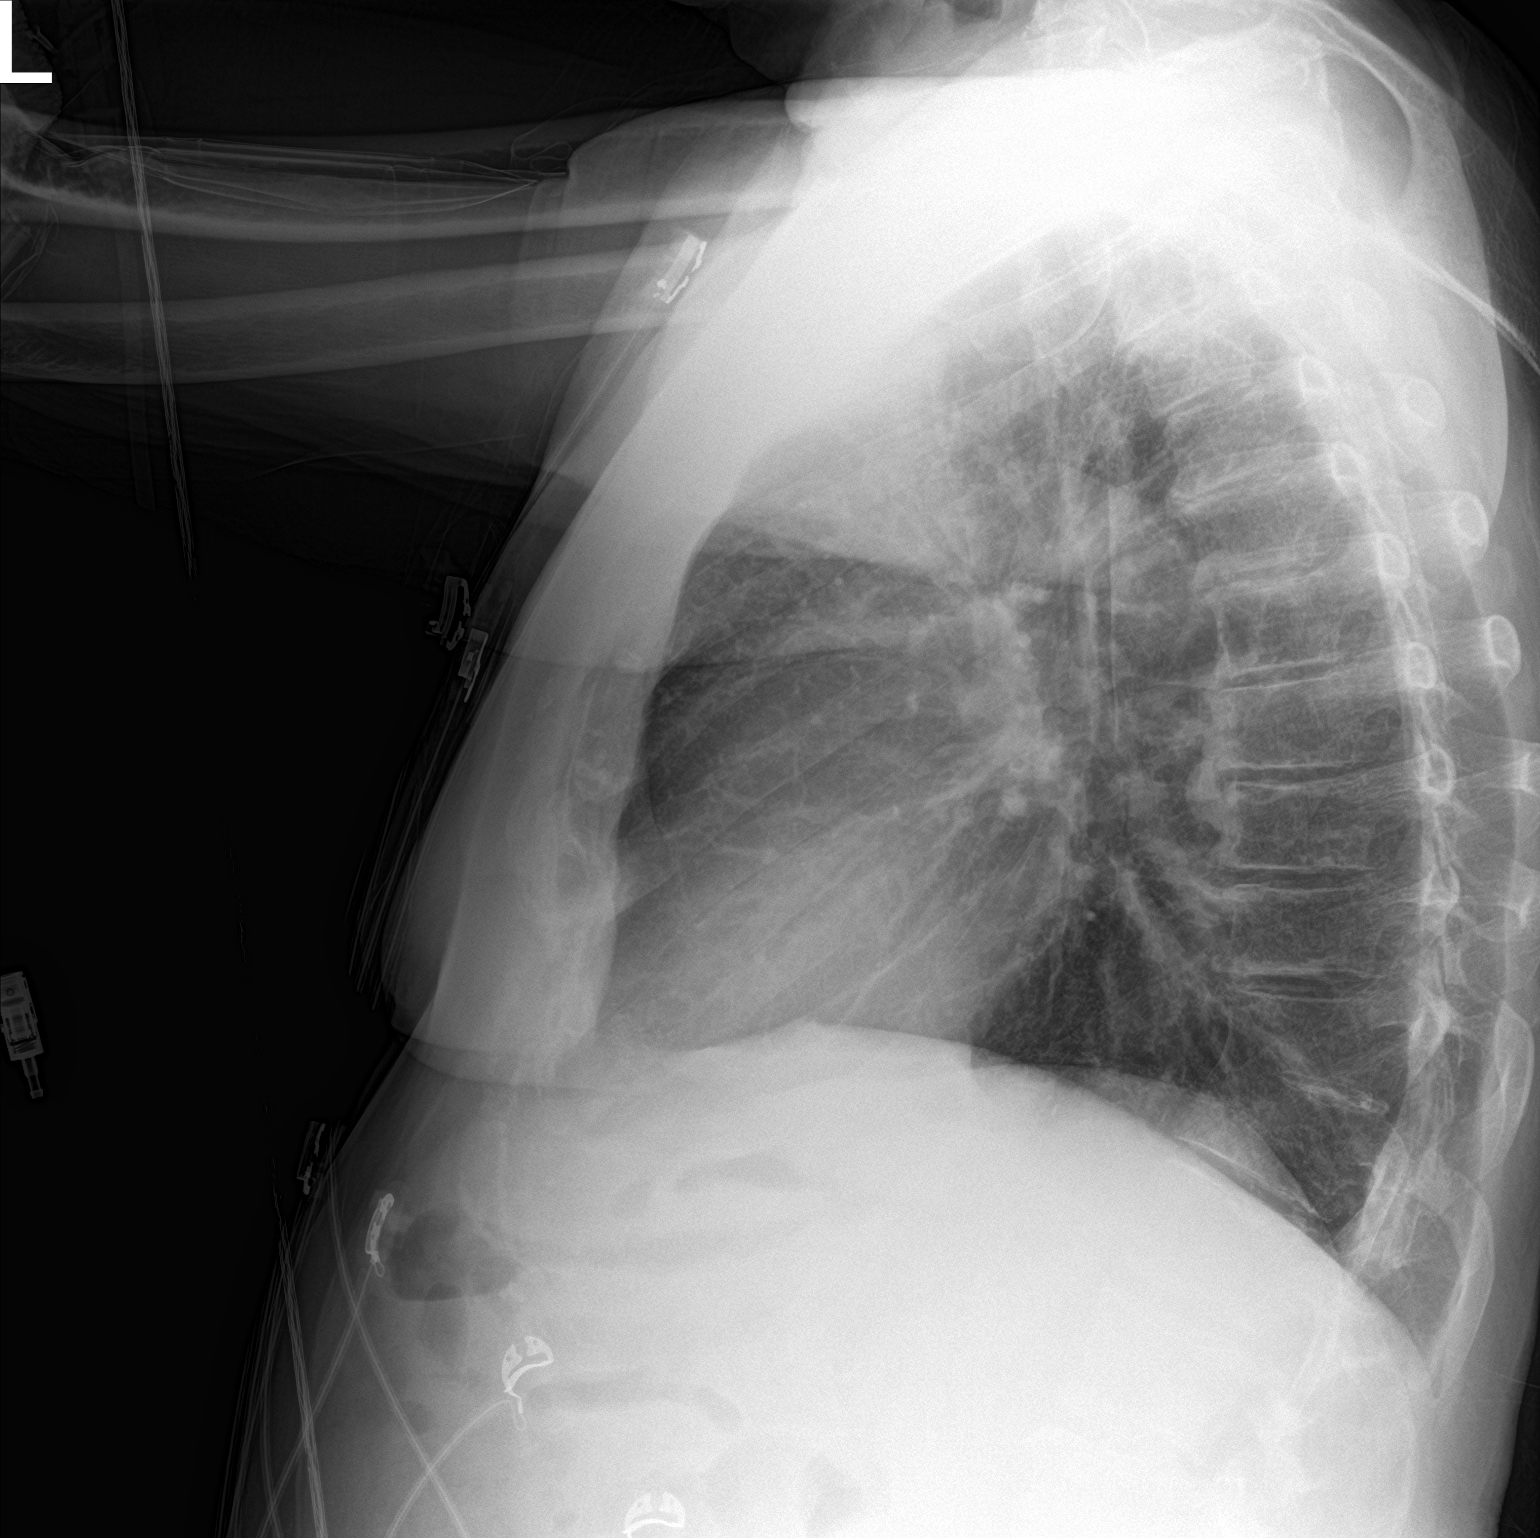

[2 of 2 positions shown; findings below may reference images not displayed]

FINDINGS: Lungs are adequately inflated. There is no focal airspace
consolidation or effusion. Cardiomediastinal silhouette and
remainder of the exam is unchanged to include partial fusion of 2
lower thoracic vertebral bodies.
IMPRESSION: No active cardiopulmonary disease.

## 2020-12-23 ENCOUNTER — Ambulatory Visit (INDEPENDENT_AMBULATORY_CARE_PROVIDER_SITE_OTHER): Payer: Medicare Other

## 2020-12-23 VITALS — Ht 72.0 in | Wt 204.0 lb

## 2020-12-23 DIAGNOSIS — Z0000 Encounter for general adult medical examination without abnormal findings: Secondary | ICD-10-CM | POA: Diagnosis not present
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
## 2020-12-23 NOTE — Patient Instructions (Signed)
Melvin [name] , Thank you for taking time to come for your Medicare Wellness Visit. I appreciate your ongoing commitment to your health goals. Please review the following plan we discussed and let me know if I can assist you in the future.   Screening recommendations/referrals: Colonoscopy: Done 2019 at [facility] Recommended yearly ophthalmology/optometry visit for glaucoma screening and checkup Recommended yearly dental visit for hygiene and checkup  Vaccinations: Influenza vaccine: Done 10/20/2020 - Repeat annually  Pneumococcal vaccine: Done 03/12/2019 - ask about Pneumovax-23 Tdap vaccine: Done 06/05/2017 - Repeat in 10 years  Shingles vaccine: Due   Covid-19: Done 04/18/2019, 05/19/2019, & 12/17/2019  Advanced directives: Please bring a copy of your health care power of attorney and living will to the office to be added to your chart at your convenience.   Conditions/risks identified: Aim for 30 minutes of exercise or brisk walking each day, drink 6-8 glasses of water and eat lots of fruits and vegetables.   Next appointment: Follow up in one year for your annual wellness visit.   Preventive Care 25 Years and Older, Male  Preventive care refers to lifestyle choices and visits with your health care provider that can promote health and wellness. What does preventive care include? A yearly physical exam. This is also called an annual well check. Dental exams once or twice a year. Routine eye exams. Ask your health care provider how often you should have your eyes checked. Personal lifestyle choices, including: Daily care of your teeth and gums. Regular physical activity. Eating a healthy diet. Avoiding tobacco and drug use. Limiting alcohol use. Practicing safe sex. Taking low doses of aspirin every day. Taking vitamin and mineral supplements as recommended by your health care provider. What happens during an annual well check? The services and screenings done by your health care provider  during your annual well check will depend on your age, overall health, lifestyle risk factors, and family history of disease. Counseling  Your health care provider may ask you questions about your: Alcohol use. Tobacco use. Drug use. Emotional well-being. Home and relationship well-being. Sexual activity. Eating habits. History of falls. Memory and ability to understand (cognition). Work and work Statistician. Screening  You may have the following tests or measurements: Height, weight, and BMI. Blood pressure. Lipid and cholesterol levels. These may be checked every 5 years, or more frequently if you are over 80 years old. Skin check. Lung cancer screening. You may have this screening every year starting at age 43 if you have a 30-pack-year history of smoking and currently smoke or have quit within the past 15 years. Fecal occult blood test (FOBT) of the stool. You may have this test every year starting at age 78. Flexible sigmoidoscopy or colonoscopy. You may have a sigmoidoscopy every 5 years or a colonoscopy every 10 years starting at age 43. Prostate cancer screening. Recommendations will vary depending on your family history and other risks. Hepatitis C blood test. Hepatitis B blood test. Sexually transmitted disease (STD) testing. Diabetes screening. This is done by checking your blood sugar (glucose) after you have not eaten for a while (fasting). You may have this done every 1-3 years. Abdominal aortic aneurysm (AAA) screening. You may need this if you are a current or former smoker. Osteoporosis. You may be screened starting at age 68 if you are at high risk. Talk with your health care provider about your test results, treatment options, and if necessary, the need for more tests. Vaccines  Your health care provider  may recommend certain vaccines, such as: Influenza vaccine. This is recommended every year. Tetanus, diphtheria, and acellular pertussis (Tdap, Td) vaccine. You  may need a Td booster every 10 years. Zoster vaccine. You may need this after age 53. Pneumococcal 13-valent conjugate (PCV13) vaccine. One dose is recommended after age 36. Pneumococcal polysaccharide (PPSV23) vaccine. One dose is recommended after age 80. Talk to your health care provider about which screenings and vaccines you need and how often you need them. This information is not intended to replace advice given to you by your health care provider. Make sure you discuss any questions you have with your health care provider. Document Released: 02/19/2015 Document Revised: 10/13/2015 Document Reviewed: 11/24/2014 Elsevier Interactive Patient Education  2017 [place] Prevention in the Home Falls can cause injuries. They can happen to people of all ages. There are many things you can do to make your home safe and to help prevent falls. What can I do on the outside of my home? Regularly fix the edges of walkways and driveways and fix any cracks. Remove anything that might make you trip as you walk through a door, such as a raised step or threshold. Trim any bushes or trees on the path to your home. Use bright outdoor lighting. Clear any walking paths of anything that might make someone trip, such as rocks or tools. Regularly check to see if handrails are loose or broken. Make sure that both sides of any steps have handrails. Any raised decks and porches should have guardrails on the edges. Have any leaves, snow, or ice cleared regularly. Use sand or salt on walking paths during winter. Clean up any spills in your garage right away. This includes oil or grease spills. What can I do in the bathroom? Use night lights. Install grab bars by the toilet and in the tub and shower. Do not use towel bars as grab bars. Use non-skid mats or decals in the tub or shower. If you need to sit down in the shower, use a plastic, non-slip stool. Keep the floor dry. Clean up any water that spills  on the floor as soon as it happens. Remove soap buildup in the tub or shower regularly. Attach bath mats securely with double-sided non-slip rug tape. Do not have throw rugs and other things on the floor that can make you trip. What can I do in the bedroom? Use night lights. Make sure that you have a light by your bed that is easy to reach. Do not use any sheets or blankets that are too big for your bed. They should not hang down onto the floor. Have a firm chair that has side arms. You can use this for support while you get dressed. Do not have throw rugs and other things on the floor that can make you trip. What can I do in the kitchen? Clean up any spills right away. Avoid walking on wet floors. Keep items that you use a lot in easy-to-reach places. If you need to reach something above you, use a strong step stool that has a grab bar. Keep electrical cords out of the way. Do not use floor polish or wax that makes floors slippery. If you must use wax, use non-skid floor wax. Do not have throw rugs and other things on the floor that can make you trip. What can I do with my stairs? Do not leave any items on the stairs. Make sure that there are handrails on both sides  of the stairs and use them. Fix handrails that are broken or loose. Make sure that handrails are as long as the stairways. Check any carpeting to make sure that it is firmly attached to the stairs. Fix any carpet that is loose or worn. Avoid having throw rugs at the top or bottom of the stairs. If you do have throw rugs, attach them to the floor with carpet tape. Make sure that you have a light switch at the top of the stairs and the bottom of the stairs. If you do not have them, ask someone to add them for you. What else can I do to help prevent falls? Wear shoes that: Do not have high heels. Have rubber bottoms. Are comfortable and fit you well. Are closed at the toe. Do not wear sandals. If you use a stepladder: Make  sure that it is fully opened. Do not climb a closed stepladder. Make sure that both sides of the stepladder are locked into place. Ask someone to hold it for you, if possible. Clearly mark and make sure that you can see: Any grab bars or handrails. First and last steps. Where the edge of each step is. Use tools that help you move around (mobility aids) if they are needed. These include: Canes. Walkers. Scooters. Crutches. Turn on the lights when you go into a dark area. Replace any light bulbs as soon as they burn out. Set up your furniture so you have a clear path. Avoid moving your furniture around. If any of your floors are uneven, fix them. If there are any pets around you, be aware of where they are. Review your medicines with your doctor. Some medicines can make you feel dizzy. This can increase your chance of falling. Ask your doctor what other things that you can do to help prevent falls. This information is not intended to replace advice given to you by your health care provider. Make sure you discuss any questions you have with your health care provider. Document Released: 11/19/2008 Document Revised: 07/01/2015 Document Reviewed: 02/27/2014 Elsevier Interactive Patient Education  2017 [employer].

## 2020-12-31 ENCOUNTER — Encounter (HOSPITAL_COMMUNITY): Payer: Self-pay

## 2020-12-31 ENCOUNTER — Emergency Department (HOSPITAL_COMMUNITY)
Admission: EM | Admit: 2020-12-31 | Discharge: 2020-12-31 | Disposition: A | Discharge status: Home / Self Care | Payer: Medicare Other | Attending: Emergency Medicine | Admitting: Emergency Medicine

## 2020-12-31 ENCOUNTER — Other Ambulatory Visit: Payer: Self-pay

## 2020-12-31 DIAGNOSIS — Z87891 Personal history of nicotine dependence: Secondary | ICD-10-CM | POA: Diagnosis not present

## 2020-12-31 DIAGNOSIS — R2242 Localized swelling, mass and lump, left lower limb: Secondary | ICD-10-CM | POA: Diagnosis present

## 2020-12-31 DIAGNOSIS — Z96642 Presence of left artificial hip joint: Secondary | ICD-10-CM | POA: Insufficient documentation

## 2020-12-31 DIAGNOSIS — Z79899 Other long term (current) drug therapy: Secondary | ICD-10-CM | POA: Insufficient documentation

## 2020-12-31 DIAGNOSIS — Z7984 Long term (current) use of oral hypoglycemic drugs: Secondary | ICD-10-CM | POA: Diagnosis not present

## 2020-12-31 DIAGNOSIS — Z8616 Personal history of COVID-19: Secondary | ICD-10-CM | POA: Insufficient documentation

## 2020-12-31 DIAGNOSIS — E1141 Type 2 diabetes mellitus with diabetic mononeuropathy: Secondary | ICD-10-CM | POA: Diagnosis not present

## 2020-12-31 DIAGNOSIS — Z7982 Long term (current) use of aspirin: Secondary | ICD-10-CM | POA: Diagnosis not present

## 2020-12-31 DIAGNOSIS — I10 Essential (primary) hypertension: Secondary | ICD-10-CM | POA: Diagnosis not present

## 2020-12-31 DIAGNOSIS — Z85828 Personal history of other malignant neoplasm of skin: Secondary | ICD-10-CM | POA: Insufficient documentation

## 2020-12-31 DIAGNOSIS — M25422 Effusion, left elbow: Secondary | ICD-10-CM

## 2020-12-31 LAB — URINALYSIS, MICROSCOPIC (REFLEX): Bacteria, UA: NONE SEEN

## 2020-12-31 LAB — URINALYSIS, ROUTINE W REFLEX MICROSCOPIC
Bilirubin Urine: NEGATIVE
Glucose, UA: >=500 mg/dL — AB
Hgb urine dipstick: NEGATIVE
Ketones, ur: NEGATIVE mg/dL
Leukocytes,Ua: NEGATIVE
Nitrite: NEGATIVE
Protein, ur: NEGATIVE mg/dL
Specific Gravity, Urine: 1.01 (ref 1.005–1.030)
pH: 6.5 (ref 5.0–8.0)

## 2020-12-31 MED ORDER — AMOXICILLIN-POT CLAVULANATE 875-125 MG PO TABS: 1.0000 | ORAL_TABLET | Freq: Two times a day (BID) | ORAL | 0 refills | Status: DC

## 2020-12-31 MED ORDER — AMOXICILLIN-POT CLAVULANATE 875-125 MG PO TABS
1.0000 | ORAL_TABLET | Freq: Once | ORAL | Status: AC
  Administered 2020-12-31: 1 via ORAL
  Filled 2020-12-31: qty 1

## 2020-12-31 NOTE — ED Provider Notes (Signed)
[facility] EMERGENCY DEPARTMENT Provider Note   CSN: [id number] Arrival date & time: 12/31/20  1345     History Chief Complaint  Patient presents with   Arm Swelling    Melvin [name]. is a 70 y.o. male.  Patient has had some chronic swelling to the left elbow.  Recently on a course of Augmentin with improvement followed by [facility].  Last saw them on November 16.  But at that time things were doing better.  Past history is that he had a distal radius fracture that required a bone graft from his olecranon process and that left elbow area many years ago but there was some concern that there could be osteomyelitis.  According to family members they did do an MRI and it was negative.  Patient does not have a history of gout.  He states that the arm started swelling last evening.  More at the elbow and now has some swelling at the wrist area.  Also concerned that he may have a urinary tract infection because he was not feeling very well last evening feeling better today and when that occurs sometimes he has a urinary tract infection.  Of significance orthopedics was not concerned about a septic arthritis.      Past Medical History:  Diagnosis Date   Acute blood loss anemia    BCC (basal cell carcinoma), face    Degeneration of intervertebral disc, site unspecified    Depressive disorder, not elsewhere classified    Esophageal stricture    Essential hypertension, benign    GERD (gastroesophageal reflux disease)    Hiatal hernia    Hyperplasia of prostate    Osteoarthrosis and allied disorders    Postoperative leak    Sleep apnea    Trauma 06/27/2017   UTI due to Klebsiella species     Patient Active Problem List   Diagnosis Date Noted   Right ankle swelling 08/06/2020   Ventral hernia without obstruction or gangrene 08/06/2020   NSVT (nonsustained ventricular tachycardia) 10/31/2019   Screening for ischemic heart disease (IHD) 10/31/2019   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   COVID-19 virus infection 07/26/2019   History of malignant melanoma of skin 06/25/2019   Diabetic neuropathy associated with type 2 diabetes mellitus ([place]) 04/08/2019   History of COVID-19 04/08/2019   Major depressive disorder, recurrent, moderate ([place]) 04/08/2019   Thrombocytopenia ([place]) 11/22/2018   Gait instability 11/12/2018   UTI (urinary tract infection) 11/09/2018   Vitamin D deficiency 10/09/2018   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, type I or II, with nonunion, subsequent encounter 01/25/2018   Multiple closed fractures of pelvis with unstable disruption of pelvic ring ([place]) 09/18/2017   Obstructive sleep apnea on CPAP 08/24/2017   BPH (benign prostatic hyperplasia) 08/24/2017   AKI (acute kidney injury) ([place]) 08/24/2017   Hypomagnesemia 08/24/2017   Hypoalbuminemia due to protein-calorie malnutrition ([place]) 08/24/2017   Type I or II open fracture of left radius with routine healing 07/13/2017   Slow transit constipation    Dry eyes    History of multiple trauma    Sleep disturbance    History of pulmonary embolism 06/27/2017   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 06/27/2017   Peri-prosthetic femur fracture at tip of prosthesis 06/27/2017   Seasonal and perennial allergic rhinitis 02/17/2016   Type 2 diabetes mellitus with diabetic mononeuropathy, without long-term current use of insulin ([place]) 02/26/2015   Erectile dysfunction 04/15/2013  Statin intolerance 04/15/2013   Hyperlipidemia associated with type 2 diabetes mellitus ([place]) 04/15/2013   Hypertension associated with type 2 diabetes mellitus ([place]) 10/18/2012   BCC (basal cell carcinoma), face    Hiatal hernia with gastroesophageal reflux     Degeneration of intervertebral disc, site unspecified    Esophageal stricture, history of    Depression, recurrent (HCC)    Osteoarthritis     Past Surgical History:  Procedure Laterality Date   ABDOMINAL SURGERY      ANKLE FRACTURE SURGERY     APPENDECTOMY  11-1992   FEMUR FRACTURE SURGERY     JOINT REPLACEMENT     Left Hip REplaacement  01-1999   LUMBAR FUSION  05-1998   NASAL SEPTUM SURGERY  1989   PELVIC FRACTURE SURGERY     Right Hip Replacement  09-2002   SPINE SURGERY     VENTRAL HERNIA REPAIR  02-2000   WRIST FRACTURE SURGERY         Family History  Adopted: Yes  Problem Relation Age of Onset   Lymphoma Mother    Arthritis Mother    Asthma Mother    Kidney failure Father    Hypertension Father    Hypertension Brother    Hyperlipidemia Brother    Bipolar disorder Brother    Breast cancer Sister     Social History   Tobacco Use   Smoking status: Former    Types: Cigars    Quit date: 1979    Years since quitting: 43.9   Smokeless tobacco: Never  Vaping Use   Vaping Use: Never used  Substance Use Topics   Alcohol use: Not Currently   Drug use: No    Home Medications Prior to Admission medications   Medication Sig Start Date End Date Taking? Authorizing Provider  amLODipine (NORVASC) 10 MG tablet Take 1 tablet (10 mg total) by mouth daily. 05/31/20  Yes Gwenlyn Perking, FNP  amoxicillin-clavulanate (AUGMENTIN) 875-125 MG tablet Take 1 tablet by mouth every 12 (twelve) hours. 12/31/20  Yes Fredia Sorrow, MD  Ascorbic Acid (VITAMIN C) 1000 MG tablet Take 1,000 mg by mouth daily.   Yes [provider]  aspirin 81 MG EC tablet Take 1 tablet (81 mg total) by mouth daily. 07/10/17  Yes Love, Ivan Anchors, PA-C  empagliflozin (JARDIANCE) 10 MG TABS tablet Take 1 tablet by mouth daily with breakfast. 03/24/20 03/24/21 Yes [provider]  ezetimibe (ZETIA) 10 MG tablet TAKE ONE (1) TABLET EACH DAY Patient taking differently: Take 10 mg by mouth daily. 11/15/20  Yes Claretta Fraise, MD  finasteride (PROPECIA) 1 MG tablet TAKE ONE (1) TABLET EACH DAY Patient taking differently: Take 1 mg by mouth daily. 06/21/20  Yes Claretta Fraise, MD  FLUoxetine (PROZAC) 20 MG  capsule Take 4 capsules (80 mg total) by mouth daily. 12/06/20  Yes Stacks, Cletus Gash, MD  fluticasone (FLONASE) 50 MCG/ACT nasal spray USE 1 TO 2 SPRAYS IN EACH NOSTRIL DAILY 08/10/20  Yes Stacks, Cletus Gash, MD  gabapentin (NEURONTIN) 100 MG capsule TAKE 3 CAPSULES THREE TIMES A DAY Patient taking differently: 300 mg. 04/28/20  Yes Claretta Fraise, MD  ibuprofen (ADVIL) 800 MG tablet TAKE ONE TABLET 3 TIMES A DAY AS NEEDED. Patient taking differently: Take 800 mg by mouth 3 (three) times daily. 12/06/20  Yes Stacks, Cletus Gash, MD  lisinopril (ZESTRIL) 5 MG tablet Take 1 tablet (5 mg total) by mouth 2 (two) times daily. 05/28/20  Yes Claretta Fraise, MD  Melatonin 3 MG TABS  Take 1 tablet (3 mg total) by mouth at bedtime. Patient taking differently: Take 6 mg by mouth at bedtime. 07/10/17  Yes Love, Ivan Anchors, PA-C  metFORMIN (GLUCOPHAGE-XR) 500 MG 24 hr tablet Take 1 tablet (500 mg total) by mouth 2 (two) times daily. (Needs to be seen before next refill) Patient taking differently: Take 500 mg by mouth See admin instructions. Take 1000 mg in the morning and then 500 mg at noon and 500 mg every evening 03/26/20  Yes Stacks, Cletus Gash, MD  metoprolol succinate (TOPROL-XL) 25 MG 24 hr tablet Take 1 tablet by mouth daily. 12/28/19  Yes [provider]  Misc Natural Products (OSTEO BI-FLEX JOINT SHIELD PO) Take 2 tablets by mouth daily.   Yes [provider]  Multiple Vitamin (MULTIVITAMIN WITH MINERALS) TABS tablet Take 1 tablet by mouth daily. 07/10/17  Yes Love, Ivan Anchors, PA-C  Olopatadine HCl 0.2 % SOLN Place 1 drop into both eyes daily.   Yes [provider]  omega-3 acid ethyl esters (LOVAZA) 1 g capsule TAKE 4 CAPSULES DAILY 04/14/20  Yes Stacks, Cletus Gash, MD  pantoprazole (PROTONIX) 40 MG tablet TAKE ONE (1) TABLET EACH DAY Patient taking differently: Take 40 mg by mouth daily. 10/18/20  Yes Claretta Fraise, MD  protein supplement shake (PREMIER PROTEIN) LIQD Take 325 mLs (11 oz total) by mouth 3  (three) times daily with meals. Patient taking differently: Take 11 oz by mouth 2 (two) times daily as needed (for supplement). 07/10/17  Yes Love, Ivan Anchors, PA-C  sildenafil (VIAGRA) 100 MG tablet TAKE 1 TABLET BY MOUTH ONCE DAILY FOR ERECTILE DYSFUNCTION Patient taking differently: Take 100 mg by mouth as needed for erectile dysfunction. 11/22/20  Yes Stacks, Cletus Gash, MD  PAZEO 0.7 % SOLN Place 1 drop into both ears daily.  Patient not taking: Reported on 12/31/2020 09/25/18   [provider]    Allergies    Ativan [lorazepam], Dilaudid [hydromorphone], Morphine, Seroquel [quetiapine], and Statins  Review of Systems   Review of Systems  Constitutional:  Negative for chills and fever.  HENT:  Negative for ear pain and sore throat.   Eyes:  Negative for pain and visual disturbance.  Respiratory:  Negative for cough and shortness of breath.   Cardiovascular:  Negative for chest pain and palpitations.  Gastrointestinal:  Negative for abdominal pain and vomiting.  Genitourinary:  Negative for dysuria and hematuria.  Musculoskeletal:  Positive for joint swelling. Negative for arthralgias and back pain.  Skin:  Negative for color change and rash.  Neurological:  Negative for seizures and syncope.  All other systems reviewed and are negative.  Physical Exam Updated Vital Signs BP 135/68   Pulse 65   Temp 98.4 F (36.9 C) (Oral)   Resp 18   Ht 1.829 m (6')   Wt 92.1 kg   SpO2 96%   BMI 27.53 kg/m   Physical Exam Vitals and nursing note reviewed.  Constitutional:      General: He is not in acute distress.    Appearance: Normal appearance. He is well-developed.  HENT:     Head: Normocephalic and atraumatic.  Eyes:     Extraocular Movements: Extraocular movements intact.     Conjunctiva/sclera: Conjunctivae normal.     Pupils: Pupils are equal, round, and reactive to light.  Cardiovascular:     Rate and Rhythm: Normal rate and regular rhythm.     Heart sounds: No murmur  heard. Pulmonary:     Effort: Pulmonary effort is normal.  No respiratory distress.     Breath sounds: Normal breath sounds.  Abdominal:     Palpations: Abdomen is soft.     Tenderness: There is no abdominal tenderness.  Musculoskeletal:        General: Swelling and tenderness present.     Cervical back: Normal range of motion and neck supple.     Comments: Patient with swelling around the left elbow area.  No distinct erythema some slight increased warmth.  Also with some swelling at the left wrist area.  Radial pulses 2+.  Good cap refill.  Skin:    General: Skin is warm and dry.     Capillary Refill: Capillary refill takes less than 2 seconds.  Neurological:     Mental Status: He is alert.  Psychiatric:        Mood and Affect: Mood normal.    ED Results / Procedures / Treatments   Labs (all labs ordered are listed, but only abnormal results are displayed) Labs Reviewed  URINALYSIS, ROUTINE W REFLEX MICROSCOPIC - Abnormal; Notable for the following components:      Result Value   APPearance HAZY (*)    Glucose, UA >=500 (*)    All other components within normal limits  URINALYSIS, MICROSCOPIC (REFLEX)    EKG None  Radiology No results found.  Procedures Procedures   Medications Ordered in ED Medications  amoxicillin-clavulanate (AUGMENTIN) 875-125 MG per tablet 1 tablet (1 tablet Oral Given 12/31/20 1557)    ED Course  I have reviewed the triage vital signs and the nursing notes.  Pertinent labs & imaging results that were available during my care of the patient were reviewed by me and considered in my medical decision making (see chart for details).    MDM Rules/Calculators/A&P                           States that he has been recovering from the flu.  But that is improved significantly in the last few days.  Patient did not want a complete work-up to see why he felt bad last evening.  All he wanted was to have his urine checked and to get restarted on  antibiotics which seems appropriate so we will restart him on Augmentin first dose given here.  And patient will continue Augmentin for the next 7 days we will follow back up with [facility].  He will return for any new or worse symptoms.   Final Clinical Impression(s) / ED Diagnoses Final diagnoses:  Elbow swelling, left    Rx / DC Orders ED Discharge Orders          Ordered    amoxicillin-clavulanate (AUGMENTIN) 875-125 MG tablet  Every 12 hours        12/31/20 1658             Fredia Sorrow, MD 12/31/20 1705

## 2020-12-31 NOTE — ED Triage Notes (Signed)
History of infection in left arm recently treated with antibiotics. Pt developed swelling and pain in left arm again yesterday.

## 2020-12-31 NOTE — Discharge Instructions (Signed)
Take the antibiotic Augmentin as directed for the next 7 days.  Make an appointment to follow-up with your [facility] orthopedic surgeon.  Return for any new or worse symptoms.  Urinalysis was negative for any signs of infection.

## 2021-01-03 ENCOUNTER — Other Ambulatory Visit: Payer: Self-pay

## 2021-01-03 ENCOUNTER — Emergency Department (HOSPITAL_BASED_OUTPATIENT_CLINIC_OR_DEPARTMENT_OTHER): Payer: Medicare Other

## 2021-01-03 DIAGNOSIS — M79602 Pain in left arm: Secondary | ICD-10-CM | POA: Diagnosis present

## 2021-01-03 DIAGNOSIS — Z5321 Procedure and treatment not carried out due to patient leaving prior to being seen by health care provider: Secondary | ICD-10-CM | POA: Insufficient documentation

## 2021-01-03 NOTE — ED Triage Notes (Signed)
Patient seen at [facility] today and was told to be seen in an ED for [place] of left arm to rule out clot.

## 2021-01-04 ENCOUNTER — Emergency Department (HOSPITAL_BASED_OUTPATIENT_CLINIC_OR_DEPARTMENT_OTHER)
Admission: EM | Admit: 2021-01-04 | Discharge: 2021-01-04 | Discharge status: Against Medical Advice | Payer: Medicare Other | Attending: Emergency Medicine | Admitting: Emergency Medicine

## 2021-01-17 ENCOUNTER — Other Ambulatory Visit: Payer: Self-pay | Admitting: Family Medicine

## 2021-01-17 DIAGNOSIS — K222 Esophageal obstruction: Secondary | ICD-10-CM

## 2021-01-17 DIAGNOSIS — K2100 Gastro-esophageal reflux disease with esophagitis, without bleeding: Secondary | ICD-10-CM

## 2021-03-25 ENCOUNTER — Encounter: Payer: Self-pay | Admitting: Internal Medicine

## 2021-03-25 NOTE — Assessment & Plan Note (Addendum)
Continues to benefit from CPAP with good compliance and control. Plan-continue auto 5-20.  He will contact DME adapt as needed.

## 2021-03-25 NOTE — Assessment & Plan Note (Signed)
He will watch for spring pollen rhinitis with the season change.  Flonase and antihistamines should be sufficient.  Do not expect it to interfere with the CPAP use.

## 2021-04-05 ENCOUNTER — Other Ambulatory Visit: Payer: Self-pay | Admitting: Family Medicine

## 2021-04-13 ENCOUNTER — Other Ambulatory Visit: Payer: Self-pay | Admitting: Family Medicine

## 2021-04-19 ENCOUNTER — Encounter: Payer: Self-pay | Admitting: Family Medicine

## 2021-04-19 ENCOUNTER — Ambulatory Visit (INDEPENDENT_AMBULATORY_CARE_PROVIDER_SITE_OTHER): Payer: Medicare Other | Admitting: Family Medicine

## 2021-04-19 VITALS — BP 129/68 | HR 57 | Temp 97.2°F | Ht 72.0 in | Wt 203.0 lb

## 2021-04-19 DIAGNOSIS — F339 Major depressive disorder, recurrent, unspecified: Secondary | ICD-10-CM | POA: Diagnosis not present

## 2021-04-19 DIAGNOSIS — E785 Hyperlipidemia, unspecified: Secondary | ICD-10-CM

## 2021-04-19 DIAGNOSIS — N400 Enlarged prostate without lower urinary tract symptoms: Secondary | ICD-10-CM

## 2021-04-19 DIAGNOSIS — E1169 Type 2 diabetes mellitus with other specified complication: Secondary | ICD-10-CM

## 2021-04-19 DIAGNOSIS — R310 Gross hematuria: Secondary | ICD-10-CM

## 2021-04-19 DIAGNOSIS — I10 Essential (primary) hypertension: Secondary | ICD-10-CM

## 2021-04-19 LAB — URINALYSIS
Bilirubin, UA: NEGATIVE
Leukocytes,UA: NEGATIVE
Nitrite, UA: NEGATIVE
Protein,UA: NEGATIVE
Specific Gravity, UA: 1.015 (ref 1.005–1.030)
Urobilinogen, Ur: 0.2 mg/dL (ref 0.2–1.0)
pH, UA: 5.5 (ref 5.0–7.5)

## 2021-04-19 LAB — BAYER DCA HB A1C WAIVED: HB A1C (BAYER DCA - WAIVED): 6.7 % — ABNORMAL HIGH (ref 4.8–5.6)

## 2021-04-19 MED ORDER — FINASTERIDE 1 MG PO TABS: 1.0000 mg | ORAL_TABLET | Freq: Every day | ORAL | 3 refills | Status: DC

## 2021-04-19 MED ORDER — METFORMIN HCL ER 500 MG PO TB24: 500.0000 mg | ORAL_TABLET | Freq: Four times a day (QID) | ORAL | 3 refills | Status: DC

## 2021-04-19 MED ORDER — LISINOPRIL 5 MG PO TABS: 5.0000 mg | ORAL_TABLET | Freq: Two times a day (BID) | ORAL | 3 refills | Status: DC

## 2021-04-19 MED ORDER — AMLODIPINE BESYLATE 10 MG PO TABS: 10.0000 mg | ORAL_TABLET | Freq: Every day | ORAL | 3 refills | Status: DC

## 2021-04-19 MED ORDER — FLUOXETINE HCL 20 MG PO CAPS: 80.0000 mg | ORAL_CAPSULE | Freq: Every day | ORAL | 3 refills | Status: DC

## 2021-04-19 MED ORDER — GABAPENTIN 100 MG PO CAPS: 300.0000 mg | ORAL_CAPSULE | Freq: Three times a day (TID) | ORAL | 3 refills | Status: DC

## 2021-04-19 MED ORDER — EZETIMIBE 10 MG PO TABS: 10.0000 mg | ORAL_TABLET | Freq: Every day | ORAL | 3 refills | Status: DC

## 2021-04-19 NOTE — Progress Notes (Signed)
? ?Subjective:  ?Patient ID: Melvin Overturf.,  ?male    DOB: 1950-03-24  Age: 71 y.o.  ? ? ?CC: Medical Management of Chronic Issues ? ? ?HPI ?Melvin Gaw. presents for  follow-up of hypertension. Patient has no history of headache chest pain or shortness of breath or recent cough. Patient also denies symptoms of TIA such as numbness weakness lateralizing. Patient denies side effects from medication. States taking it regularly. ? ?Patient also  in for follow-up of elevated cholesterol. Doing well without complaints on current medication. Denies side effects  including myalgia and arthralgia and nausea. Also in today for liver function testing. Currently no chest pain, shortness of breath or other cardiovascular related symptoms noted. ? ?Follow-up of diabetes. Patient does check blood sugar at home. Patient denies symptoms such as excessive hunger or urinary frequency, excessive hunger, nausea ?No significant hypoglycemic spells noted. ?Medications reviewed. Pt reports taking them regularly. Pt. denies complication/adverse reaction today.  ? ?Depression screen [facility] 2/9 04/19/2021 04/19/2021 12/23/2020 10/20/2020 04/01/2020  ?Decreased Interest 1 0 0 0 0  ?Down, Depressed, Hopeless 1 0 0 0 0  ?PHQ - 2 Score 2 0 0 0 0  ?Altered sleeping 1 - - - -  ?Tired, decreased energy 1 - - - -  ?Change in appetite 1 - - - -  ?Feeling bad or failure about yourself  1 - - - -  ?Trouble concentrating 1 - - - -  ?Moving slowly or fidgety/restless 1 - - - -  ?Suicidal thoughts 0 - - - -  ?PHQ-9 Score 8 - - - -  ?Difficult doing work/chores Somewhat difficult - - - -  ? ? ? ? ?History ?Melvin [name] has a past medical history of Acute blood loss anemia, BCC (basal cell carcinoma), face, Degeneration of intervertebral disc, site unspecified, Depressive disorder, not elsewhere classified, Esophageal stricture, Essential hypertension, benign, GERD (gastroesophageal reflux disease), Hiatal hernia, Hyperplasia of prostate, Osteoarthrosis and  allied disorders, Postoperative leak, Sleep apnea, Trauma (06/27/2017), and UTI due to Klebsiella species.  ? ?He has a past surgical history that includes Appendectomy ([id number]); Nasal septum surgery (1989); Left Hip REplaacement (01-1999); Right Hip Replacement (09-2002); Lumbar fusion (05-1998); Ventral hernia repair (02-2000); Spine surgery; Joint replacement; Ankle fracture surgery; Femur fracture surgery; Pelvic fracture surgery; Wrist fracture surgery; and Abdominal surgery.  ? ?His family history includes Arthritis in his mother; Asthma in his mother; Bipolar disorder in his brother; Breast cancer in his sister; Hyperlipidemia in his brother; Hypertension in his brother and father; Kidney failure in his father; Lymphoma in his mother. He was adopted.He reports that he quit smoking about 44 years ago. His smoking use included cigars. He has never used smokeless tobacco. He reports that he does not currently use alcohol. He reports that he does not use drugs. ? ?Current Outpatient Medications on File Prior to Visit  ?Medication Sig Dispense Refill  ? Ascorbic Acid (VITAMIN C) 1000 MG tablet Take 1,000 mg by mouth daily.    ? aspirin 81 MG EC tablet Take 1 tablet (81 mg total) by mouth daily.    ? fluticasone (FLONASE) 50 MCG/ACT nasal spray USE 1 TO 2 SPRAYS IN EACH NOSTRIL DAILY 16 g 3  ? ibuprofen (ADVIL) 800 MG tablet TAKE ONE TABLET 3 TIMES A DAY AS NEEDED. (Patient taking differently: Take 800 mg by mouth 3 (three) times daily.) 90 tablet 4  ? Melatonin 3 MG TABS Take 1 tablet (3 mg total) by mouth at bedtime. (  Patient taking differently: Take 6 mg by mouth at bedtime.) 30 tablet 0  ? metoprolol succinate (TOPROL-XL) 25 MG 24 hr tablet Take 1 tablet by mouth daily.    ? Misc Natural Products (OSTEO BI-FLEX JOINT SHIELD PO) Take 2 tablets by mouth daily.    ? Multiple Vitamin (MULTIVITAMIN WITH MINERALS) TABS tablet Take 1 tablet by mouth daily.    ? Olopatadine HCl 0.2 % SOLN Place 1 drop into both eyes  daily.    ? omega-3 acid ethyl esters (LOVAZA) 1 g capsule TAKE 4 CAPSULES DAILY 360 capsule 1  ? pantoprazole (PROTONIX) 40 MG tablet TAKE ONE (1) TABLET EACH DAY 30 tablet 2  ? PAZEO 0.7 % SOLN Place 1 drop into both ears daily.    ? protein supplement shake (PREMIER PROTEIN) LIQD Take 325 mLs (11 oz total) by mouth 3 (three) times daily with meals. (Patient taking differently: Take 11 oz by mouth 2 (two) times daily as needed (for supplement).)  0  ? sildenafil (VIAGRA) 100 MG tablet TAKE 1 TABLET BY MOUTH ONCE DAILY FOR ERECTILE DYSFUNCTION (Patient taking differently: Take 100 mg by mouth as needed for erectile dysfunction.) 10 tablet 3  ? ?No current facility-administered medications on file prior to visit.  ? ? ?ROS ?Review of Systems  ?Constitutional:  Negative for fever.  ?Respiratory:  Negative for shortness of breath.   ?Cardiovascular:  Negative for chest pain.  ?Musculoskeletal:  Negative for arthralgias.  ?     Feet stay tender ?  ?Skin:  Negative for rash.  ? ?Objective:  ?BP 129/68   Pulse (!) 57   Temp (!) 97.2 ?F (36.2 ?C)   Ht 6' (1.829 m)   Wt 203 lb (92.1 kg)   SpO2 94%   BMI 27.53 kg/m?  ? ?BP Readings from Last 3 Encounters:  ?04/19/21 129/68  ?01/03/21 (!) 182/87  ?12/31/20 (!) 135/58  ? ? ?Wt Readings from Last 3 Encounters:  ?04/19/21 203 lb (92.1 kg)  ?01/03/21 203 lb (92.1 kg)  ?12/31/20 203 lb (92.1 kg)  ? ? ? ?Physical Exam ?Vitals reviewed.  ?Constitutional:   ?   Appearance: He is well-developed.  ?HENT:  ?   Head: Normocephalic and atraumatic.  ?   Right Ear: External ear normal.  ?   Left Ear: External ear normal.  ?   Mouth/Throat:  ?   Pharynx: No oropharyngeal exudate or posterior oropharyngeal erythema.  ?Eyes:  ?   Pupils: Pupils are equal, round, and reactive to light.  ?Cardiovascular:  ?   Rate and Rhythm: Normal rate and regular rhythm.  ?   Heart sounds: No murmur heard. ?Pulmonary:  ?   Effort: No respiratory distress.  ?   Breath sounds: Normal breath sounds.   ?Musculoskeletal:  ?   Cervical back: Normal range of motion and neck supple.  ?Neurological:  ?   Mental Status: He is alert and oriented to person, place, and time.  ? ? ?Diabetic Foot Exam - Simple   ?Simple Foot Form ?Diabetic Foot exam was performed with the following findings: Yes 04/19/2021 10:56 AM  ?Visual Inspection ?See comments: Yes ?Sensation Testing ?Intact to touch and monofilament testing bilaterally: Yes ?Pulse Check ?Posterior Tibialis and Dorsalis pulse intact bilaterally: Yes ?Comments ?Hammer toes bilateral 2-4 toes. Corn at left 5th toe ?  ? ? ? ? ?Assessment & Plan:  ? ?Melvin [name] was seen today for medical management of chronic issues. ? ?Diagnoses and all orders for this visit: ? ?  Benign prostatic hyperplasia without lower urinary tract symptoms ?-     PSA, total and free ? ?Primary hypertension ?-     amLODipine (NORVASC) 10 MG tablet; Take 1 tablet (10 mg total) by mouth daily. ?-     TSH ?-     Bayer DCA Hb A1c Waived ?-     CBC with Differential/Platelet ?-     Lipid panel ? ?Hyperlipidemia associated with type 2 diabetes mellitus (HCC) ?-     ezetimibe (ZETIA) 10 MG tablet; Take 1 tablet (10 mg total) by mouth daily. ?-     TSH ?-     Bayer DCA Hb A1c Waived ?-     CBC with Differential/Platelet ?-     Lipid panel ? ?Depression, recurrent (HCC) ?-     FLUoxetine (PROZAC) 20 MG capsule; Take 4 capsules (80 mg total) by mouth daily. ? ?Gross hematuria ?-     Urinalysis ? ?Other orders ?-     finasteride (PROPECIA) 1 MG tablet; Take 1 tablet (1 mg total) by mouth daily. TAKE ONE (1) TABLET EACH DAY ?Strength: 1 mg ?-     gabapentin (NEURONTIN) 100 MG capsule; Take 3 capsules (300 mg total) by mouth 3 (three) times daily. TAKE 3 CAPSULES THREE TIMES A DAY ?Strength: 100 mg ?-     lisinopril (ZESTRIL) 5 MG tablet; Take 1 tablet (5 mg total) by mouth 2 (two) times daily. ?-     metFORMIN (GLUCOPHAGE-XR) 500 MG 24 hr tablet; Take 1 tablet (500 mg total) by mouth in the morning, at noon, in the  evening, and at bedtime. ? ? ?I have discontinued Melvin [name]. Melvin Jr.'s amoxicillin-clavulanate. I have also changed his ezetimibe, finasteride, gabapentin, and metFORMIN. Additionally, I am having him maintain his Olopatadi

## 2021-04-20 LAB — CBC WITH DIFFERENTIAL/PLATELET
Basophils Absolute: 0 10*3/uL (ref 0.0–0.2)
Basos: 0 %
EOS (ABSOLUTE): 0.6 10*3/uL — ABNORMAL HIGH (ref 0.0–0.4)
Eos: 8 %
Hematocrit: 49.7 % (ref 37.5–51.0)
Hemoglobin: 16.6 g/dL (ref 13.0–17.7)
Immature Grans (Abs): 0 10*3/uL (ref 0.0–0.1)
Immature Granulocytes: 0 %
Lymphocytes Absolute: 2 10*3/uL (ref 0.7–3.1)
Lymphs: 27 %
MCH: 29.3 pg (ref 26.6–33.0)
MCHC: 33.4 g/dL (ref 31.5–35.7)
MCV: 88 fL (ref 79–97)
Monocytes Absolute: 0.9 10*3/uL (ref 0.1–0.9)
Monocytes: 12 %
Neutrophils Absolute: 3.9 10*3/uL (ref 1.4–7.0)
Neutrophils: 53 %
Platelets: 163 10*3/uL (ref 150–450)
RBC: 5.67 x10E6/uL (ref 4.14–5.80)
RDW: 13.2 % (ref 11.6–15.4)
WBC: 7.5 10*3/uL (ref 3.4–10.8)

## 2021-04-20 LAB — LIPID PANEL
Chol/HDL Ratio: 4.9 ratio (ref 0.0–5.0)
Cholesterol, Total: 158 mg/dL (ref 100–199)
HDL: 32 mg/dL — ABNORMAL LOW (ref >39)
LDL Chol Calc (NIH): 102 mg/dL — ABNORMAL HIGH (ref 0–99)
Triglycerides: 132 mg/dL (ref 0–149)
VLDL Cholesterol Cal: 24 mg/dL (ref 5–40)

## 2021-04-20 LAB — PSA, TOTAL AND FREE
PSA, Free Pct: 16.2 %
PSA, Free: 0.21 ng/mL
Prostate Specific Ag, Serum: 1.3 ng/mL (ref 0.0–4.0)

## 2021-04-20 LAB — TSH: TSH: 2.46 u[IU]/mL (ref 0.450–4.500)

## 2021-04-20 NOTE — Progress Notes (Signed)
Hello Lyndall,  Your lab result is normal and/or stable.Some minor variations that are not significant are commonly marked abnormal, but do not represent any medical problem for you.  Best regards, Michel Eskelson, M.D.

## 2021-04-22 ENCOUNTER — Other Ambulatory Visit: Payer: Self-pay | Admitting: Family Medicine

## 2021-04-22 DIAGNOSIS — K222 Esophageal obstruction: Secondary | ICD-10-CM

## 2021-04-22 DIAGNOSIS — K2100 Gastro-esophageal reflux disease with esophagitis, without bleeding: Secondary | ICD-10-CM

## 2021-05-14 ENCOUNTER — Emergency Department (HOSPITAL_COMMUNITY)
Admission: EM | Admit: 2021-05-14 | Discharge: 2021-05-14 | Disposition: A | Discharge status: Home / Self Care | Payer: Medicare Other | Attending: Emergency Medicine | Admitting: Emergency Medicine

## 2021-05-14 ENCOUNTER — Encounter (HOSPITAL_COMMUNITY): Payer: Self-pay

## 2021-05-14 ENCOUNTER — Other Ambulatory Visit: Payer: Self-pay

## 2021-05-14 DIAGNOSIS — S0990XA Unspecified injury of head, initial encounter: Secondary | ICD-10-CM | POA: Diagnosis present

## 2021-05-14 DIAGNOSIS — Y92009 Unspecified place in unspecified non-institutional (private) residence as the place of occurrence of the external cause: Secondary | ICD-10-CM | POA: Insufficient documentation

## 2021-05-14 DIAGNOSIS — W228XXA Striking against or struck by other objects, initial encounter: Secondary | ICD-10-CM | POA: Diagnosis not present

## 2021-05-14 DIAGNOSIS — I10 Essential (primary) hypertension: Secondary | ICD-10-CM | POA: Diagnosis not present

## 2021-05-14 DIAGNOSIS — S0091XA Abrasion of unspecified part of head, initial encounter: Secondary | ICD-10-CM | POA: Insufficient documentation

## 2021-05-14 DIAGNOSIS — Z7982 Long term (current) use of aspirin: Secondary | ICD-10-CM | POA: Diagnosis not present

## 2021-05-14 DIAGNOSIS — Z79899 Other long term (current) drug therapy: Secondary | ICD-10-CM | POA: Insufficient documentation

## 2021-05-14 NOTE — ED Triage Notes (Signed)
Patient brought in via POV from home. Patient hit head on truck door. Bleeding controlled. Denies blood thinners but takes multiple doses of ibuprofen throughout the day. A&Ox4 denies vision changes ?

## 2021-05-14 NOTE — Discharge Instructions (Signed)
Clean laceration once or twice a day and apply new dressing.  Follow-up if needed ?

## 2021-05-14 NOTE — ED Provider Notes (Signed)
?[facility] ?Provider Note ? ? ?CSN: [id number] ?Arrival date & time: 05/14/21  1614 ? ?  ? ?History ? ?Chief Complaint  ?Patient presents with  ? Head Injury  ? ? ?Melvin [name]. is a 71 y.o. male. ? ?Patient hit his head on a car door.  No loss conscious past medical history of hypertension ? ?The history is provided by the patient and medical records. No language interpreter was used.  ?Head Injury ?Head/neck injury location: Top top of head. ?Mechanism of injury comment:  Excellently hit head ?Pain details:  ?  Quality:  Aching ?  Radiates to: No radiation. ?  Severity:  Mild ?  Timing:  Constant ?Chronicity:  New ?Relieved by:  Nothing ?Worsened by:  Nothing ?Associated symptoms: no headaches and no seizures   ? ?  ? ?Home Medications ?Prior to Admission medications   ?Medication Sig Start Date End Date Taking? Authorizing Provider  ?amLODipine (NORVASC) 10 MG tablet Take 1 tablet (10 mg total) by mouth daily. 04/19/21   Claretta Fraise, MD  ?Ascorbic Acid (VITAMIN C) 1000 MG tablet Take 1,000 mg by mouth daily.    [provider]  ?aspirin 81 MG EC tablet Take 1 tablet (81 mg total) by mouth daily. 07/10/17   Love, Ivan Anchors, PA-C  ?ezetimibe (ZETIA) 10 MG tablet Take 1 tablet (10 mg total) by mouth daily. 04/19/21   Claretta Fraise, MD  ?finasteride (PROPECIA) 1 MG tablet Take 1 tablet (1 mg total) by mouth daily. TAKE ONE (1) TABLET EACH DAY ?Strength: 1 mg 04/19/21   Claretta Fraise, MD  ?FLUoxetine (PROZAC) 20 MG capsule Take 4 capsules (80 mg total) by mouth daily. 04/19/21   Claretta Fraise, MD  ?fluticasone (FLONASE) 50 MCG/ACT nasal spray USE 1 TO 2 SPRAYS IN EACH NOSTRIL DAILY 08/10/20   Claretta Fraise, MD  ?gabapentin (NEURONTIN) 100 MG capsule Take 3 capsules (300 mg total) by mouth 3 (three) times daily. TAKE 3 CAPSULES THREE TIMES A DAY ?Strength: 100 mg 04/19/21   Claretta Fraise, MD  ?ibuprofen (ADVIL) 800 MG tablet TAKE ONE TABLET 3 TIMES A DAY AS NEEDED. ?Patient taking  differently: Take 800 mg by mouth 3 (three) times daily. 12/06/20   Claretta Fraise, MD  ?lisinopril (ZESTRIL) 5 MG tablet Take 1 tablet (5 mg total) by mouth 2 (two) times daily. 04/19/21   Claretta Fraise, MD  ?Melatonin 3 MG TABS Take 1 tablet (3 mg total) by mouth at bedtime. ?Patient taking differently: Take 6 mg by mouth at bedtime. 07/10/17   Love, Ivan Anchors, PA-C  ?metFORMIN (GLUCOPHAGE-XR) 500 MG 24 hr tablet Take 1 tablet (500 mg total) by mouth in the morning, at noon, in the evening, and at bedtime. 04/19/21   Claretta Fraise, MD  ?metoprolol succinate (TOPROL-XL) 25 MG 24 hr tablet Take 1 tablet by mouth daily. 12/28/19   [provider]  ?Misc Natural Products (OSTEO BI-FLEX JOINT SHIELD PO) Take 2 tablets by mouth daily.    [provider]  ?Multiple Vitamin (MULTIVITAMIN WITH MINERALS) TABS tablet Take 1 tablet by mouth daily. 07/10/17   Bary Leriche, PA-C  ?Olopatadine HCl 0.2 % SOLN Place 1 drop into both eyes daily.    [provider]  ?omega-3 acid ethyl esters (LOVAZA) 1 g capsule TAKE 4 CAPSULES DAILY 04/14/20   Claretta Fraise, MD  ?pantoprazole (PROTONIX) 40 MG tablet TAKE ONE (1) TABLET EACH DAY 04/22/21   Claretta Fraise, MD  ?PAZEO 0.7 % SOLN  Place 1 drop into both ears daily. 09/25/18   [provider]  ?protein supplement shake (PREMIER PROTEIN) LIQD Take 325 mLs (11 oz total) by mouth 3 (three) times daily with meals. ?Patient taking differently: Take 11 oz by mouth 2 (two) times daily as needed (for supplement). 07/10/17   Love, Ivan Anchors, PA-C  ?sildenafil (VIAGRA) 100 MG tablet TAKE 1 TABLET BY MOUTH ONCE DAILY FOR ERECTILE DYSFUNCTION ?Patient taking differently: Take 100 mg by mouth as needed for erectile dysfunction. 11/22/20   Claretta Fraise, MD  ?   ? ?Allergies    ?Ativan [lorazepam], Dilaudid [hydromorphone], Morphine, Seroquel [quetiapine], and Statins   ? ?Review of Systems   ?Review of Systems  ?Constitutional:  Negative for appetite change and fatigue.   ?HENT:  Negative for congestion, ear discharge and sinus pressure.   ?     Abrasion of the head  ?Eyes:  Negative for discharge.  ?Respiratory:  Negative for cough.   ?Cardiovascular:  Negative for chest pain.  ?Gastrointestinal:  Negative for abdominal pain and diarrhea.  ?Genitourinary:  Negative for frequency and hematuria.  ?Musculoskeletal:  Negative for back pain.  ?Skin:  Negative for rash.  ?Neurological:  Negative for seizures and headaches.  ?Psychiatric/Behavioral:  Negative for hallucinations.   ? ?Physical Exam ?Updated Vital Signs ?Ht 6' (1.829 m)   Wt 92.1 kg   BMI 27.53 kg/m?  ?Physical Exam ?Vitals and nursing note reviewed.  ?Constitutional:   ?   Appearance: He is well-developed.  ?HENT:  ?   Head: Normocephalic.  ?   Comments: Abrasion to top of head ?Eyes:  ?   General: No scleral icterus. ?   Conjunctiva/sclera: Conjunctivae normal.  ?Neck:  ?   Thyroid: No thyromegaly.  ?Cardiovascular:  ?   Rate and Rhythm: Normal rate and regular rhythm.  ?   Heart sounds: No murmur heard. ?  No friction rub. No gallop.  ?Pulmonary:  ?   Breath sounds: No stridor. No wheezing or rales.  ?Chest:  ?   Chest wall: No tenderness.  ?Abdominal:  ?   General: There is no distension.  ?   Tenderness: There is no abdominal tenderness. There is no rebound.  ?Musculoskeletal:     ?   General: Normal range of motion.  ?   Cervical back: Neck supple.  ?Lymphadenopathy:  ?   Cervical: No cervical adenopathy.  ?Skin: ?   Findings: No erythema or rash.  ?Neurological:  ?   Mental Status: He is alert and oriented to person, place, and time.  ?   Motor: No abnormal muscle tone.  ?   Coordination: Coordination normal.  ?Psychiatric:     ?   Behavior: Behavior normal.  ? ? ?ED Results / Procedures / Treatments   ?Labs ?(all labs ordered are listed, but only abnormal results are displayed) ?Labs Reviewed - No data to display ? ?EKG ?None ? ?Radiology ?No results found. ? ?Procedures ?Procedures  ? ? ?Medications Ordered in  ED ?Medications - No data to display ? ?ED Course/ Medical Decision Making/ A&P ?  ?                        ?Medical Decision Making ?This patient presents to the ED for concern of head injury, this involves an extensive number of treatment options, and is a complaint that carries with it a high risk of complications and morbidity.  The differential diagnosis includes contusion to head,  abrasion to head, concussion ? ? ?Co morbidities that complicate the patient evaluation ? ?Hypertension ? ? ?Additional history obtained: ? ?Additional history obtained from significant other ?External records from outside source obtained and reviewed including hospital record ? ? ?Lab Tests: ? ?No labs ordered ?Imaging Studies ordered: ?No x-rays ordered ?Cardiac Monitoring: / EKG: ? ?The patient was maintained on a cardiac monitor.  I personally viewed and interpreted the cardiac monitored which showed an underlying rhythm of: Normal sinus rhythm ? ? ?Consultations Obtained: ?No consult ? ?Problem List / ED Course / Critical interventions / Medication management ? ?Contusion to head with abrasion ?No medicines ordered ?Reevaluation of the patient after these medicines showed that the patient improved ?I have reviewed the patients home medicines and have made adjustments as needed ? ? ?Social Determinants of Health: ? ?None ? ? ?Test / Admission - Considered: ? ?None ? ?Patient with abrasion to top of head.  He is on aspirin and it is bleeding a little.  We cleaned it and applied a dressing and he will follow-up as needed ? ? ? ? ? ? ? ?Final Clinical Impression(s) / ED Diagnoses ?Final diagnoses:  ?Injury of head, initial encounter  ? ? ?Rx / DC Orders ?ED Discharge Orders   ? ? None  ? ?  ? ? ?  ?Milton Ferguson, MD ?05/15/21 1112 ? ?

## 2021-06-24 ENCOUNTER — Other Ambulatory Visit: Payer: Self-pay | Admitting: Family Medicine

## 2021-06-24 DIAGNOSIS — K2100 Gastro-esophageal reflux disease with esophagitis, without bleeding: Secondary | ICD-10-CM

## 2021-06-24 DIAGNOSIS — K222 Esophageal obstruction: Secondary | ICD-10-CM

## 2021-06-30 ENCOUNTER — Other Ambulatory Visit: Payer: Self-pay | Admitting: Family Medicine

## 2021-10-12 ENCOUNTER — Emergency Department (HOSPITAL_COMMUNITY)
Admission: EM | Admit: 2021-10-12 | Discharge: 2021-10-12 | Disposition: A | Discharge status: Home / Self Care | Payer: Medicare Other | Attending: Emergency Medicine | Admitting: Emergency Medicine

## 2021-10-12 ENCOUNTER — Other Ambulatory Visit: Payer: Self-pay

## 2021-10-12 ENCOUNTER — Emergency Department (HOSPITAL_COMMUNITY): Payer: Medicare Other

## 2021-10-12 ENCOUNTER — Encounter (HOSPITAL_COMMUNITY): Payer: Self-pay

## 2021-10-12 DIAGNOSIS — Z79899 Other long term (current) drug therapy: Secondary | ICD-10-CM | POA: Insufficient documentation

## 2021-10-12 DIAGNOSIS — I10 Essential (primary) hypertension: Secondary | ICD-10-CM | POA: Insufficient documentation

## 2021-10-12 DIAGNOSIS — Y92002 Bathroom of unspecified non-institutional (private) residence single-family (private) house as the place of occurrence of the external cause: Secondary | ICD-10-CM | POA: Diagnosis not present

## 2021-10-12 DIAGNOSIS — W01198A Fall on same level from slipping, tripping and stumbling with subsequent striking against other object, initial encounter: Secondary | ICD-10-CM | POA: Insufficient documentation

## 2021-10-12 DIAGNOSIS — Z7982 Long term (current) use of aspirin: Secondary | ICD-10-CM | POA: Insufficient documentation

## 2021-10-12 DIAGNOSIS — S199XXA Unspecified injury of neck, initial encounter: Secondary | ICD-10-CM | POA: Insufficient documentation

## 2021-10-12 DIAGNOSIS — E119 Type 2 diabetes mellitus without complications: Secondary | ICD-10-CM | POA: Diagnosis not present

## 2021-10-12 DIAGNOSIS — Z7984 Long term (current) use of oral hypoglycemic drugs: Secondary | ICD-10-CM | POA: Insufficient documentation

## 2021-10-12 DIAGNOSIS — S0990XA Unspecified injury of head, initial encounter: Secondary | ICD-10-CM | POA: Insufficient documentation

## 2021-10-12 DIAGNOSIS — W19XXXA Unspecified fall, initial encounter: Secondary | ICD-10-CM

## 2021-10-12 NOTE — ED Provider Notes (Signed)
[facility] EMERGENCY DEPARTMENT Provider Note   CSN: [id number] Arrival date & time: 10/12/21  1129     History  Chief Complaint  Patient presents with   Melvin [name]. is a 71 y.o. male with past medical history significant for hypertension, acid reflux, obstructive sleep apnea, hyperlipidemia, diabetes who presents with concern for mechanical, nonsyncopal fall after slipping in the shower.  Patient reports that he fell back onto his butt and then hit his head on shower seat.  Patient reports that he is having some generalized aching but no significant pain.  He does not take anything for pain prior to arrival.  Patient reports no numbness, tingling, vision changes, confusion, disorientation after the fall but wanted to "be better safe than sorry".  Reports that he is "heard horror stories about falls and head bleeds at this location".  Patient does take a baby aspirin but does not take any other anticoagulation or antiplatelets.   Fall       Home Medications Prior to Admission medications   Medication Sig Start Date End Date Taking? Authorizing Provider  ibuprofen (ADVIL) 800 MG tablet TAKE ONE TABLET 3 TIMES A DAY AS NEEDED. 06/30/21   Claretta Fraise, MD  amLODipine (NORVASC) 10 MG tablet Take 1 tablet (10 mg total) by mouth daily. 04/19/21   Claretta Fraise, MD  Ascorbic Acid (VITAMIN C) 1000 MG tablet Take 1,000 mg by mouth daily.    [provider]  aspirin 81 MG EC tablet Take 1 tablet (81 mg total) by mouth daily. 07/10/17   Love, Ivan Anchors, PA-C  ezetimibe (ZETIA) 10 MG tablet Take 1 tablet (10 mg total) by mouth daily. 04/19/21   Claretta Fraise, MD  finasteride (PROPECIA) 1 MG tablet Take 1 tablet (1 mg total) by mouth daily. TAKE ONE (1) TABLET EACH DAY Strength: 1 mg 04/19/21   Claretta Fraise, MD  FLUoxetine (PROZAC) 20 MG capsule Take 4 capsules (80 mg total) by mouth daily. 04/19/21   Claretta Fraise, MD  fluticasone (FLONASE) 50 MCG/ACT nasal spray USE 1 TO  2 SPRAYS IN EACH NOSTRIL DAILY 08/10/20   Claretta Fraise, MD  gabapentin (NEURONTIN) 100 MG capsule Take 3 capsules (300 mg total) by mouth 3 (three) times daily. TAKE 3 CAPSULES THREE TIMES A DAY Strength: 100 mg 04/19/21   Claretta Fraise, MD  lisinopril (ZESTRIL) 5 MG tablet Take 1 tablet (5 mg total) by mouth 2 (two) times daily. 04/19/21   Claretta Fraise, MD  Melatonin 3 MG TABS Take 1 tablet (3 mg total) by mouth at bedtime. Patient taking differently: Take 6 mg by mouth at bedtime. 07/10/17   Love, Ivan Anchors, PA-C  metFORMIN (GLUCOPHAGE-XR) 500 MG 24 hr tablet Take 1 tablet (500 mg total) by mouth in the morning, at noon, in the evening, and at bedtime. 04/19/21   Claretta Fraise, MD  metoprolol succinate (TOPROL-XL) 25 MG 24 hr tablet Take 1 tablet by mouth daily. 12/28/19   [provider]  Misc Natural Products (OSTEO BI-FLEX JOINT SHIELD PO) Take 2 tablets by mouth daily.    [provider]  Multiple Vitamin (MULTIVITAMIN WITH MINERALS) TABS tablet Take 1 tablet by mouth daily. 07/10/17   Love, Ivan Anchors, PA-C  Olopatadine HCl 0.2 % SOLN Place 1 drop into both eyes daily.    [provider]  omega-3 acid ethyl esters (LOVAZA) 1 g capsule TAKE 4 CAPSULES DAILY 04/14/20   Claretta Fraise, MD  pantoprazole (PROTONIX) 40  MG tablet TAKE ONE (1) TABLET EACH DAY 06/24/21   Claretta Fraise, MD  PAZEO 0.7 % SOLN Place 1 drop into both ears daily. 09/25/18   [provider]  protein supplement shake (PREMIER PROTEIN) LIQD Take 325 mLs (11 oz total) by mouth 3 (three) times daily with meals. Patient taking differently: Take 11 oz by mouth 2 (two) times daily as needed (for supplement). 07/10/17   Love, Ivan Anchors, PA-C  sildenafil (VIAGRA) 100 MG tablet TAKE 1 TABLET BY MOUTH ONCE DAILY FOR ERECTILE DYSFUNCTION Patient taking differently: Take 100 mg by mouth as needed for erectile dysfunction. 11/22/20   Claretta Fraise, MD      Allergies    Ativan [lorazepam], Dilaudid  [hydromorphone], Morphine, Seroquel [quetiapine], and Statins    Review of Systems   Review of Systems  All other systems reviewed and are negative.   Physical Exam Updated Vital Signs BP (!) 153/85 (BP Location: Right Arm)   Pulse 69   Temp 98.1 F (36.7 C) (Oral)   Resp 18   Ht 6' (1.829 m)   Wt 90.7 kg   SpO2 93%   BMI 27.12 kg/m  Physical Exam Vitals and nursing note reviewed.  Constitutional:      General: He is not in acute distress.    Appearance: Normal appearance.  HENT:     Head: Normocephalic and atraumatic.     Comments: No hemotympanum bilaterally    Right Ear: Tympanic membrane normal.     Left Ear: Tympanic membrane normal.  Eyes:     General:        Right eye: No discharge.        Left eye: No discharge.  Cardiovascular:     Rate and Rhythm: Normal rate and regular rhythm.     Heart sounds: No murmur heard.    No friction rub. No gallop.  Pulmonary:     Effort: Pulmonary effort is normal.     Breath sounds: Normal breath sounds.  Abdominal:     General: Bowel sounds are normal.     Palpations: Abdomen is soft.  Musculoskeletal:     Comments: No significant tenderness midline cervical spine, or cervical paraspinous muscles.  Intact range of motion to flexion, extension, lateral rotation of the cervical spine.  No step-off or deformity noted at base of skull.  Skin:    General: Skin is warm and dry.     Capillary Refill: Capillary refill takes less than 2 seconds.  Neurological:     Mental Status: He is alert and oriented to person, place, and time.     Comments: Cranial nerves II through XII grossly intact.  Intact finger-nose, intact heel-to-shin.  Romberg negative, gait normal.  Alert and oriented x3.  Moves all 4 limbs spontaneously, normal coordination.  No pronator drift.  Intact strength 5 out of 5 bilateral upper and lower extremities.    Psychiatric:        Mood and Affect: Mood normal.        Behavior: Behavior normal.     ED Results  / Procedures / Treatments   Labs (all labs ordered are listed, but only abnormal results are displayed) Labs Reviewed - No data to display  EKG None  Radiology CT Head Wo Contrast  Result Date: 10/12/2021 CLINICAL DATA:  Fall EXAM: CT HEAD WITHOUT CONTRAST CT CERVICAL SPINE WITHOUT CONTRAST TECHNIQUE: Multidetector CT imaging of the head and cervical spine was performed following the standard protocol without intravenous contrast. Multiplanar  CT image reconstructions of the cervical spine were also generated. RADIATION DOSE REDUCTION: This exam was performed according to the departmental dose-optimization program which includes automated exposure control, adjustment of the mA and/or kV according to patient size and/or use of iterative reconstruction technique. COMPARISON:  None Available. FINDINGS: CT HEAD FINDINGS Brain: No evidence of acute infarction, hemorrhage, hydrocephalus, extra-axial collection or mass lesion/mass effect. Vascular: No hyperdense vessel or unexpected calcification. Skull: Normal. Negative for fracture or focal lesion. Sinuses/Orbits: No acute finding. Other: None. CT CERVICAL SPINE FINDINGS Alignment: Degenerative straightening and reversal of the normal cervical lordosis. Skull base and vertebrae: No acute fracture. No primary bone lesion or focal pathologic process. Soft tissues and spinal canal: No prevertebral fluid or swelling. No visible canal hematoma. Disc levels: Moderate disc space height loss and osteophytosis throughout the mid to lower cervical spine, from C3 through C7. Upper chest: Negative. Other: None. IMPRESSION: 1. No acute intracranial pathology. 2. No fracture or static subluxation of the cervical spine. 3. Moderate multilevel cervical disc degenerative disease. Electronically Signed   By: Delanna Ahmadi M.D.   On: 10/12/2021 13:25   CT Cervical Spine Wo Contrast  Result Date: 10/12/2021 CLINICAL DATA:  Fall EXAM: CT HEAD WITHOUT CONTRAST CT CERVICAL SPINE  WITHOUT CONTRAST TECHNIQUE: Multidetector CT imaging of the head and cervical spine was performed following the standard protocol without intravenous contrast. Multiplanar CT image reconstructions of the cervical spine were also generated. RADIATION DOSE REDUCTION: This exam was performed according to the departmental dose-optimization program which includes automated exposure control, adjustment of the mA and/or kV according to patient size and/or use of iterative reconstruction technique. COMPARISON:  None Available. FINDINGS: CT HEAD FINDINGS Brain: No evidence of acute infarction, hemorrhage, hydrocephalus, extra-axial collection or mass lesion/mass effect. Vascular: No hyperdense vessel or unexpected calcification. Skull: Normal. Negative for fracture or focal lesion. Sinuses/Orbits: No acute finding. Other: None. CT CERVICAL SPINE FINDINGS Alignment: Degenerative straightening and reversal of the normal cervical lordosis. Skull base and vertebrae: No acute fracture. No primary bone lesion or focal pathologic process. Soft tissues and spinal canal: No prevertebral fluid or swelling. No visible canal hematoma. Disc levels: Moderate disc space height loss and osteophytosis throughout the mid to lower cervical spine, from C3 through C7. Upper chest: Negative. Other: None. IMPRESSION: 1. No acute intracranial pathology. 2. No fracture or static subluxation of the cervical spine. 3. Moderate multilevel cervical disc degenerative disease. Electronically Signed   By: Delanna Ahmadi M.D.   On: 10/12/2021 13:25    Procedures Procedures    Medications Ordered in ED Medications - No data to display  ED Course/ Medical Decision Making/ A&P                           Medical Decision Making Amount and/or Complexity of Data Reviewed Radiology: ordered.   With a past medical history significant for hypertension, acid reflux who is not currently taking any anticoagulation, or antiplatelet therapy who presents  with concern for injury to the back of the head, neck after slipping and falling in the shower.  Patient fell first onto his buttocks and then onto the back of the head.  On physical exam patient without any step-offs or deformity, no significant tenderness to back of the head back of the neck, or other areas of the body.  He is neurologically intact with no focal deficits, and is ambulatory without difficulty, negative Romberg, normal-appearing gait.  He is in  very minimal pain that he rates 1/10.  Given patient's advanced age I think it be reasonable to obtain a noncontrast CT head and neck to ensure no occult fractures.  I independently interpreted imaging including CT head without contrast, CT cervical spine without contrast which shows no acute intracranial abnormality, no fracture, dislocation, or other unstable injury of the cervical spine. I agree with the radiologist interpretation.  Discussed with patient that he may have worsening pain, tightness from cervical strain or sprain, later today and into the well but otherwise appears well today, no significant noted injuries, no LOC, no signs of concussion, including photophobia, phonophobia, NV, confusion.  Encouraged ibuprofen, Tylenol, ice, rest.  Extensive return precautions given, patient discharged in stable condition at this time.  Final Clinical Impression(s) / ED Diagnoses Final diagnoses:  Fall, initial encounter    Rx / DC Orders ED Discharge Orders     None         Dorien Chihuahua 10/12/21 1341    Godfrey Pick, MD 10/12/21 1710

## 2021-10-12 NOTE — ED Notes (Signed)
Patient transported to CT 

## 2021-10-12 NOTE — ED Notes (Signed)
Pt is alert and oriented.  When asked to clarify what blood thinners he takes he states "baby aspirin and ibuprofen".  Pt is alert and oriented, no LOC with fall

## 2021-10-12 NOTE — ED Triage Notes (Signed)
Pt to er, pt states that he is here because he slipped in the shower and fell back and hit his head on a shower seat.  Pt states that he is on blood thinners and bruises easily so he wanted to get checked out.  Pt oriented times three.

## 2021-10-12 NOTE — Discharge Instructions (Signed)
Please use Tylenol or ibuprofen for pain.  You may use 600 mg ibuprofen every 6 hours or 1000 mg of Tylenol every 6 hours.  You may choose to alternate between the 2.  This would be most effective.  Not to exceed 4 g of Tylenol within 24 hours.  Not to exceed 3200 mg ibuprofen 24 hours.  

## 2021-10-20 ENCOUNTER — Encounter: Payer: Self-pay | Admitting: Family Medicine

## 2021-10-20 ENCOUNTER — Ambulatory Visit (INDEPENDENT_AMBULATORY_CARE_PROVIDER_SITE_OTHER): Payer: Medicare Other | Admitting: Family Medicine

## 2021-10-20 VITALS — BP 136/74 | HR 74 | Temp 97.3°F | Ht 72.0 in | Wt 199.2 lb

## 2021-10-20 DIAGNOSIS — E1159 Type 2 diabetes mellitus with other circulatory complications: Secondary | ICD-10-CM

## 2021-10-20 DIAGNOSIS — Z23 Encounter for immunization: Secondary | ICD-10-CM | POA: Diagnosis not present

## 2021-10-20 DIAGNOSIS — R5383 Other fatigue: Secondary | ICD-10-CM | POA: Diagnosis not present

## 2021-10-20 DIAGNOSIS — E1169 Type 2 diabetes mellitus with other specified complication: Secondary | ICD-10-CM

## 2021-10-20 DIAGNOSIS — I152 Hypertension secondary to endocrine disorders: Secondary | ICD-10-CM

## 2021-10-20 DIAGNOSIS — E785 Hyperlipidemia, unspecified: Secondary | ICD-10-CM
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
## 2021-10-24 LAB — CBC WITH DIFFERENTIAL/PLATELET
Basophils Absolute: 0.1 10*3/uL (ref 0.0–0.2)
Basos: 1 %
EOS (ABSOLUTE): 0.3 10*3/uL (ref 0.0–0.4)
Eos: 6 %
Hematocrit: 45.1 % (ref 37.5–51.0)
Hemoglobin: 15.3 g/dL (ref 13.0–17.7)
Immature Grans (Abs): 0 10*3/uL (ref 0.0–0.1)
Immature Granulocytes: 0 %
Lymphocytes Absolute: 1.6 10*3/uL (ref 0.7–3.1)
Lymphs: 27 %
MCH: 31 pg (ref 26.6–33.0)
MCHC: 33.9 g/dL (ref 31.5–35.7)
MCV: 91 fL (ref 79–97)
Monocytes Absolute: 0.5 10*3/uL (ref 0.1–0.9)
Monocytes: 8 %
Neutrophils Absolute: 3.4 10*3/uL (ref 1.4–7.0)
Neutrophils: 58 %
Platelets: 184 10*3/uL (ref 150–450)
RBC: 4.94 x10E6/uL (ref 4.14–5.80)
RDW: 13.1 % (ref 11.6–15.4)
WBC: 5.8 10*3/uL (ref 3.4–10.8)

## 2021-10-24 LAB — CMP14+EGFR
ALT: 21 IU/L (ref 0–44)
AST: 18 IU/L (ref 0–40)
Albumin/Globulin Ratio: 1.9 (ref 1.2–2.2)
Albumin: 4.2 g/dL (ref 3.8–4.8)
Alkaline Phosphatase: 56 IU/L (ref 44–121)
BUN/Creatinine Ratio: 32 — ABNORMAL HIGH (ref 10–24)
BUN: 23 mg/dL (ref 8–27)
Bilirubin Total: 0.4 mg/dL (ref 0.0–1.2)
CO2: 21 mmol/L (ref 20–29)
Calcium: 9.3 mg/dL (ref 8.6–10.2)
Chloride: 102 mmol/L (ref 96–106)
Creatinine, Ser: 0.72 mg/dL — ABNORMAL LOW (ref 0.76–1.27)
Globulin, Total: 2.2 g/dL (ref 1.5–4.5)
Glucose: 166 mg/dL — ABNORMAL HIGH (ref 70–99)
Potassium: 4.3 mmol/L (ref 3.5–5.2)
Sodium: 139 mmol/L (ref 134–144)
Total Protein: 6.4 g/dL (ref 6.0–8.5)
eGFR: 97 mL/min/{1.73_m2} (ref >59)

## 2021-10-24 LAB — TESTOSTERONE,FREE AND TOTAL
Testosterone, Free: 35.9 pg/mL — ABNORMAL HIGH (ref 6.6–18.1)
Testosterone: 356 ng/dL (ref 264–916)

## 2021-10-24 LAB — LIPID PANEL
Chol/HDL Ratio: 3.7 ratio (ref 0.0–5.0)
Cholesterol, Total: 154 mg/dL (ref 100–199)
HDL: 42 mg/dL (ref >39)
LDL Chol Calc (NIH): 95 mg/dL (ref 0–99)
Triglycerides: 90 mg/dL (ref 0–149)
VLDL Cholesterol Cal: 17 mg/dL (ref 5–40)

## 2021-10-24 LAB — TSH: TSH: 1.61 u[IU]/mL (ref 0.450–4.500)

## 2021-10-24 LAB — MAGNESIUM: Magnesium: 1.8 mg/dL (ref 1.6–2.3)

## 2021-10-24 NOTE — Progress Notes (Signed)
Hello Sandra,  Your lab result is normal and/or stable.Some minor variations that are not significant are commonly marked abnormal, but do not represent any medical problem for you.  Best regards, Claretta Fraise, M.D.

## 2021-11-13 ENCOUNTER — Other Ambulatory Visit: Payer: Self-pay | Admitting: Family Medicine

## 2021-11-13 DIAGNOSIS — K2100 Gastro-esophageal reflux disease with esophagitis, without bleeding: Secondary | ICD-10-CM

## 2021-11-13 DIAGNOSIS — K222 Esophageal obstruction: Secondary | ICD-10-CM

## 2021-12-01 ENCOUNTER — Encounter: Payer: Self-pay | Admitting: Internal Medicine
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
## 2021-12-02 ENCOUNTER — Ambulatory Visit: Payer: Medicare Other | Admitting: Internal Medicine

## 2021-12-26 ENCOUNTER — Ambulatory Visit (INDEPENDENT_AMBULATORY_CARE_PROVIDER_SITE_OTHER): Payer: Medicare Other

## 2021-12-26 VITALS — Ht 72.0 in | Wt 200.0 lb

## 2021-12-26 DIAGNOSIS — Z0000 Encounter for general adult medical examination without abnormal findings: Secondary | ICD-10-CM | POA: Diagnosis not present

## 2021-12-26 DIAGNOSIS — Z1211 Encounter for screening for malignant neoplasm of colon: Secondary | ICD-10-CM

## 2021-12-26 NOTE — Patient Instructions (Signed)
Melvin [name] , Thank you for taking time to come for your Medicare Wellness Visit. I appreciate your ongoing commitment to your health goals. Please review the following plan we discussed and let me know if I can assist you in the future.   These are the goals we discussed:  Goals      Patient Stated     Stay healthy and active        This is a list of the screening recommended for you and due dates:  Health Maintenance  Topic Date Due   Zoster (Shingles) Vaccine (1 of 2) Never done   Eye exam for diabetics  02/06/2009   Colon Cancer Screening  06/09/2017   COVID-19 Vaccine (4 - Moderna risk series) 02/11/2020   Hemoglobin A1C  10/20/2021   Pneumonia Vaccine (2 - PPSV23 or PCV20) 04/20/2022*   Complete foot exam   04/20/2022   Yearly kidney health urinalysis for diabetes  08/27/2022   Yearly kidney function blood test for diabetes  10/21/2022   Medicare Annual Wellness Visit  12/27/2022   Flu Shot  Completed   Hepatitis C Screening: USPSTF Recommendation to screen - Ages 18-79 yo.  Completed   HPV Vaccine  Aged Out  *Topic was postponed. The date shown is not the original due date.    Advanced directives: Please bring a copy of your health care power of attorney and living will to the office to be added to your chart at your convenience.   Conditions/risks identified: Aim for 30 minutes of exercise or brisk walking, 6-8 glasses of water, and 5 servings of fruits and vegetables each day.   Next appointment: Follow up in one year for your annual wellness visit.   Preventive Care 32 Years and Older, Male  Preventive care refers to lifestyle choices and visits with your health care provider that can promote health and wellness. What does preventive care include? A yearly physical exam. This is also called an annual well check. Dental exams once or twice a year. Routine eye exams. Ask your health care provider how often you should have your eyes checked. Personal lifestyle  choices, including: Daily care of your teeth and gums. Regular physical activity. Eating a healthy diet. Avoiding tobacco and drug use. Limiting alcohol use. Practicing safe sex. Taking low doses of aspirin every day. Taking vitamin and mineral supplements as recommended by your health care provider. What happens during an annual well check? The services and screenings done by your health care provider during your annual well check will depend on your age, overall health, lifestyle risk factors, and family history of disease. Counseling  Your health care provider may ask you questions about your: Alcohol use. Tobacco use. Drug use. Emotional well-being. Home and relationship well-being. Sexual activity. Eating habits. History of falls. Memory and ability to understand (cognition). Work and work Statistician. Screening  You may have the following tests or measurements: Height, weight, and BMI. Blood pressure. Lipid and cholesterol levels. These may be checked every 5 years, or more frequently if you are over 78 years old. Skin check. Lung cancer screening. You may have this screening every year starting at age 65 if you have a 30-pack-year history of smoking and currently smoke or have quit within the past 15 years. Fecal occult blood test (FOBT) of the stool. You may have this test every year starting at age 54. Flexible sigmoidoscopy or colonoscopy. You may have a sigmoidoscopy every 5 years or a colonoscopy every 10 years  starting at age 62. Prostate cancer screening. Recommendations will vary depending on your family history and other risks. Hepatitis C blood test. Hepatitis B blood test. Sexually transmitted disease (STD) testing. Diabetes screening. This is done by checking your blood sugar (glucose) after you have not eaten for a while (fasting). You may have this done every 1-3 years. Abdominal aortic aneurysm (AAA) screening. You may need this if you are a current or  former smoker. Osteoporosis. You may be screened starting at age 85 if you are at high risk. Talk with your health care provider about your test results, treatment options, and if necessary, the need for more tests. Vaccines  Your health care provider may recommend certain vaccines, such as: Influenza vaccine. This is recommended every year. Tetanus, diphtheria, and acellular pertussis (Tdap, Td) vaccine. You may need a Td booster every 10 years. Zoster vaccine. You may need this after age 65. Pneumococcal 13-valent conjugate (PCV13) vaccine. One dose is recommended after age 67. Pneumococcal polysaccharide (PPSV23) vaccine. One dose is recommended after age 43. Talk to your health care provider about which screenings and vaccines you need and how often you need them. This information is not intended to replace advice given to you by your health care provider. Make sure you discuss any questions you have with your health care provider. Document Released: 02/19/2015 Document Revised: 10/13/2015 Document Reviewed: 11/24/2014 Elsevier Interactive Patient Education  2017 [employer] Prevention in the Home Falls can cause injuries. They can happen to people of all ages. There are many things you can do to make your home safe and to help prevent falls. What can I do on the outside of my home? Regularly fix the edges of walkways and driveways and fix any cracks. Remove anything that might make you trip as you walk through a door, such as a raised step or threshold. Trim any bushes or trees on the path to your home. Use bright outdoor lighting. Clear any walking paths of anything that might make someone trip, such as rocks or tools. Regularly check to see if handrails are loose or broken. Make sure that both sides of any steps have handrails. Any raised decks and porches should have guardrails on the edges. Have any leaves, snow, or ice cleared regularly. Use sand or salt on walking paths  during winter. Clean up any spills in your garage right away. This includes oil or grease spills. What can I do in the bathroom? Use night lights. Install grab bars by the toilet and in the tub and shower. Do not use towel bars as grab bars. Use non-skid mats or decals in the tub or shower. If you need to sit down in the shower, use a plastic, non-slip stool. Keep the floor dry. Clean up any water that spills on the floor as soon as it happens. Remove soap buildup in the tub or shower regularly. Attach bath mats securely with double-sided non-slip rug tape. Do not have throw rugs and other things on the floor that can make you trip. What can I do in the bedroom? Use night lights. Make sure that you have a light by your bed that is easy to reach. Do not use any sheets or blankets that are too big for your bed. They should not hang down onto the floor. Have a firm chair that has side arms. You can use this for support while you get dressed. Do not have throw rugs and other things on the floor that  can make you trip. What can I do in the kitchen? Clean up any spills right away. Avoid walking on wet floors. Keep items that you use a lot in easy-to-reach places. If you need to reach something above you, use a strong step stool that has a grab bar. Keep electrical cords out of the way. Do not use floor polish or wax that makes floors slippery. If you must use wax, use non-skid floor wax. Do not have throw rugs and other things on the floor that can make you trip. What can I do with my stairs? Do not leave any items on the stairs. Make sure that there are handrails on both sides of the stairs and use them. Fix handrails that are broken or loose. Make sure that handrails are as long as the stairways. Check any carpeting to make sure that it is firmly attached to the stairs. Fix any carpet that is loose or worn. Avoid having throw rugs at the top or bottom of the stairs. If you do have throw  rugs, attach them to the floor with carpet tape. Make sure that you have a light switch at the top of the stairs and the bottom of the stairs. If you do not have them, ask someone to add them for you. What else can I do to help prevent falls? Wear shoes that: Do not have high heels. Have rubber bottoms. Are comfortable and fit you well. Are closed at the toe. Do not wear sandals. If you use a stepladder: Make sure that it is fully opened. Do not climb a closed stepladder. Make sure that both sides of the stepladder are locked into place. Ask someone to hold it for you, if possible. Clearly mark and make sure that you can see: Any grab bars or handrails. First and last steps. Where the edge of each step is. Use tools that help you move around (mobility aids) if they are needed. These include: Canes. Walkers. Scooters. Crutches. Turn on the lights when you go into a dark area. Replace any light bulbs as soon as they burn out. Set up your furniture so you have a clear path. Avoid moving your furniture around. If any of your floors are uneven, fix them. If there are any pets around you, be aware of where they are. Review your medicines with your doctor. Some medicines can make you feel dizzy. This can increase your chance of falling. Ask your doctor what other things that you can do to help prevent falls. This information is not intended to replace advice given to you by your health care provider. Make sure you discuss any questions you have with your health care provider. Document Released: 11/19/2008 Document Revised: 07/01/2015 Document Reviewed: 02/27/2014 Elsevier Interactive Patient Education  2017 [employer].

## 2021-12-26 NOTE — Progress Notes (Signed)
Subjective:   Melvin [name]. is a 71 y.o. male who presents for Medicare Annual/Subsequent preventive examination. I connected with  [name] on 12/26/21 by a audio enabled telemedicine application and verified that I am speaking with the correct person using two identifiers.  Patient Location: Home  Provider Location: Home Office  I discussed the limitations of evaluation and management by telemedicine. The patient expressed understanding and agreed to proceed.  Review of Systems     Cardiac Risk Factors include: advanced age (>12mn, >>19women);diabetes mellitus;dyslipidemia;male gender;hypertension     Objective:    Today's Vitals   12/26/21 1501  Weight: 200 lb (90.7 kg)  Height: 6' (1.829 m)   Body mass index is 27.12 kg/m.     12/26/2021    3:06 PM 10/12/2021   11:45 AM 05/14/2021    4:24 PM 01/03/2021    9:40 PM 12/31/2020    2:03 PM 12/23/2020    4:00 PM 10/12/2019    7:21 PM  Advanced Directives  Does Patient Have a Medical Advance Directive? Yes No No No No Yes No  Type of [place]aramed[place]of [place] will   [place]  [place]   Copy of [place]? No - copy requested   No - copy requested  No - copy requested   Would patient like information on creating a medical advance directive?    No - Patient declined No - Patient declined      Current Medications (verified) Outpatient Encounter Medications as of 12/26/2021  Medication Sig   amLODipine (NORVASC) 10 MG tablet Take 1 tablet (10 mg total) by mouth daily.   Ascorbic Acid (VITAMIN C) 1000 MG tablet Take 1,000 mg by mouth daily.   aspirin 81 MG EC tablet Take 1 tablet (81 mg total) by mouth daily.   empagliflozin (JARDIANCE) 10 MG TABS tablet Take by mouth daily.   ezetimibe (ZETIA) 10 MG tablet Take 1 tablet (10 mg total) by mouth daily.   finasteride (PROPECIA) 1 MG tablet Take 1  tablet (1 mg total) by mouth daily. TAKE ONE (1) TABLET EACH DAY Strength: 1 mg   FLUoxetine (PROZAC) 20 MG capsule Take 4 capsules (80 mg total) by mouth daily.   fluticasone (FLONASE) 50 MCG/ACT nasal spray USE 1 TO 2 SPRAYS IN EACH NOSTRIL DAILY   gabapentin (NEURONTIN) 100 MG capsule Take 3 capsules (300 mg total) by mouth 3 (three) times daily. TAKE 3 CAPSULES THREE TIMES A DAY Strength: 100 mg   ibuprofen (ADVIL) 800 MG tablet TAKE ONE TABLET 3 TIMES A DAY AS NEEDED.   lisinopril (ZESTRIL) 5 MG tablet Take 1 tablet (5 mg total) by mouth 2 (two) times daily.   Melatonin 3 MG TABS Take 1 tablet (3 mg total) by mouth at bedtime. (Patient taking differently: Take 6 mg by mouth at bedtime.)   metFORMIN (GLUCOPHAGE-XR) 500 MG 24 hr tablet Take 1 tablet (500 mg total) by mouth in the morning, at noon, in the evening, and at bedtime.   Misc Natural Products (OSTEO BI-FLEX JOINT SHIELD PO) Take 2 tablets by mouth daily.   Multiple Vitamin (MULTIVITAMIN WITH MINERALS) TABS tablet Take 1 tablet by mouth daily.   Olopatadine HCl 0.2 % SOLN Place 1 drop into both eyes daily.   omega-3 acid ethyl esters (LOVAZA) 1 g capsule TAKE 4 CAPSULES DAILY   pantoprazole (PROTONIX) 40 MG tablet TAKE ONE (  1) TABLET EACH DAY   PAZEO 0.7 % SOLN Place 1 drop into both ears daily.   protein supplement shake (PREMIER PROTEIN) LIQD Take 325 mLs (11 oz total) by mouth 3 (three) times daily with meals. (Patient taking differently: Take 11 oz by mouth 2 (two) times daily as needed (for supplement).)   Semaglutide,0.25 or 0.'5MG'$ /DOS, (OZEMPIC, 0.25 OR 0.5 MG/DOSE,) 2 MG/1.5ML SOPN Inject into the skin.   sildenafil (VIAGRA) 100 MG tablet TAKE 1 TABLET BY MOUTH ONCE DAILY FOR ERECTILE DYSFUNCTION (Patient taking differently: Take 100 mg by mouth as needed for erectile dysfunction.)   No facility-administered encounter medications on file as of 12/26/2021.    Allergies (verified) Ativan [lorazepam], Dilaudid [hydromorphone],  Morphine, Seroquel [quetiapine], and Statins   History: Past Medical History:  Diagnosis Date   Acute blood loss anemia    BCC (basal cell carcinoma), face    Degeneration of intervertebral disc, site unspecified    Depressive disorder, not elsewhere classified    Esophageal stricture    Essential hypertension, benign    GERD (gastroesophageal reflux disease)    Hiatal hernia    Hyperplasia of prostate    Osteoarthrosis and allied disorders    Postoperative leak    Sleep apnea    Trauma 06/27/2017   UTI due to Klebsiella species    Past Surgical History:  Procedure Laterality Date   ABDOMINAL SURGERY     ANKLE FRACTURE SURGERY     APPENDECTOMY  11-1992   FEMUR FRACTURE SURGERY     JOINT REPLACEMENT     Left Hip REplaacement  01-1999   LUMBAR FUSION  05-1998   NASAL SEPTUM SURGERY  1989   PELVIC FRACTURE SURGERY     Right Hip Replacement  09-2002   [place] HERNIA REPAIR  02-2000   WRIST FRACTURE SURGERY     Family History  Adopted: Yes  Problem Relation Age of Onset   Lymphoma Mother    Arthritis Mother    Asthma Mother    Kidney failure Father    Hypertension Father    Hypertension Brother    Hyperlipidemia Brother    Bipolar disorder Brother    Breast cancer Sister    Social History   Socioeconomic History   Marital status: Married    Spouse name: Not on file   Number of children: 2   Years of education: Not on file   Highest education level: Not on file  Occupational History   Occupation: retired  Tobacco Use   Smoking status: Some Days    Types: Cigars   Smokeless tobacco: Never  Vaping Use   Vaping Use: Never used  Substance and Sexual Activity   Alcohol use: Not Currently   Drug use: No   Sexual activity: Yes  Other Topics Concern   Not on file  Social History Narrative   Not on file   Social Determinants of Health   Financial Resource Strain: Low Risk  (12/26/2021)   Overall Financial Resource Strain (CARDIA)     Difficulty of Paying Living Expenses: Not hard at all  Food Insecurity: No Food Insecurity (12/26/2021)   Hunger Vital Sign    Worried About Estate manager/land agent of Food in the Last Year: Never true    Ran Out of Food in the Last Year: Never true  Transportation Needs: No Transportation Needs (12/26/2021)   PRAPARE - Hydrologist (Medical): No    Lack of Transportation (  Non-Medical): No  Physical Activity: Sufficiently Active (12/23/2020)   Exercise Vital Sign    Days of Exercise per Week: 4 days    Minutes of Exercise per Session: 40 min  Stress: No Stress Concern Present (12/26/2021)   [place]    Feeling of Stress : Not at all  Social Connections: [place] (12/26/2021)   Social Connection and Isolation Panel [NHANES]    Frequency of Communication with Friends and Family: More than three times a week    Frequency of Social Gatherings with Friends and Family: More than three times a week    Attends Religious Services: More than 4 times per year    Active Member of [employer] or Organizations: Yes    Attends Music therapist: More than 4 times per year    Marital Status: Married    Tobacco Counseling Ready to quit: Not Answered Counseling given: Not Answered   Clinical Intake:  Pre-visit preparation completed: Yes  Pain : No/denies pain     Nutritional Risks: None Diabetes: No  How often do you need to have someone help you when you read instructions, pamphlets, or other written materials from your doctor or pharmacy?: 1 - Never  Diabetic?yes  Nutrition Risk Assessment:  Has the patient had any N/V/D within the last 2 months?  No  Does the patient have any non-healing wounds?  No  Has the patient had any unintentional weight loss or weight gain?  No   Diabetes:  Is the patient diabetic?  Yes  If diabetic, was a CBG obtained today?  No  Did the patient bring  in their glucometer from home?  No  How often do you monitor your CBG's? Daily .   Financial Strains and Diabetes Management:  Are you having any financial strains with the device, your supplies or your medication? No .  Does the patient want to be seen by Chronic Care Management for management of their diabetes?  No  Would the patient like to be referred to a Nutritionist or for Diabetic Management?  No   Diabetic Exams:  Diabetic Eye Exam: Completed 03/2021 Diabetic Foot Exam: Overdue, Pt has been advised about the importance in completing this exam. Pt is scheduled for diabetic foot exam on next office visit .   Interpreter Needed?: No  Information entered by :: Jadene Pierini, LPN   Activities of Daily Living    12/26/2021    3:05 PM  In your present state of health, do you have any difficulty performing the following activities:  Hearing? 0  Vision? 0  Difficulty concentrating or making decisions? 0  Walking or climbing stairs? 0  Dressing or bathing? 0  Doing errands, shopping? 0  Preparing Food and eating ? N  Using the Toilet? N  In the past six months, have you accidently leaked urine? N  Do you have problems with loss of bowel control? N  Managing your Medications? N  Managing your Finances? N  Housekeeping or managing your Housekeeping? N    Patient Care Team: Claretta Fraise, MD as PCP - General (Family Medicine)  Indicate any recent Medical Services you may have received from other than [facility] providers in the past year (date may be approximate).     Assessment:   This is a routine wellness examination for Melvin [name].  Hearing/Vision screen Vision Screening - Comments:: Wears rx glasses - up to date with routine eye exams with    Dietary  issues and exercise activities discussed: Current Exercise Habits: Home exercise routine, Type of exercise: walking, Time (Minutes): 30, Frequency (Times/Week): 3, Weekly Exercise (Minutes/Week): 90, Intensity: Mild, Exercise  limited by: cardiac condition(s)   Goals Addressed             This Visit's Progress    Patient Stated   On track    Stay healthy and active       Depression Screen    12/26/2021    3:05 PM 10/20/2021   10:51 AM 10/20/2021   10:10 AM 04/19/2021   10:35 AM 04/19/2021   10:12 AM 12/23/2020    3:59 PM 10/20/2020    2:15 PM  PHQ 2/9 Scores  PHQ - 2 Score 0 2 0 2 0 0 0  PHQ- 9 Score  6  8       Fall Risk    12/26/2021    3:02 PM 10/20/2021   10:50 AM 10/20/2021   10:10 AM 04/19/2021   10:12 AM 12/23/2020    3:56 PM  Fall Risk   Falls in the past year? 0 1 0 0 0  Number falls in past yr: 0 0   0  Injury with Fall? 0 0   0  Risk for fall due to : No Fall Risks History of fall(s)   Orthopedic patient;Other (Comment)  Risk for fall due to: Comment     neuropathy  Follow up Falls prevention discussed Falls evaluation completed   Falls prevention discussed    FALL RISK PREVENTION PERTAINING TO THE HOME:  Any stairs in or around the home? Yes  If so, are there any without handrails? No  Home free of loose throw rugs in walkways, pet beds, electrical cords, etc? Yes  Adequate lighting in your home to reduce risk of falls? Yes   ASSISTIVE DEVICES UTILIZED TO PREVENT FALLS:  Life alert? No  Use of a cane, walker or w/c? No  Grab bars in the bathroom? Yes  Shower chair or bench in shower? Yes  Elevated toilet seat or a handicapped toilet? Yes          12/26/2021    3:05 PM 12/23/2020    3:50 PM  6CIT Screen  What Year? 0 points 0 points  What month? 0 points 0 points  What time? 0 points 0 points  Count back from 20 0 points 0 points  Months in reverse 0 points 0 points  Repeat phrase 0 points 0 points  Total Score 0 points 0 points    Immunizations Immunization History  Administered Date(s) Administered   Fluad Quad(high Dose 65+) 11/11/2018, 10/20/2020, 10/20/2021   Influenza Split 12/05/2012   Influenza, High Dose Seasonal PF 12/18/2016, 12/02/2019    Influenza,inj,Quad PF,6+ Mos 12/01/2014, 11/23/2015   Moderna Sars-Covid-2 Vaccination 04/18/2019, 05/19/2019, 12/17/2019   Pneumococcal Conjugate-13 03/12/2019   Rabies Immune Globulin 10/12/2019, 10/15/2019, 10/19/2019, 10/26/2019   Rabies, IM 10/12/2019, 10/15/2019, 10/19/2019, 10/26/2019   Td 03/10/2003   Tdap 03/14/2012, 04/15/2013, 06/05/2017    TDAP status: Up to date  Flu Vaccine status: Up to date  Pneumococcal vaccine status: Up to date  Covid-19 vaccine status: Completed vaccines  Qualifies for Shingles Vaccine? Yes   Zostavax completed No   Shingrix Completed?: No.    Education has been provided regarding the importance of this vaccine. Patient has been advised to call insurance company to determine out of pocket expense if they have not yet received this vaccine. Advised may also receive vaccine at  local pharmacy or Health Dept. Verbalized acceptance and understanding.  Screening Tests Health Maintenance  Topic Date Due   Zoster Vaccines- Shingrix (1 of 2) Never done   OPHTHALMOLOGY EXAM  02/06/2009   COLONOSCOPY (Pts 45-37yr Insurance coverage will need to be confirmed)  06/09/2017   COVID-19 Vaccine (4 - Moderna risk series) 02/11/2020   HEMOGLOBIN A1C  10/20/2021   Pneumonia Vaccine 73 Years old (2 - PPSV23 or PCV20) 04/20/2022 (Originally 05/07/2019)   FOOT EXAM  04/20/2022   Diabetic kidney evaluation - Urine ACR  08/27/2022   Diabetic kidney evaluation - GFR measurement  10/21/2022   Medicare Annual Wellness (AWV)  12/27/2022   INFLUENZA VACCINE  Completed   Hepatitis C Screening  Completed   HPV VACCINES  Aged Out    Health Maintenance  Health Maintenance Due  Topic Date Due   Zoster Vaccines- Shingrix (1 of 2) Never done   OPHTHALMOLOGY EXAM  02/06/2009   COLONOSCOPY (Pts 45-420yrInsurance coverage will need to be confirmed)  06/09/2017   COVID-19 Vaccine (4 - Moderna risk series) 02/11/2020   HEMOGLOBIN A1C  10/20/2021    Colorectal cancer  screening: Referral to GI placed 12/26/2021. Pt aware the office will call re: appt.  Lung Cancer Screening: (Low Dose CT Chest recommended if Age 73-80ears, 30 pack-year currently smoking OR have quit w/in 15years.) does not qualify.   Lung Cancer Screening Referral: n/a  Additional Screening:  Hepatitis C Screening: does not qualify;   Vision Screening: Recommended annual ophthalmology exams for early detection of glaucoma and other disorders of the eye. Is the patient up to date with their annual eye exam?  Yes  Who is the provider or what is the name of the office in which the patient attends annual eye exams? Dr.[facility]  If pt is not established with a provider, would they like to be referred to a provider to establish care? No .   Dental Screening: Recommended annual dental exams for proper oral hygiene  Community Resource Referral / Chronic Care Management: CRR required this visit?  No   CCM required this visit?  No      Plan:     I have personally reviewed and noted the following in the patient's chart:   Medical and social history Use of alcohol, tobacco or illicit drugs  Current medications and supplements including opioid prescriptions. Patient is not currently taking opioid prescriptions. Functional ability and status Nutritional status Physical activity Advanced directives List of other physicians Hospitalizations, surgeries, and ER visits in previous 12 months Vitals Screenings to include cognitive, depression, and falls Referrals and appointments  In addition, I have reviewed and discussed with patient certain preventive protocols, quality metrics, and best practice recommendations. A written personalized care plan for preventive services as well as general preventive health recommendations were provided to patient.     LaDaphane ShepherdLPN   [id number] Nurse Notes: Referral Colonoscopy 12/26/2021

## 2022-01-08 ENCOUNTER — Other Ambulatory Visit: Payer: Self-pay | Admitting: Family Medicine

## 2022-01-09 ENCOUNTER — Telehealth: Payer: Self-pay | Admitting: Family Medicine

## 2022-01-09 NOTE — Telephone Encounter (Signed)
Patient would like to speak to [facility] nurse about A1C. Please call back.

## 2022-01-09 NOTE — Telephone Encounter (Signed)
CALLED PATIENT, NO ANSWER, LEFT MESSAGE TO RETURN CALL 

## 2022-01-10 ENCOUNTER — Telehealth: Payer: Self-pay | Admitting: Family Medicine

## 2022-01-10 NOTE — Telephone Encounter (Signed)
Lmtcb.

## 2022-01-10 NOTE — Telephone Encounter (Signed)
Pt returned missed call. Just wanted to know if he could walk in to have his A1C checked. Asked pt if he felt like his sugar was up or down and if he wanted me to make him an appt to see [facility] for it to have it checked. Pt says theres nothing wrong. He just wanted to know what his A1C was. Explained to pt that theres no future lab order for it to be checked and per Dr Livia Snellen notes at last visit, pt doesn't need to come back until March for his 6 mth ck unless he's having issues before then. Pt voiced understanding.

## 2022-01-10 NOTE — Telephone Encounter (Signed)
Duplicate message. 

## 2022-01-23 ENCOUNTER — Other Ambulatory Visit: Payer: Self-pay | Admitting: Family Medicine

## 2022-01-26 ENCOUNTER — Other Ambulatory Visit: Payer: Medicare Other

## 2022-02-12 ENCOUNTER — Other Ambulatory Visit: Payer: Self-pay | Admitting: Family Medicine

## 2022-02-12 DIAGNOSIS — E1169 Type 2 diabetes mellitus with other specified complication: Secondary | ICD-10-CM

## 2022-04-20 ENCOUNTER — Encounter: Payer: Self-pay | Admitting: Family Medicine

## 2022-04-20 ENCOUNTER — Ambulatory Visit (INDEPENDENT_AMBULATORY_CARE_PROVIDER_SITE_OTHER): Payer: Medicare Other | Admitting: Family Medicine

## 2022-04-20 ENCOUNTER — Ambulatory Visit (INDEPENDENT_AMBULATORY_CARE_PROVIDER_SITE_OTHER): Payer: Medicare Other

## 2022-04-20 VITALS — BP 131/69 | HR 66 | Temp 98.3°F | Ht 72.0 in | Wt 190.2 lb

## 2022-04-20 DIAGNOSIS — E1159 Type 2 diabetes mellitus with other circulatory complications: Secondary | ICD-10-CM | POA: Diagnosis not present

## 2022-04-20 DIAGNOSIS — I10 Essential (primary) hypertension: Secondary | ICD-10-CM

## 2022-04-20 DIAGNOSIS — I152 Hypertension secondary to endocrine disorders: Secondary | ICD-10-CM

## 2022-04-20 DIAGNOSIS — R051 Acute cough: Secondary | ICD-10-CM

## 2022-04-20 DIAGNOSIS — T466X5A Adverse effect of antihyperlipidemic and antiarteriosclerotic drugs, initial encounter: Secondary | ICD-10-CM

## 2022-04-20 DIAGNOSIS — K222 Esophageal obstruction: Secondary | ICD-10-CM

## 2022-04-20 DIAGNOSIS — E1169 Type 2 diabetes mellitus with other specified complication: Secondary | ICD-10-CM | POA: Diagnosis not present

## 2022-04-20 DIAGNOSIS — N400 Enlarged prostate without lower urinary tract symptoms: Secondary | ICD-10-CM

## 2022-04-20 DIAGNOSIS — G720 Drug-induced myopathy: Secondary | ICD-10-CM

## 2022-04-20 DIAGNOSIS — E785 Hyperlipidemia, unspecified: Secondary | ICD-10-CM

## 2022-04-20 DIAGNOSIS — F339 Major depressive disorder, recurrent, unspecified: Secondary | ICD-10-CM | POA: Diagnosis not present

## 2022-04-20 DIAGNOSIS — K2100 Gastro-esophageal reflux disease with esophagitis, without bleeding: Secondary | ICD-10-CM

## 2022-04-20 DIAGNOSIS — T50905A Adverse effect of unspecified drugs, medicaments and biological substances, initial encounter: Secondary | ICD-10-CM

## 2022-04-20 LAB — BAYER DCA HB A1C WAIVED: HB A1C (BAYER DCA - WAIVED): 6.3 % — ABNORMAL HIGH (ref 4.8–5.6)

## 2022-04-20 MED ORDER — AMLODIPINE BESYLATE 10 MG PO TABS: 10.0000 mg | ORAL_TABLET | Freq: Every day | ORAL | 3 refills | Status: DC

## 2022-04-20 MED ORDER — FEXOFENADINE HCL 180 MG PO TABS: 180.0000 mg | ORAL_TABLET | Freq: Every day | ORAL | 11 refills | Status: DC

## 2022-04-20 MED ORDER — FLUOXETINE HCL 20 MG PO CAPS: 80.0000 mg | ORAL_CAPSULE | Freq: Every day | ORAL | 3 refills | Status: DC

## 2022-04-20 MED ORDER — FINASTERIDE 1 MG PO TABS: 1.0000 mg | ORAL_TABLET | Freq: Every day | ORAL | 3 refills | Status: DC

## 2022-04-20 MED ORDER — PANTOPRAZOLE SODIUM 40 MG PO TBEC: DELAYED_RELEASE_TABLET | ORAL | 3 refills | Status: DC

## 2022-04-20 MED ORDER — LISINOPRIL 5 MG PO TABS: 5.0000 mg | ORAL_TABLET | Freq: Two times a day (BID) | ORAL | 3 refills | Status: DC

## 2022-04-20 MED ORDER — EZETIMIBE 10 MG PO TABS: 10.0000 mg | ORAL_TABLET | Freq: Every day | ORAL | 3 refills | Status: DC

## 2022-04-20 MED ORDER — GABAPENTIN 100 MG PO CAPS: 300.0000 mg | ORAL_CAPSULE | Freq: Three times a day (TID) | ORAL | 3 refills | Status: DC

## 2022-04-20 NOTE — Progress Notes (Signed)
Subjective:  Patient ID: Melvin Gaw., male    DOB: 18-Jun-1950  Age: 72 y.o. MRN: [id number]  CC: Medical Management of Chronic Issues   HPI Melvin [name]. presents forFollow-up of diabetes. Patient checks blood sugar at home.   Aic 6.0 at [facility] on 1/4. Wants it repeated today. HE took himself off of GLP1, semaglutide due to HA after taking it about 2-3 weeks.  Patient denies symptoms such as polyuria, polydipsia, excessive hunger, nausea No significant hypoglycemic spells noted. Medications reviewed. Pt reports taking them regularly without complication/adverse reaction being reported today.    presents for  follow-up of hypertension. Patient has no history of headache chest pain or shortness of breath or recent cough. Patient also denies symptoms of TIA such as focal numbness or weakness. Patient denies side effects from medication. States taking it regularly.   Had night sweats on two occasions last two weeks. Also had a hacking cough. It is much beter. Has had poor energy for 5 years after his accident and he has had covid 4 years. Fatigue ever since. Wonders if it is long COVID. Also thinks it was probably a little spring allergy because the cough is better  Feeling depressed. See below.     04/20/2022    2:27 PM 04/20/2022    1:57 PM 12/26/2021    3:05 PM 10/20/2021   10:51 AM 10/20/2021   10:10 AM  Depression screen PHQ 2/9  Decreased Interest 1 0 0 1 0  Down, Depressed, Hopeless 1 0 0 1 0  PHQ - 2 Score 2 0 0 2 0  Altered sleeping 1   1   Tired, decreased energy 1   1   Change in appetite 1   1   Feeling bad or failure about yourself  1   0   Trouble concentrating 1   0   Moving slowly or fidgety/restless 1   1   Suicidal thoughts 1   0   PHQ-9 Score 9   6   Difficult doing work/chores Very difficult   Somewhat difficult      History Melvin [name] has a past medical history of Acute blood loss anemia, BCC (basal cell carcinoma), face, Degeneration of intervertebral  disc, site unspecified, Depressive disorder, not elsewhere classified, Esophageal stricture, Essential hypertension, benign, GERD (gastroesophageal reflux disease), Hiatal hernia, Hyperplasia of prostate, Osteoarthrosis and allied disorders, Postoperative leak, Sleep apnea, Trauma (06/27/2017), and UTI due to Klebsiella species.   He has a past surgical history that includes Appendectomy [id number]); Nasal septum surgery (1989); Left Hip REplaacement (01-1999); Right Hip Replacement (09-2002); Lumbar fusion (05-1998); Ventral hernia repair (02-2000); Spine surgery; Joint replacement; Ankle fracture surgery; Femur fracture surgery; Pelvic fracture surgery; Wrist fracture surgery; and Abdominal surgery.   His family history includes Arthritis in his mother; Asthma in his mother; Bipolar disorder in his brother; Breast cancer in his sister; Hyperlipidemia in his brother; Hypertension in his brother and father; Kidney failure in his father; Lymphoma in his mother. He was adopted.He reports that he has been smoking cigars. He has never used smokeless tobacco. He reports that he does not currently use alcohol. He reports that he does not use drugs.  Current Outpatient Medications on File Prior to Visit  Medication Sig Dispense Refill   Ascorbic Acid (VITAMIN C) 1000 MG tablet Take 1,000 mg by mouth daily.     aspirin 81 MG EC tablet Take 1 tablet (81 mg total) by mouth daily.  empagliflozin (JARDIANCE) 10 MG TABS tablet TAKE 1 TABLET BY MOUTH EVERY MORNING WITH BREAKFAST 30 tablet 5   fluticasone (FLONASE) 50 MCG/ACT nasal spray USE 1 TO 2 SPRAYS IN EACH NOSTRIL DAILY 16 g 3   ibuprofen (ADVIL) 800 MG tablet TAKE ONE TABLET 3 TIMES A DAY AS NEEDED. 90 tablet 2   Melatonin 3 MG TABS Take 1 tablet (3 mg total) by mouth at bedtime. (Patient taking differently: Take 6 mg by mouth at bedtime.) 30 tablet 0   metFORMIN (GLUCOPHAGE-XR) 500 MG 24 hr tablet Take 1 tablet (500 mg total) by mouth in the morning, at noon,  in the evening, and at bedtime. 360 tablet 3   Misc Natural Products (OSTEO BI-FLEX JOINT SHIELD PO) Take 2 tablets by mouth daily.     Multiple Vitamin (MULTIVITAMIN WITH MINERALS) TABS tablet Take 1 tablet by mouth daily.     Olopatadine HCl 0.2 % SOLN Place 1 drop into both eyes daily.     omega-3 acid ethyl esters (LOVAZA) 1 g capsule TAKE 4 CAPSULES DAILY 360 capsule 1   PAZEO 0.7 % SOLN Place 1 drop into both ears daily.     protein supplement shake (PREMIER PROTEIN) LIQD Take 325 mLs (11 oz total) by mouth 3 (three) times daily with meals. (Patient taking differently: Take 11 oz by mouth 2 (two) times daily as needed (for supplement).)  0   sildenafil (VIAGRA) 100 MG tablet TAKE 1 TABLET BY MOUTH ONCE DAILY FOR ERECTILE DYSFUNCTION 4 tablet 5   No current facility-administered medications on file prior to visit.    ROS Review of Systems  Objective:  BP 131/69   Pulse 66   Temp 98.3 F (36.8 C)   Ht 6' (1.829 m)   Wt 190 lb 3.2 oz (86.3 kg)   SpO2 93%   BMI 25.80 kg/m   BP Readings from Last 3 Encounters:  04/20/22 131/69  10/20/21 136/74  10/12/21 (!) 153/85    Wt Readings from Last 3 Encounters:  04/20/22 190 lb 3.2 oz (86.3 kg)  12/26/21 200 lb (90.7 kg)  10/20/21 199 lb 3.2 oz (90.4 kg)     Physical Exam    Assessment & Plan:   Melvin [name] was seen today for medical management of chronic issues.  Diagnoses and all orders for this visit:  Hypertension associated with type 2 diabetes mellitus ([place]) -     CBC with Differential/Platelet -     CMP14+EGFR -     Bayer DCA Hb A1c Waived  Hyperlipidemia associated with type 2 diabetes mellitus (HCC) -     Lipid panel -     ezetimibe (ZETIA) 10 MG tablet; Take 1 tablet (10 mg total) by mouth daily. TAKE ONE (1) TABLET BY MOUTH EVERY DAY  Primary hypertension -     amLODipine (NORVASC) 10 MG tablet; Take 1 tablet (10 mg total) by mouth daily.  Depression, recurrent (HCC) -     FLUoxetine (PROZAC) 20 MG capsule;  Take 4 capsules (80 mg total) by mouth daily.  Esophageal stricture, history of -     pantoprazole (PROTONIX) 40 MG tablet; TAKE ONE (1) TABLET EACH DAY  Gastroesophageal reflux disease with esophagitis without hemorrhage -     pantoprazole (PROTONIX) 40 MG tablet; TAKE ONE (1) TABLET EACH DAY  Benign prostatic hyperplasia without lower urinary tract symptoms -     PSA, total and free  Acute cough -     DG Chest 2 View; Future  Adverse  effect of drug, initial encounter  Statin myopathy  Other orders -     finasteride (PROPECIA) 1 MG tablet; Take 1 tablet (1 mg total) by mouth daily. TAKE ONE (1) TABLET EACH DAY Strength: 1 mg -     gabapentin (NEURONTIN) 100 MG capsule; Take 3 capsules (300 mg total) by mouth 3 (three) times daily. TAKE 3 CAPSULES THREE TIMES A DAY Strength: 100 mg -     lisinopril (ZESTRIL) 5 MG tablet; Take 1 tablet (5 mg total) by mouth 2 (two) times daily. -     fexofenadine (ALLEGRA) 180 MG tablet; Take 1 tablet (180 mg total) by mouth daily. For allergy symptoms      I have discontinued Iverson C. Toniann Fail. "Melvin [name]"'s Ozempic (0.25 or 0.5 MG/DOSE). I have also changed his ezetimibe. Additionally, I am having him start on fexofenadine. Lastly, I am having him maintain his Olopatadine HCl, Misc Natural Products (OSTEO BI-FLEX JOINT SHIELD PO), vitamin C, aspirin EC, multivitamin with minerals, protein supplement shake, melatonin, Pazeo, omega-3 acid ethyl esters, fluticasone, metFORMIN, ibuprofen, sildenafil, Jardiance, amLODipine, finasteride, FLUoxetine, gabapentin, lisinopril, and pantoprazole.  Meds ordered this encounter  Medications   amLODipine (NORVASC) 10 MG tablet    Sig: Take 1 tablet (10 mg total) by mouth daily.    Dispense:  90 tablet    Refill:  3   ezetimibe (ZETIA) 10 MG tablet    Sig: Take 1 tablet (10 mg total) by mouth daily. TAKE ONE (1) TABLET BY MOUTH EVERY DAY    Dispense:  90 tablet    Refill:  3   finasteride (PROPECIA) 1 MG tablet     Sig: Take 1 tablet (1 mg total) by mouth daily. TAKE ONE (1) TABLET EACH DAY Strength: 1 mg    Dispense:  90 tablet    Refill:  3   FLUoxetine (PROZAC) 20 MG capsule    Sig: Take 4 capsules (80 mg total) by mouth daily.    Dispense:  360 capsule    Refill:  3   gabapentin (NEURONTIN) 100 MG capsule    Sig: Take 3 capsules (300 mg total) by mouth 3 (three) times daily. TAKE 3 CAPSULES THREE TIMES A DAY Strength: 100 mg    Dispense:  810 capsule    Refill:  3   lisinopril (ZESTRIL) 5 MG tablet    Sig: Take 1 tablet (5 mg total) by mouth 2 (two) times daily.    Dispense:  180 tablet    Refill:  3   pantoprazole (PROTONIX) 40 MG tablet    Sig: TAKE ONE (1) TABLET EACH DAY    Dispense:  90 tablet    Refill:  3   fexofenadine (ALLEGRA) 180 MG tablet    Sig: Take 1 tablet (180 mg total) by mouth daily. For allergy symptoms    Dispense:  30 tablet    Refill:  11     Follow-up: Return in about 6 months (around 10/21/2022).  Claretta Fraise, M.D.

## 2022-04-21 LAB — LIPID PANEL
Chol/HDL Ratio: 3.5 ratio (ref 0.0–5.0)
Cholesterol, Total: 155 mg/dL (ref 100–199)
HDL: 44 mg/dL (ref >39)
LDL Chol Calc (NIH): 96 mg/dL (ref 0–99)
Triglycerides: 75 mg/dL (ref 0–149)
VLDL Cholesterol Cal: 15 mg/dL (ref 5–40)

## 2022-04-21 LAB — CMP14+EGFR
ALT: 22 IU/L (ref 0–44)
AST: 24 IU/L (ref 0–40)
Albumin/Globulin Ratio: 1.8 (ref 1.2–2.2)
Albumin: 4.1 g/dL (ref 3.8–4.8)
Alkaline Phosphatase: 64 IU/L (ref 44–121)
BUN/Creatinine Ratio: 32 — ABNORMAL HIGH (ref 10–24)
BUN: 21 mg/dL (ref 8–27)
Bilirubin Total: 0.3 mg/dL (ref 0.0–1.2)
CO2: 23 mmol/L (ref 20–29)
Calcium: 9.4 mg/dL (ref 8.6–10.2)
Chloride: 105 mmol/L (ref 96–106)
Creatinine, Ser: 0.66 mg/dL — ABNORMAL LOW (ref 0.76–1.27)
Globulin, Total: 2.3 g/dL (ref 1.5–4.5)
Glucose: 131 mg/dL — ABNORMAL HIGH (ref 70–99)
Potassium: 4.3 mmol/L (ref 3.5–5.2)
Sodium: 142 mmol/L (ref 134–144)
Total Protein: 6.4 g/dL (ref 6.0–8.5)
eGFR: 99 mL/min/{1.73_m2} (ref >59)

## 2022-04-21 LAB — CBC WITH DIFFERENTIAL/PLATELET
Basophils Absolute: 0 10*3/uL (ref 0.0–0.2)
Basos: 1 %
EOS (ABSOLUTE): 0.4 10*3/uL (ref 0.0–0.4)
Eos: 5 %
Hematocrit: 45.5 % (ref 37.5–51.0)
Hemoglobin: 15.7 g/dL (ref 13.0–17.7)
Immature Grans (Abs): 0 10*3/uL (ref 0.0–0.1)
Immature Granulocytes: 0 %
Lymphocytes Absolute: 2.2 10*3/uL (ref 0.7–3.1)
Lymphs: 29 %
MCH: 31.3 pg (ref 26.6–33.0)
MCHC: 34.5 g/dL (ref 31.5–35.7)
MCV: 91 fL (ref 79–97)
Monocytes Absolute: 0.4 10*3/uL (ref 0.1–0.9)
Monocytes: 6 %
Neutrophils Absolute: 4.6 10*3/uL (ref 1.4–7.0)
Neutrophils: 59 %
Platelets: 169 10*3/uL (ref 150–450)
RBC: 5.01 x10E6/uL (ref 4.14–5.80)
RDW: 12.5 % (ref 11.6–15.4)
WBC: 7.7 10*3/uL (ref 3.4–10.8)

## 2022-04-21 LAB — PSA, TOTAL AND FREE
PSA, Free Pct: 15.6 %
PSA, Free: 0.25 ng/mL
Prostate Specific Ag, Serum: 1.6 ng/mL (ref 0.0–4.0)

## 2022-04-24 NOTE — Progress Notes (Signed)
Hello Myshawn, ° °Your lab result is normal and/or stable.Some minor variations that are not significant are commonly marked abnormal, but do not represent any medical problem for you. ° °Best regards, °Dorianne Perret, M.D.

## 2022-04-24 NOTE — Progress Notes (Signed)
Your chest x-ray looked normal. Thanks, WS.

## 2022-05-11 ENCOUNTER — Other Ambulatory Visit: Payer: Self-pay | Admitting: Family Medicine

## 2022-07-11 LAB — HM DIABETES EYE EXAM

## 2022-07-22 ENCOUNTER — Other Ambulatory Visit: Payer: Self-pay | Admitting: Family Medicine

## 2022-07-29 ENCOUNTER — Other Ambulatory Visit: Payer: Self-pay | Admitting: Family Medicine

## 2022-09-24 ENCOUNTER — Other Ambulatory Visit: Payer: Self-pay | Admitting: Family Medicine

## 2022-10-21 ENCOUNTER — Other Ambulatory Visit: Payer: Self-pay | Admitting: Family Medicine

## 2022-12-24 ENCOUNTER — Other Ambulatory Visit: Payer: Self-pay | Admitting: Family Medicine

## 2022-12-24 DIAGNOSIS — I10 Essential (primary) hypertension: Secondary | ICD-10-CM

## 2022-12-28 ENCOUNTER — Ambulatory Visit (INDEPENDENT_AMBULATORY_CARE_PROVIDER_SITE_OTHER): Payer: Medicare Other

## 2022-12-28 VITALS — Ht 72.0 in | Wt 197.0 lb

## 2022-12-28 DIAGNOSIS — Z0000 Encounter for general adult medical examination without abnormal findings: Secondary | ICD-10-CM

## 2022-12-28 NOTE — Patient Instructions (Signed)
Melvin [name] , Thank you for taking time to come for your Medicare Wellness Visit. I appreciate your ongoing commitment to your health goals. Please review the following plan we discussed and let me know if I can assist you in the future.   Referrals/Orders/Follow-Ups/Clinician Recommendations:  Next Medicare Annual Wellness Visit: December 31, 2023 at 1:50 pm virtual visit  You are due for the vaccines checked below. You may have these done at your preferred pharmacy. Please have them fax the office proof of the vaccines so that we can update your chart.   [x]  Flu (due annually)  Recommended this fall either at PCP office or through your local pharmacy. The flu season starts August 1 of each year.   [x]  Shingrix (Shingles vaccine): CDC recommends 2 doses of Shingrix separated by 2-6 months for aged 13 years and older:  [x]  Pneumonia Vaccines: Recommended for adults 65 years or older  []  TDAP (Tetanus) Vaccine every 10 years:Recommended every 10 years; Please call your insurance company to determine your out of pocket expense. You also receive this vaccine at your local pharmacy or Health Dept.  [x]  Covid-19: Available now at any [facility] pharmacy (see info below)  You may also get your vaccines at any [facility] (locations listed below.) Vaccine hours are Monday - Friday 9:00 - 4:00. No appointments are required. Most insurances are accepted including Medicaid. Anyone can use the community pharmacies, and people are not required to have a [facility] provider.  Community Pharmacy Locations offering vaccines:   Sport and exercise psychologist   [facility]  10 vaccines are offered at the [employer]: Covid, flu, Tdap, shingles, RSV, pneumonia, meningococcal, hepatitis A, hepatitis B, and HPV.    This is a list of the screening recommended for you and due dates:  Health  Maintenance  Topic Date Due   Zoster (Shingles) Vaccine (1 of 2) Never done   Colon Cancer Screening  06/09/2017   Pneumonia Vaccine (2 of 2 - PPSV23 or PCV20) 05/07/2019   Flu Shot  09/07/2022   COVID-19 Vaccine (4 - 2023-24 season) 10/08/2022   Hemoglobin A1C  10/21/2022   Yearly kidney health urinalysis for diabetes  04/20/2027*   Yearly kidney function blood test for diabetes  04/20/2023   Eye exam for diabetics  07/11/2023   Complete foot exam   10/23/2023   Medicare Annual Wellness Visit  12/28/2023   DTaP/Tdap/Td vaccine (5 - Td or Tdap) 06/06/2027   Hepatitis C Screening  Completed   HPV Vaccine  Aged Out  *Topic was postponed. The date shown is not the original due date.    Advanced directives: (Declined) Advance directive discussed with you today. Even though you declined this today, please call our office should you change your mind, and we can give you the proper paperwork for you to fill out.  Next Medicare Annual Wellness Visit scheduled for next year: Yes  Preventive Care 74 Years and Older, Male Preventive care refers to lifestyle choices and visits with your health care provider that can promote health and wellness. Preventive care visits are also called wellness exams. What can I expect for my preventive care visit? Counseling During your preventive care visit, your health care provider may ask about your: Medical history, including: Past medical problems. Family medical history. History of falls. Current health, including: Emotional well-being. Home life and relationship well-being. Sexual activity. Memory  and ability to understand (cognition). Lifestyle, including: Alcohol, nicotine or tobacco, and drug use. Access to firearms. Diet, exercise, and sleep habits. Work and work Astronomer. Sunscreen use. Safety issues such as seatbelt and bike helmet use. Physical exam Your health care provider will check your: Height and weight. These may be used to  calculate your BMI (body mass index). BMI is a measurement that tells if you are at a healthy weight. Waist circumference. This measures the distance around your waistline. This measurement also tells if you are at a healthy weight and may help predict your risk of certain diseases, such as type 2 diabetes and high blood pressure. Heart rate and blood pressure. Body temperature. Skin for abnormal spots. What immunizations do I need?  Vaccines are usually given at various ages, according to a schedule. Your health care provider will recommend vaccines for you based on your age, medical history, and lifestyle or other factors, such as travel or where you work. What tests do I need? Screening Your health care provider may recommend screening tests for certain conditions. This may include: Lipid and cholesterol levels. Diabetes screening. This is done by checking your blood sugar (glucose) after you have not eaten for a while (fasting). Hepatitis C test. Hepatitis B test. HIV (human immunodeficiency virus) test. STI (sexually transmitted infection) testing, if you are at risk. Lung cancer screening. Colorectal cancer screening. Prostate cancer screening. Abdominal aortic aneurysm (AAA) screening. You may need this if you are a current or former smoker. Talk with your health care provider about your test results, treatment options, and if necessary, the need for more tests. Follow these instructions at home: Eating and drinking  Eat a diet that includes fresh fruits and vegetables, whole grains, lean protein, and low-fat dairy products. Limit your intake of foods with high amounts of sugar, saturated fats, and salt. Take vitamin and mineral supplements as recommended by your health care provider. Do not drink alcohol if your health care provider tells you not to drink. If you drink alcohol: Limit how much you have to 0-2 drinks a day. Know how much alcohol is in your drink. In the [place] one  drink equals one 12 oz bottle of beer (355 mL), one 5 oz glass of wine (148 mL), or one 1 oz glass of hard liquor (44 mL). Lifestyle Brush your teeth every morning and night with fluoride toothpaste. Floss one time each day. Exercise for at least 30 minutes 5 or more days each week. Do not use any products that contain nicotine or tobacco. These products include cigarettes, chewing tobacco, and vaping devices, such as e-cigarettes. If you need help quitting, ask your health care provider. Do not use drugs. If you are sexually active, practice safe sex. Use a condom or other form of protection to prevent STIs. Take aspirin only as told by your health care provider. Make sure that you understand how much to take and what form to take. Work with your health care provider to find out whether it is safe and beneficial for you to take aspirin daily. Ask your health care provider if you need to take a cholesterol-lowering medicine (statin). Find healthy ways to manage stress, such as: Meditation, yoga, or listening to music. Journaling. Talking to a trusted person. Spending time with friends and family. Safety Always wear your seat belt while driving or riding in a vehicle. Do not drive: If you have been drinking alcohol. Do not ride with someone who has been drinking. When you  are tired or distracted. While texting. If you have been using any mind-altering substances or drugs. Wear a helmet and other protective equipment during sports activities. If you have firearms in your house, make sure you follow all gun safety procedures. Minimize exposure to UV radiation to reduce your risk of skin cancer. What's next? Visit your health care provider once a year for an annual wellness visit. Ask your health care provider how often you should have your eyes and teeth checked. Stay up to date on all vaccines. This information is not intended to replace advice given to you by your health care provider.  Make sure you discuss any questions you have with your health care provider. Document Revised: 07/21/2020 Document Reviewed: 07/21/2020 Elsevier Patient Education  2024 [employer]. Understanding Your Risk for Falls Millions of people have serious injuries from falls each year. It is important to understand your risk of falling. Talk with your health care provider about your risk and what you can do to lower it. If you do have a serious fall, make sure to tell your provider. Falling once raises your risk of falling again. How can falls affect me? Serious injuries from falls are common. These include: Broken bones, such as hip fractures. Head injuries, such as traumatic brain injuries (TBI) or concussions. A fear of falling can cause you to avoid activities and stay at home. This can make your muscles weaker and raise your risk for a fall. What can increase my risk? There are a number of risk factors that increase your risk for falling. The more risk factors you have, the higher your risk of falling. Serious injuries from a fall happen most often to people who are older than 74 years old. Teenagers and young adults ages 63-29 are also at higher risk. Common risk factors include: Weakness in the lower body. Being generally weak or confused due to long-term (chronic) illness. Dizziness or balance problems. Poor vision. Medicines that cause dizziness or drowsiness. These may include: Medicines for your blood pressure, heart, anxiety, insomnia, or swelling (edema). Pain medicines. Muscle relaxants. Other risk factors include: Drinking alcohol. Having had a fall in the past. Having foot pain or wearing improper footwear. Working at a dangerous job. Having any of the following in your home: Tripping hazards, such as floor clutter or loose rugs. Poor lighting. Pets. Having dementia or memory loss. What actions can I take to lower my risk of falling?     Physical activity Stay  physically fit. Do strength and balance exercises. Consider taking a regular class to build strength and balance. Yoga and tai chi are good options. Vision Have your eyes checked every year and your prescription for glasses or contacts updated as needed. Shoes and walking aids Wear non-skid shoes. Wear shoes that have rubber soles and low heels. Do not wear high heels. Do not walk around the house in socks or slippers. Use a cane or walker as told by your provider. Home safety Attach secure railings on both sides of your stairs. Install grab bars for your bathtub, shower, and toilet. Use a non-skid mat in your bathtub or shower. Attach bath mats securely with double-sided, non-slip rug tape. Use good lighting in all rooms. Keep a flashlight near your bed. Make sure there is a clear path from your bed to the bathroom. Use night-lights. Do not use throw rugs. Make sure all carpeting is taped or tacked down securely. Remove all clutter from walkways and stairways, including extension cords. Repair  uneven or broken steps and floors. Avoid walking on icy or slippery surfaces. Walk on the grass instead of on icy or slick sidewalks. Use ice melter to get rid of ice on walkways in the winter. Use a cordless phone. Questions to ask your health care provider Can you help me check my risk for a fall? Do any of my medicines make me more likely to fall? Should I take a vitamin D supplement? What exercises can I do to improve my strength and balance? Should I make an appointment to have my vision checked? Do I need a bone density test to check for weak bones (osteoporosis)? Would it help to use a cane or a walker? Where to find more information Centers for Disease Control and Prevention, STEAD[employer]: TonerPromos.no Community-Based Fall Prevention Programs: TonerPromos.no [employer] on Aging: BaseRingTones.pl Contact a health care provider if: You fall at home. You are afraid of falling at home. You feel weak,  drowsy, or dizzy. This information is not intended to replace advice given to you by your health care provider. Make sure you discuss any questions you have with your health care provider. Document Revised: 09/26/2021 Document Reviewed: 09/26/2021 Elsevier Patient Education  2024 [employer].

## 2022-12-28 NOTE — Progress Notes (Signed)
Because this visit was a virtual/telehealth visit,  certain criteria was not obtained, such a blood pressure, CBG if applicable, and timed get up and go. Any medications not marked as "taking" were not mentioned during the medication reconciliation part of the visit. Any vitals not documented were not able to be obtained due to this being a telehealth visit or patient was unable to self-report a recent blood pressure reading due to a lack of equipment at home via telehealth. Vitals that have been documented are verbally provided by the patient.   Subjective:   Melvin [name]. is a 72 y.o. male who presents for Medicare Annual/Subsequent preventive examination.  Visit Complete: Virtual I connected with  Melvin [name]. on 12/28/22 by a audio enabled telemedicine application and verified that I am speaking with the correct person using two identifiers.  Patient Location: Home  Provider Location: Home Office  I discussed the limitations of evaluation and management by telemedicine. The patient expressed understanding and agreed to proceed.  Vital Signs: Because this visit was a virtual/telehealth visit, some criteria may be missing or patient reported. Any vitals not documented were not able to be obtained and vitals that have been documented are patient reported.  Patient Medicare AWV questionnaire was completed by the patient on na; I have confirmed that all information answered by patient is correct and no changes since this date.  Cardiac Risk Factors include: advanced age (>65men, >17 women);diabetes mellitus;dyslipidemia;hypertension;male gender;sedentary lifestyle     Objective:    Today's Vitals   12/28/22 1415  Weight: 197 lb (89.4 kg)  Height: 6' (1.829 m)   Body mass index is 26.72 kg/m.     12/28/2022    2:19 PM 12/26/2021    3:06 PM 10/12/2021   11:45 AM 05/14/2021    4:24 PM 01/03/2021    9:40 PM 12/31/2020    2:03 PM 12/23/2020    4:00 PM  Advanced Directives   Does Patient Have a Medical Advance Directive? No Yes No No No No Yes  Type of Furniture conservator/restorer;Living will   Healthcare Power of [place];Living will  Healthcare Power of [place];Living will  Copy of Healthcare Power of Attorney in Chart?  No - copy requested   No - copy requested  No - copy requested  Would patient like information on creating a medical advance directive? No - Patient declined    No - Patient declined No - Patient declined     Current Medications (verified) Outpatient Encounter Medications as of 12/28/2022  Medication Sig   amLODipine (NORVASC) 10 MG tablet Take 1 tablet (10 mg total) by mouth daily. **NEEDS TO BE SEEN BEFORE NEXT REFILL**   Ascorbic Acid (VITAMIN C) 1000 MG tablet Take 1,000 mg by mouth daily.   aspirin 81 MG EC tablet Take 1 tablet (81 mg total) by mouth daily.   empagliflozin (JARDIANCE) 10 MG TABS tablet TAKE 1 TABLET BY MOUTH EVERY MORNING WITH BREAKFAST   ezetimibe (ZETIA) 10 MG tablet Take 1 tablet (10 mg total) by mouth daily. TAKE ONE (1) TABLET BY MOUTH EVERY DAY   fexofenadine (ALLEGRA) 180 MG tablet Take 1 tablet (180 mg total) by mouth daily. For allergy symptoms   finasteride (PROPECIA) 1 MG tablet Take 1 tablet (1 mg total) by mouth daily. TAKE ONE (1) TABLET EACH DAY Strength: 1 mg   FLUoxetine (PROZAC) 20 MG capsule Take 4 capsules (80 mg total) by mouth daily.   fluticasone (FLONASE)  50 MCG/ACT nasal spray USE 1 TO 2 SPRAYS IN EACH NOSTRIL DAILY   gabapentin (NEURONTIN) 100 MG capsule Take 3 capsules (300 mg total) by mouth 3 (three) times daily. TAKE 3 CAPSULES THREE TIMES A DAY Strength: 100 mg   ibuprofen (ADVIL) 800 MG tablet Take 1 tablet (800 mg total) by mouth 3 (three) times daily as needed. **NEEDS TO BE SEEN BEFORE NEXT REFILL**   lisinopril (ZESTRIL) 5 MG tablet Take 1 tablet (5 mg total) by mouth 2 (two) times daily.   Melatonin 3 MG TABS Take 1 tablet (3 mg total) by mouth at bedtime. (Patient  taking differently: Take 6 mg by mouth at bedtime.)   metFORMIN (GLUCOPHAGE-XR) 500 MG 24 hr tablet TAKE 1 TABLET BY MOUTH IN THE MORNING, TAKE 1 TABLET BY MOUTH AT NOON, TAKE 1 TABLET BY MOUTH IN THE EVENING, &TAKE 1 TABLET BY MOUTH AT BEDTIME **NEEDS TO BE SEEN BEFORE NEXT REFILL**   Misc Natural Products (OSTEO BI-FLEX JOINT SHIELD PO) Take 2 tablets by mouth daily.   Multiple Vitamin (MULTIVITAMIN WITH MINERALS) TABS tablet Take 1 tablet by mouth daily.   Olopatadine HCl 0.2 % SOLN Place 1 drop into both eyes daily.   omega-3 acid ethyl esters (LOVAZA) 1 g capsule TAKE 4 CAPSULES DAILY   pantoprazole (PROTONIX) 40 MG tablet TAKE ONE (1) TABLET EACH DAY   PAZEO 0.7 % SOLN Place 1 drop into both ears daily.   protein supplement shake (PREMIER PROTEIN) LIQD Take 325 mLs (11 oz total) by mouth 3 (three) times daily with meals. (Patient taking differently: Take 11 oz by mouth 2 (two) times daily as needed (for supplement).)   sildenafil (VIAGRA) 100 MG tablet TAKE 1 TABLET BY MOUTH ONCE DAILY FOR ERECTILE DYSFUNCTION   No facility-administered encounter medications on file as of 12/28/2022.    Allergies (verified) Ativan [lorazepam], Dilaudid [hydromorphone], Morphine, Seroquel [quetiapine], and Statins   History: Past Medical History:  Diagnosis Date   Acute blood loss anemia    BCC (basal cell carcinoma), face    Degeneration of intervertebral disc, site unspecified    Depressive disorder, not elsewhere classified    Esophageal stricture    Essential hypertension, benign    GERD (gastroesophageal reflux disease)    Hiatal hernia    Hyperplasia of prostate    Osteoarthrosis and allied disorders    Postoperative leak    Sleep apnea    Trauma 06/27/2017   UTI due to Klebsiella species    Past Surgical History:  Procedure Laterality Date   ABDOMINAL SURGERY     ANKLE FRACTURE SURGERY     APPENDECTOMY  11-1992   FEMUR FRACTURE SURGERY     JOINT REPLACEMENT     Left Hip  REplaacement  01-1999   LUMBAR FUSION  05-1998   NASAL SEPTUM SURGERY  1989   PELVIC FRACTURE SURGERY     Right Hip Replacement  09-2002   SPINE SURGERY     VENTRAL HERNIA REPAIR  02-2000   WRIST FRACTURE SURGERY     Family History  Adopted: Yes  Problem Relation Age of Onset   Lymphoma Mother    Arthritis Mother    Asthma Mother    Kidney failure Father    Hypertension Father    Hypertension Brother    Hyperlipidemia Brother    Bipolar disorder Brother    Breast cancer Sister    Social History   Socioeconomic History   Marital status: Married    Spouse name:  Not on file   Number of children: 2   Years of education: Not on file   Highest education level: Not on file  Occupational History   Occupation: retired  Tobacco Use   Smoking status: Some Days    Types: Cigars   Smokeless tobacco: Never  Vaping Use   Vaping status: Never Used  Substance and Sexual Activity   Alcohol use: Not Currently   Drug use: No   Sexual activity: Yes  Other Topics Concern   Not on file  Social History Narrative   Not on file   Social Determinants of Health   Financial Resource Strain: Low Risk  (12/28/2022)   Overall Financial Resource Strain (CARDIA)    Difficulty of Paying Living Expenses: Not hard at all  Food Insecurity: No Food Insecurity (12/28/2022)   Hunger Vital Sign    Worried About Running Out of Food in the Last Year: Never true    Ran Out of Food in the Last Year: Never true  Transportation Needs: No Transportation Needs (12/28/2022)   PRAPARE - Administrator, Civil Service (Medical): No    Lack of Transportation (Non-Medical): No  Physical Activity: Sufficiently Active (12/28/2022)   Exercise Vital Sign    Days of Exercise per Week: 7 days    Minutes of Exercise per Session: 30 min  Stress: Stress Concern Present (12/28/2022)   [employer] of Occupational Health - Occupational Stress Questionnaire    Feeling of Stress : Rather much  Social  Connections: Moderately Integrated (12/28/2022)   Social Connection and Isolation Panel [NHANES]    Frequency of Communication with Friends and Family: More than three times a week    Frequency of Social Gatherings with Friends and Family: More than three times a week    Attends Religious Services: More than 4 times per year    Active Member of [employer] or Organizations: No    Attends Engineer, structural: Never    Marital Status: Married    Tobacco Counseling Ready to quit: Yes Counseling given: Yes   Clinical Intake:  Pre-visit preparation completed: Yes  Pain : No/denies pain     BMI - recorded: 26.72 Nutritional Status: BMI 25 -29 Overweight Nutritional Risks: None Diabetes: Yes CBG done?: No (telehealth visit. pt states pre-diabetic but on medication) Did pt. bring in CBG monitor from home?: No  How often do you need to have someone help you when you read instructions, pamphlets, or other written materials from your doctor or pharmacy?: 1 - Never  Interpreter Needed?: No  Information entered by :: Melvin [name] CMA   Activities of Daily Living    12/28/2022    2:16 PM  In your present state of health, do you have any difficulty performing the following activities:  Hearing? 0  Vision? 0  Difficulty concentrating or making decisions? 0  Walking or climbing stairs? 0  Dressing or bathing? 0  Doing errands, shopping? 0  Preparing Food and eating ? N  Using the Toilet? N  In the past six months, have you accidently leaked urine? N  Do you have problems with loss of bowel control? N  Managing your Medications? N  Managing your Finances? N  Housekeeping or managing your Housekeeping? N    Patient Care Team: Mechele Claude, MD as PCP - General (Family Medicine)  Indicate any recent Medical Services you may have received from other than [facility] providers in the past year (date may be approximate).  Assessment:   This is a routine wellness examination for  Melvin [name].  Hearing/Vision screen Hearing Screening - Comments:: Patient denies any hearing difficulties.   Vision Screening - Comments:: Up to date with yearly eye exams.    Goals Addressed             This Visit's Progress    Patient Stated   On track    Stay healthy and active       Depression Screen    12/28/2022    2:20 PM 04/20/2022    2:27 PM 04/20/2022    1:57 PM 12/26/2021    3:05 PM 10/20/2021   10:51 AM 10/20/2021   10:10 AM 04/19/2021   10:35 AM  PHQ 2/9 Scores  PHQ - 2 Score 1 2 0 0 2 0 2  PHQ- 9 Score 5 9   6  8     Fall Risk    12/28/2022    2:19 PM 04/20/2022    1:57 PM 12/26/2021    3:02 PM 10/20/2021   10:50 AM 10/20/2021   10:10 AM  Fall Risk   Falls in the past year? 0 0 0 1 0  Number falls in past yr: 0  0 0   Injury with Fall? 0  0 0   Risk for fall due to : No Fall Risks  No Fall Risks History of fall(s)   Follow up Falls prevention discussed  Falls prevention discussed Falls evaluation completed     MEDICARE RISK AT HOME: Medicare Risk at Home Any stairs in or around the home?: Yes If so, are there any without handrails?: No Home free of loose throw rugs in walkways, pet beds, electrical cords, etc?: Yes Adequate lighting in your home to reduce risk of falls?: Yes Life alert?: No Use of a cane, walker or w/c?: No Grab bars in the bathroom?: Yes Shower chair or bench in shower?: Yes Elevated toilet seat or a handicapped toilet?: Yes  TIMED UP AND GO:  Was the test performed?  No    Cognitive Function:        12/28/2022    2:20 PM 12/26/2021    3:05 PM 12/23/2020    3:50 PM  6CIT Screen  What Year? 0 points 0 points 0 points  What month? 0 points 0 points 0 points  What time? 0 points 0 points 0 points  Count back from 20 0 points 0 points 0 points  Months in reverse 0 points 0 points 0 points  Repeat phrase 0 points 0 points 0 points  Total Score 0 points 0 points 0 points    Immunizations Immunization History   Administered Date(s) Administered   Fluad Quad(high Dose 65+) 11/11/2018, 10/20/2020, 10/20/2021   Influenza Split 12/05/2012   Influenza, High Dose Seasonal PF 12/18/2016, 12/02/2019   Influenza,inj,Quad PF,6+ Mos 12/01/2014, 11/23/2015   Moderna Sars-Covid-2 Vaccination 04/18/2019, 05/19/2019, 12/17/2019   Pneumococcal Conjugate-13 03/12/2019   Rabies Immune Globulin 10/12/2019, 10/15/2019, 10/19/2019, 10/26/2019   Rabies, IM 10/12/2019, 10/15/2019, 10/19/2019, 10/26/2019   Td 03/10/2003   Tdap 03/14/2012, 04/15/2013, 06/05/2017    TDAP status: Up to date  Flu Vaccine status: Due, Education has been provided regarding the importance of this vaccine. Advised may receive this vaccine at local pharmacy or Health Dept. Aware to provide a copy of the vaccination record if obtained from local pharmacy or Health Dept. Verbalized acceptance and understanding.  Pneumococcal vaccine status: Due, Education has been provided regarding the importance of this vaccine. Advised  may receive this vaccine at local pharmacy or Health Dept. Aware to provide a copy of the vaccination record if obtained from local pharmacy or Health Dept. Verbalized acceptance and understanding.  Covid-19 vaccine status: Information provided on how to obtain vaccines.   Qualifies for Shingles Vaccine? Yes   Zostavax completed Yes   Shingrix Completed?: No.    Education has been provided regarding the importance of this vaccine. Patient has been advised to call insurance company to determine out of pocket expense if they have not yet received this vaccine. Advised may also receive vaccine at local pharmacy or Health Dept. Verbalized acceptance and understanding.  Screening Tests Health Maintenance  Topic Date Due   Zoster Vaccines- Shingrix (1 of 2) Never done   OPHTHALMOLOGY EXAM  02/06/2009   Colonoscopy  06/09/2017   Pneumonia Vaccine 36+ Years old (2 of 2 - PPSV23 or PCV20) 05/07/2019   FOOT EXAM  04/20/2022    INFLUENZA VACCINE  09/07/2022   COVID-19 Vaccine (4 - 2023-24 season) 10/08/2022   HEMOGLOBIN A1C  10/21/2022   Medicare Annual Wellness (AWV)  12/27/2022   Diabetic kidney evaluation - Urine ACR  04/20/2027 (Originally 12/30/1968)   Diabetic kidney evaluation - eGFR measurement  04/20/2023   DTaP/Tdap/Td (5 - Td or Tdap) 06/06/2027   Hepatitis C Screening  Completed   HPV VACCINES  Aged Out    Health Maintenance  Health Maintenance Due  Topic Date Due   Zoster Vaccines- Shingrix (1 of 2) Never done   OPHTHALMOLOGY EXAM  02/06/2009   Colonoscopy  06/09/2017   Pneumonia Vaccine 12+ Years old (2 of 2 - PPSV23 or PCV20) 05/07/2019   FOOT EXAM  04/20/2022   INFLUENZA VACCINE  09/07/2022   COVID-19 Vaccine (4 - 2023-24 season) 10/08/2022   HEMOGLOBIN A1C  10/21/2022   Medicare Annual Wellness (AWV)  12/27/2022    Colorectal Cancer Screening: Patient declined colorectal cancer screening  Lung Cancer Screening: (Low Dose CT Chest recommended if Age 29-80 years, 20 pack-year currently smoking OR have quit w/in 15years.) does not qualify.   Lung Cancer Screening Referral: na  Additional Screening:  Hepatitis C Screening: does not qualify; Completed   Vision Screening: Recommended annual ophthalmology exams for early detection of glaucoma and other disorders of the eye. Is the patient up to date with their annual eye exam?  Yes  Who is the provider or what is the name of the office in which the patient attends annual eye exams? Dr. Thedore Mins If pt is not established with a provider, would they like to be referred to a provider to establish care? No .   Dental Screening: Recommended annual dental exams for proper oral hygiene  Diabetic Foot Exam: Per patient completed yesterday, 12/27/2022 with Dr. Charlynne Pander [facility] will request records  Community Resource Referral / Chronic Care Management: CRR required this visit?  No   CCM required this visit?  No     Plan:     I  have personally reviewed and noted the following in the patient's chart:   Medical and social history Use of alcohol, tobacco or illicit drugs  Current medications and supplements including opioid prescriptions. Patient is not currently taking opioid prescriptions. Functional ability and status Nutritional status Physical activity Advanced directives List of other physicians Hospitalizations, surgeries, and ER visits in previous 12 months Vitals Screenings to include cognitive, depression, and falls Referrals and appointments  In addition, I have reviewed and discussed with patient certain preventive protocols, quality  metrics, and best practice recommendations. A written personalized care plan for preventive services as well as general preventive health recommendations were provided to patient.     Jordan Hawks Tishawn Friedhoff, CMA   12/28/2022   After Visit Summary: (MyChart) Due to this being a telephonic visit, the after visit summary with patients personalized plan was offered to patient via MyChart   Nurse Notes: see routing comment

## 2023-01-24 ENCOUNTER — Other Ambulatory Visit: Payer: Self-pay | Admitting: Family Medicine

## 2023-01-28 ENCOUNTER — Other Ambulatory Visit: Payer: Self-pay | Admitting: Family Medicine

## 2023-01-28 DIAGNOSIS — K222 Esophageal obstruction: Secondary | ICD-10-CM

## 2023-01-28 DIAGNOSIS — K2100 Gastro-esophageal reflux disease with esophagitis, without bleeding: Secondary | ICD-10-CM

## 2023-02-04 ENCOUNTER — Other Ambulatory Visit: Payer: Self-pay | Admitting: Family Medicine

## 2023-03-25 ENCOUNTER — Other Ambulatory Visit: Payer: Self-pay | Admitting: Family Medicine

## 2023-04-15 ENCOUNTER — Other Ambulatory Visit: Payer: Self-pay | Admitting: Family Medicine

## 2023-04-15 DIAGNOSIS — E1169 Type 2 diabetes mellitus with other specified complication: Secondary | ICD-10-CM

## 2023-04-15 DIAGNOSIS — K2100 Gastro-esophageal reflux disease with esophagitis, without bleeding: Secondary | ICD-10-CM

## 2023-04-15 DIAGNOSIS — I10 Essential (primary) hypertension: Secondary | ICD-10-CM

## 2023-04-15 DIAGNOSIS — K222 Esophageal obstruction: Secondary | ICD-10-CM

## 2023-04-17 ENCOUNTER — Other Ambulatory Visit: Payer: Self-pay | Admitting: Family Medicine

## 2023-04-17 DIAGNOSIS — I10 Essential (primary) hypertension: Secondary | ICD-10-CM

## 2023-04-17 NOTE — Telephone Encounter (Signed)
 Stacks pt NTBS 30-d given 04/16/23

## 2023-04-17 NOTE — Telephone Encounter (Signed)
 Scheduled appt march 13

## 2023-04-19 ENCOUNTER — Encounter: Payer: Self-pay | Admitting: Family Medicine

## 2023-04-19 ENCOUNTER — Ambulatory Visit: Admitting: Family Medicine

## 2023-04-19 VITALS — BP 124/74 | HR 71 | Temp 97.3°F | Ht 72.0 in | Wt 195.0 lb

## 2023-04-19 DIAGNOSIS — F339 Major depressive disorder, recurrent, unspecified: Secondary | ICD-10-CM | POA: Diagnosis not present

## 2023-04-19 DIAGNOSIS — Z23 Encounter for immunization: Secondary | ICD-10-CM

## 2023-04-19 DIAGNOSIS — K2100 Gastro-esophageal reflux disease with esophagitis, without bleeding: Secondary | ICD-10-CM | POA: Diagnosis not present

## 2023-04-19 DIAGNOSIS — I10 Essential (primary) hypertension: Secondary | ICD-10-CM | POA: Diagnosis not present

## 2023-04-19 DIAGNOSIS — K222 Esophageal obstruction: Secondary | ICD-10-CM | POA: Diagnosis not present

## 2023-04-19 DIAGNOSIS — E1169 Type 2 diabetes mellitus with other specified complication: Secondary | ICD-10-CM

## 2023-04-19 DIAGNOSIS — J3089 Other allergic rhinitis: Secondary | ICD-10-CM

## 2023-04-19 DIAGNOSIS — Z7984 Long term (current) use of oral hypoglycemic drugs: Secondary | ICD-10-CM

## 2023-04-19 DIAGNOSIS — J302 Other seasonal allergic rhinitis: Secondary | ICD-10-CM

## 2023-04-19 DIAGNOSIS — E785 Hyperlipidemia, unspecified: Secondary | ICD-10-CM

## 2023-04-19 LAB — BAYER DCA HB A1C WAIVED: HB A1C (BAYER DCA - WAIVED): 7.3 % — ABNORMAL HIGH (ref 4.8–5.6)

## 2023-04-19 MED ORDER — PANTOPRAZOLE SODIUM 40 MG PO TBEC: 40.0000 mg | DELAYED_RELEASE_TABLET | Freq: Every day | ORAL | 0 refills | Status: DC

## 2023-04-19 MED ORDER — LISINOPRIL 5 MG PO TABS: 5.0000 mg | ORAL_TABLET | Freq: Two times a day (BID) | ORAL | 3 refills | Status: AC

## 2023-04-19 MED ORDER — FEXOFENADINE HCL 180 MG PO TABS: 180.0000 mg | ORAL_TABLET | Freq: Every day | ORAL | 11 refills | Status: AC

## 2023-04-19 MED ORDER — METFORMIN HCL ER 500 MG PO TB24: ORAL_TABLET | ORAL | 0 refills | Status: DC

## 2023-04-19 MED ORDER — EZETIMIBE 10 MG PO TABS
10.0000 mg | ORAL_TABLET | Freq: Every day | ORAL | 0 refills | Status: DC
Start: 2023-04-19 — End: 2023-08-07

## 2023-04-19 MED ORDER — GABAPENTIN 100 MG PO CAPS: 300.0000 mg | ORAL_CAPSULE | Freq: Three times a day (TID) | ORAL | 3 refills | Status: AC

## 2023-04-19 MED ORDER — FINASTERIDE 1 MG PO TABS: 1.0000 mg | ORAL_TABLET | Freq: Every day | ORAL | 3 refills | Status: DC

## 2023-04-19 MED ORDER — FLUOXETINE HCL 20 MG PO CAPS: 80.0000 mg | ORAL_CAPSULE | Freq: Every day | ORAL | 3 refills | Status: AC

## 2023-04-19 MED ORDER — EMPAGLIFLOZIN 10 MG PO TABS: 10.0000 mg | ORAL_TABLET | Freq: Every day | ORAL | 5 refills | Status: DC

## 2023-04-19 MED ORDER — AMLODIPINE BESYLATE 10 MG PO TABS: 10.0000 mg | ORAL_TABLET | Freq: Every day | ORAL | 0 refills | Status: DC

## 2023-04-19 MED ORDER — IBUPROFEN 800 MG PO TABS: 800.0000 mg | ORAL_TABLET | Freq: Three times a day (TID) | ORAL | 0 refills | Status: DC

## 2023-04-19 NOTE — Progress Notes (Signed)
 Subjective:  Patient ID: Melvin Locket.,  male    DOB: December 20, 1950  Age: 73 y.o.    CC: Medication Refill   HPI Melvin [name]. presents for  follow-up of hypertension. Patient has no history of headache chest pain or shortness of breath or recent cough. Patient also denies symptoms of TIA such as numbness weakness lateralizing. Patient denies side effects from medication. States taking it regularly.  Patient also  in for follow-up of elevated cholesterol. Doing well without complaints on current medication. Denies side effects  including myalgia and arthralgia and nausea. Also in today for liver function testing. Currently no chest pain, shortness of breath or other cardiovascular related symptoms noted.  Follow-up of diabetes. Patient does not check blood sugar at home. Readings run between Patient denies symptoms such as excessive hunger or urinary frequency, excessive hunger, nausea No significant hypoglycemic spells noted. Medications reviewed. Pt reports taking them regularly. Pt. denies complication/adverse reaction today.    History Melvin [name] has a past medical history of Acute blood loss anemia, BCC (basal cell carcinoma), face, Degeneration of intervertebral disc, site unspecified, Depressive disorder, not elsewhere classified, Esophageal stricture, Essential hypertension, benign, GERD (gastroesophageal reflux disease), Hiatal hernia, Hyperplasia of prostate, Osteoarthrosis and allied disorders, Postoperative leak, Sleep apnea, Trauma (06/27/2017), and UTI due to Klebsiella species.   He has a past surgical history that includes Appendectomy (16-1096); Nasal septum surgery (1989); Left Hip REplaacement (01-1999); Right Hip Replacement (09-2002); Lumbar fusion (05-1998); Ventral hernia repair (02-2000); Spine surgery; Joint replacement; Ankle fracture surgery; Femur fracture surgery; Pelvic fracture surgery; Wrist fracture surgery; and Abdominal surgery.   His family history includes  Arthritis in his mother; Asthma in his mother; Bipolar disorder in his brother; Breast cancer in his sister; Hyperlipidemia in his brother; Hypertension in his brother and father; Kidney failure in his father; Lymphoma in his mother. He was adopted.He reports that he has been smoking cigars. He has never used smokeless tobacco. He reports that he does not currently use alcohol. He reports that he does not use drugs.  Current Outpatient Medications on File Prior to Visit  Medication Sig Dispense Refill   Ascorbic Acid (VITAMIN C) 1000 MG tablet Take 1,000 mg by mouth daily.     aspirin 81 MG EC tablet Take 1 tablet (81 mg total) by mouth daily.     fluticasone (FLONASE) 50 MCG/ACT nasal spray USE 1 TO 2 SPRAYS IN EACH NOSTRIL DAILY 16 g 3   Melatonin 3 MG TABS Take 1 tablet (3 mg total) by mouth at bedtime. (Patient taking differently: Take 6 mg by mouth at bedtime.) 30 tablet 0   Misc Natural Products (OSTEO BI-FLEX JOINT SHIELD PO) Take 2 tablets by mouth daily.     Multiple Vitamin (MULTIVITAMIN WITH MINERALS) TABS tablet Take 1 tablet by mouth daily.     Olopatadine HCl 0.2 % SOLN Place 1 drop into both eyes daily.     omega-3 acid ethyl esters (LOVAZA) 1 g capsule TAKE 4 CAPSULES DAILY 360 capsule 1   PAZEO 0.7 % SOLN Place 1 drop into both ears daily.     protein supplement shake (PREMIER PROTEIN) LIQD Take 325 mLs (11 oz total) by mouth 3 (three) times daily with meals. (Patient taking differently: Take 11 oz by mouth 2 (two) times daily as needed (for supplement).)  0   sildenafil (VIAGRA) 100 MG tablet TAKE 1 TABLET BY MOUTH ONCE DAILY FOR ERECTILE DYSFUNCTION 4 tablet 0   No current  facility-administered medications on file prior to visit.    ROS Review of Systems  Constitutional: Negative.  Negative for fever.  HENT: Negative.    Eyes:  Negative for visual disturbance.  Respiratory:  Negative for cough and shortness of breath.   Cardiovascular:  Negative for chest pain and leg  swelling.  Gastrointestinal:  Negative for abdominal pain, diarrhea, nausea and vomiting.  Genitourinary:  Negative for difficulty urinating.  Musculoskeletal:  Negative for arthralgias and myalgias.  Skin:  Negative for rash.  Neurological:  Negative for headaches.  Psychiatric/Behavioral:  Negative for sleep disturbance.     Objective:  BP 124/74   Pulse 71   Temp (!) 97.3 F (36.3 C)   Ht 6' (1.829 m)   Wt 195 lb (88.5 kg)   SpO2 92%   BMI 26.45 kg/m   BP Readings from Last 3 Encounters:  04/19/23 124/74  04/20/22 131/69  10/20/21 136/74    Wt Readings from Last 3 Encounters:  04/19/23 195 lb (88.5 kg)  12/28/22 197 lb (89.4 kg)  04/20/22 190 lb 3.2 oz (86.3 kg)     Physical Exam Vitals reviewed.  Constitutional:      Appearance: He is well-developed.  HENT:     Head: Normocephalic and atraumatic.     Right Ear: External ear normal.     Left Ear: External ear normal.     Mouth/Throat:     Pharynx: No oropharyngeal exudate or posterior oropharyngeal erythema.  Eyes:     Pupils: Pupils are equal, round, and reactive to light.  Cardiovascular:     Rate and Rhythm: Normal rate and regular rhythm.     Heart sounds: No murmur heard. Pulmonary:     Effort: No respiratory distress.     Breath sounds: Normal breath sounds.  Musculoskeletal:     Cervical back: Normal range of motion and neck supple.  Neurological:     Mental Status: He is alert and oriented to person, place, and time.     Diabetic Foot Exam - Simple   No data filed     Lab Results  Component Value Date   HGBA1C 7.3 (H) 04/19/2023   HGBA1C 6.3 (H) 04/20/2022   HGBA1C 6.7 (H) 04/19/2021    Assessment & Plan:   Melvin "Ed" was seen today for medication refill.  Diagnoses and all orders for this visit:  Immunization due -     Pneumococcal conjugate vaccine 20-valent (Prevnar 20)  Depression, recurrent (HCC) -     FLUoxetine (PROZAC) 20 MG capsule; Take 4 capsules (80 mg total) by  mouth daily.  Esophageal stricture, history of -     pantoprazole (PROTONIX) 40 MG tablet; Take 1 tablet (40 mg total) by mouth daily. **NEEDS TO BE SEEN BEFORE NEXT REFILL**  Gastroesophageal reflux disease with esophagitis without hemorrhage -     pantoprazole (PROTONIX) 40 MG tablet; Take 1 tablet (40 mg total) by mouth daily. **NEEDS TO BE SEEN BEFORE NEXT REFILL**  Primary hypertension -     CBC with Differential/Platelet -     CMP14+EGFR -     amLODipine (NORVASC) 10 MG tablet; Take 1 tablet (10 mg total) by mouth daily. **NEEDS TO BE SEEN BEFORE NEXT REFILL**  Hyperlipidemia associated with type 2 diabetes mellitus (HCC) -     Bayer DCA Hb A1c Waived -     Lipid panel -     ezetimibe (ZETIA) 10 MG tablet; Take 1 tablet (10 mg total) by mouth daily. **NEEDS TO  BE SEEN BEFORE NEXT REFILL**  Seasonal and perennial allergic rhinitis -     Ambulatory referral to Allergy  Encounter for immunization -     Flu Vaccine Trivalent High Dose (Fluad)  Other orders -     ibuprofen (ADVIL) 800 MG tablet; Take 1 tablet (800 mg total) by mouth 3 (three) times daily. -     gabapentin (NEURONTIN) 100 MG capsule; Take 3 capsules (300 mg total) by mouth 3 (three) times daily. TAKE 3 CAPSULES THREE TIMES A DAY Strength: 100 mg -     metFORMIN (GLUCOPHAGE-XR) 500 MG 24 hr tablet; TAKE 1 TABLET BY MOUTH IN THE MORNING, TAKE 1 TABLET BY MOUTH AT NOON, TAKE 1 TABLET BY MOUTH IN THE EVENING, &TAKE 1 TABLET BY MOUTH AT BEDTIME **NEEDS TO BE SEEN BEFORE NEXT REFILL** -     empagliflozin (JARDIANCE) 10 MG TABS tablet; Take 1 tablet (10 mg total) by mouth daily with breakfast. -     lisinopril (ZESTRIL) 5 MG tablet; Take 1 tablet (5 mg total) by mouth 2 (two) times daily. -     fexofenadine (ALLEGRA) 180 MG tablet; Take 1 tablet (180 mg total) by mouth daily. For allergy symptoms -     finasteride (PROPECIA) 1 MG tablet; Take 1 tablet (1 mg total) by mouth daily. TAKE ONE (1) TABLET EACH DAY Strength: 1  mg   I have changed Melvin [name]. "Ed"'s Jardiance to empagliflozin. I have also changed his ibuprofen. I am also having him maintain his Olopatadine HCl, Misc Natural Products (OSTEO BI-FLEX JOINT SHIELD PO), vitamin C, aspirin EC, multivitamin with minerals, protein supplement shake, melatonin, Pazeo, omega-3 acid ethyl esters, fluticasone, sildenafil, FLUoxetine, gabapentin, metFORMIN, lisinopril, pantoprazole, fexofenadine, amLODipine, ezetimibe, and finasteride.  Meds ordered this encounter  Medications   ibuprofen (ADVIL) 800 MG tablet    Sig: Take 1 tablet (800 mg total) by mouth 3 (three) times daily.    Dispense:  90 tablet    Refill:  0   FLUoxetine (PROZAC) 20 MG capsule    Sig: Take 4 capsules (80 mg total) by mouth daily.    Dispense:  360 capsule    Refill:  3   gabapentin (NEURONTIN) 100 MG capsule    Sig: Take 3 capsules (300 mg total) by mouth 3 (three) times daily. TAKE 3 CAPSULES THREE TIMES A DAY Strength: 100 mg    Dispense:  810 capsule    Refill:  3   metFORMIN (GLUCOPHAGE-XR) 500 MG 24 hr tablet    Sig: TAKE 1 TABLET BY MOUTH IN THE MORNING, TAKE 1 TABLET BY MOUTH AT NOON, TAKE 1 TABLET BY MOUTH IN THE EVENING, &TAKE 1 TABLET BY MOUTH AT BEDTIME **NEEDS TO BE SEEN BEFORE NEXT REFILL**    Dispense:  120 tablet    Refill:  0   empagliflozin (JARDIANCE) 10 MG TABS tablet    Sig: Take 1 tablet (10 mg total) by mouth daily with breakfast.    Dispense:  30 tablet    Refill:  5   lisinopril (ZESTRIL) 5 MG tablet    Sig: Take 1 tablet (5 mg total) by mouth 2 (two) times daily.    Dispense:  180 tablet    Refill:  3   pantoprazole (PROTONIX) 40 MG tablet    Sig: Take 1 tablet (40 mg total) by mouth daily. **NEEDS TO BE SEEN BEFORE NEXT REFILL**    Dispense:  30 tablet    Refill:  0   fexofenadine (  ALLEGRA) 180 MG tablet    Sig: Take 1 tablet (180 mg total) by mouth daily. For allergy symptoms    Dispense:  30 tablet    Refill:  11   amLODipine (NORVASC) 10  MG tablet    Sig: Take 1 tablet (10 mg total) by mouth daily. **NEEDS TO BE SEEN BEFORE NEXT REFILL**    Dispense:  30 tablet    Refill:  0   ezetimibe (ZETIA) 10 MG tablet    Sig: Take 1 tablet (10 mg total) by mouth daily. **NEEDS TO BE SEEN BEFORE NEXT REFILL**    Dispense:  30 tablet    Refill:  0   finasteride (PROPECIA) 1 MG tablet    Sig: Take 1 tablet (1 mg total) by mouth daily. TAKE ONE (1) TABLET EACH DAY Strength: 1 mg    Dispense:  90 tablet    Refill:  3     Follow-up: Return in about 3 months (around 07/20/2023).  Mechele Claude, M.D.

## 2023-04-20 ENCOUNTER — Other Ambulatory Visit: Payer: Self-pay | Admitting: Family Medicine

## 2023-04-20 LAB — CBC WITH DIFFERENTIAL/PLATELET
Basophils Absolute: 0.1 10*3/uL (ref 0.0–0.2)
Basos: 1 %
EOS (ABSOLUTE): 0.3 10*3/uL (ref 0.0–0.4)
Eos: 5 %
Hematocrit: 42.8 % (ref 37.5–51.0)
Hemoglobin: 14.2 g/dL (ref 13.0–17.7)
Immature Grans (Abs): 0 10*3/uL (ref 0.0–0.1)
Immature Granulocytes: 0 %
Lymphocytes Absolute: 1.2 10*3/uL (ref 0.7–3.1)
Lymphs: 22 %
MCH: 30.3 pg (ref 26.6–33.0)
MCHC: 33.2 g/dL (ref 31.5–35.7)
MCV: 92 fL (ref 79–97)
Monocytes Absolute: 0.4 10*3/uL (ref 0.1–0.9)
Monocytes: 8 %
Neutrophils Absolute: 3.6 10*3/uL (ref 1.4–7.0)
Neutrophils: 64 %
Platelets: 171 10*3/uL (ref 150–450)
RBC: 4.68 x10E6/uL (ref 4.14–5.80)
RDW: 12.7 % (ref 11.6–15.4)
WBC: 5.6 10*3/uL (ref 3.4–10.8)

## 2023-04-20 LAB — LIPID PANEL
Chol/HDL Ratio: 3.5 ratio (ref 0.0–5.0)
Cholesterol, Total: 132 mg/dL (ref 100–199)
HDL: 38 mg/dL — ABNORMAL LOW (ref >39)
LDL Chol Calc (NIH): 82 mg/dL (ref 0–99)
Triglycerides: 53 mg/dL (ref 0–149)
VLDL Cholesterol Cal: 12 mg/dL (ref 5–40)

## 2023-04-20 LAB — CMP14+EGFR
ALT: 19 IU/L (ref 0–44)
AST: 16 IU/L (ref 0–40)
Albumin: 3.9 g/dL (ref 3.8–4.8)
Alkaline Phosphatase: 63 IU/L (ref 44–121)
BUN/Creatinine Ratio: 33 — ABNORMAL HIGH (ref 10–24)
BUN: 22 mg/dL (ref 8–27)
Bilirubin Total: 0.2 mg/dL (ref 0.0–1.2)
CO2: 23 mmol/L (ref 20–29)
Calcium: 9.1 mg/dL (ref 8.6–10.2)
Chloride: 101 mmol/L (ref 96–106)
Creatinine, Ser: 0.66 mg/dL — ABNORMAL LOW (ref 0.76–1.27)
Globulin, Total: 2.1 g/dL (ref 1.5–4.5)
Glucose: 291 mg/dL — ABNORMAL HIGH (ref 70–99)
Potassium: 4.5 mmol/L (ref 3.5–5.2)
Sodium: 137 mmol/L (ref 134–144)
Total Protein: 6 g/dL (ref 6.0–8.5)
eGFR: 98 mL/min/{1.73_m2} (ref >59)

## 2023-04-21 ENCOUNTER — Encounter: Payer: Self-pay | Admitting: Family Medicine

## 2023-04-21 ENCOUNTER — Other Ambulatory Visit: Payer: Self-pay | Admitting: Family Medicine

## 2023-04-23 ENCOUNTER — Other Ambulatory Visit: Payer: Self-pay | Admitting: Family Medicine

## 2023-05-01 ENCOUNTER — Other Ambulatory Visit: Payer: Self-pay | Admitting: Family Medicine

## 2023-05-01 MED ORDER — FINASTERIDE 1 MG PO TABS: 1.0000 mg | ORAL_TABLET | Freq: Every day | ORAL | 3 refills | Status: DC

## 2023-05-01 NOTE — Telephone Encounter (Signed)
 Copied from CRM [id number]. Topic: Clinical - Prescription Issue >> May 01, 2023 11:24 AM Antwanette L wrote: Reason for CRM: Patient is trying to get a refill on  finasteride (PROPECIA) 1 MG tablet and trulicity. The finasteride was ordered on 3/13 but the pharmacy told the patient to contact his provider. The trulicity is prescribed by Dr. Threasa Beards the patient endocrinologist. The patient prefers Dr. Darlyn Read to refill the trulicity. Patient can be contacted by phone at  [phone] and MyChart.  Preferred [facility] - [place], [place] - [address]  Phone: [phone] Fax: [phone]

## 2023-05-02 ENCOUNTER — Emergency Department (HOSPITAL_COMMUNITY)
Admission: EM | Admit: 2023-05-02 | Discharge: 2023-05-02 | Disposition: A | Discharge status: Home / Self Care | Attending: Emergency Medicine | Admitting: Emergency Medicine

## 2023-05-02 ENCOUNTER — Other Ambulatory Visit: Payer: Self-pay

## 2023-05-02 ENCOUNTER — Emergency Department (HOSPITAL_COMMUNITY)

## 2023-05-02 DIAGNOSIS — M542 Cervicalgia: Secondary | ICD-10-CM | POA: Insufficient documentation

## 2023-05-02 DIAGNOSIS — S0990XA Unspecified injury of head, initial encounter: Secondary | ICD-10-CM

## 2023-05-02 DIAGNOSIS — Z7982 Long term (current) use of aspirin: Secondary | ICD-10-CM | POA: Insufficient documentation

## 2023-05-02 DIAGNOSIS — S0181XA Laceration without foreign body of other part of head, initial encounter: Secondary | ICD-10-CM | POA: Diagnosis present

## 2023-05-02 DIAGNOSIS — W19XXXA Unspecified fall, initial encounter: Secondary | ICD-10-CM

## 2023-05-02 DIAGNOSIS — W06XXXA Fall from bed, initial encounter: Secondary | ICD-10-CM | POA: Diagnosis not present

## 2023-05-02 MED ORDER — DOUBLE ANTIBIOTIC 500-10000 UNIT/GM EX OINT
TOPICAL_OINTMENT | Freq: Once | CUTANEOUS | Status: AC
  Filled 2023-05-02: qty 1

## 2023-05-02 MED ORDER — LIDOCAINE-EPINEPHRINE (PF) 2 %-1:200000 IJ SOLN
20.0000 mL | Freq: Once | INTRAMUSCULAR | Status: AC
  Administered 2023-05-02: 20 mL
  Filled 2023-05-02: qty 20

## 2023-05-02 NOTE — Discharge Instructions (Signed)
 Please continue good wound care as discussed at the bedside.  Return to emergency department or follow-up with your primary care doctor in 7 days to have sutures removed.  Return to the emergency department immediately for any new or worsening symptoms.

## 2023-05-02 NOTE — ED Provider Notes (Signed)
 [place] EMERGENCY DEPARTMENT AT [facility] Provider Note   CSN: [id number] Arrival date & time: 05/02/23  4098     History  Chief Complaint  Patient presents with   Head Laceration    Melvin [name]. is a 73 y.o. male.  Patient is a 73 year old male who presents to the emergency department the chief complaint of of a laceration to the right side of his forehead as well as neck pain.  Patient notes that he was asleep and accidentally rolled out of bed this morning.  Patient notes that he is up-to-date on his tetanus shot.  He has no known history of bleeding disorders or current anticoagulation use.  There was no associated loss of consciousness.  He has been mentating at his baseline since the accident.  He denies any dizziness, lightheadedness or abnormal gait.  He denies any preceding symptoms to include chest pain, shortness of breath, palpitations.  He denies any current abdominal pain.  He denies any other long bone or joint pain.  He denies any numbness, paresthesias or unilateral weakness.   Head Laceration       Home Medications Prior to Admission medications   Medication Sig Start Date End Date Taking? Authorizing Provider  amLODipine (NORVASC) 10 MG tablet Take 1 tablet (10 mg total) by mouth daily. **NEEDS TO BE SEEN BEFORE NEXT REFILL** 04/19/23   Mechele Claude, MD  Ascorbic Acid (VITAMIN C) 1000 MG tablet Take 1,000 mg by mouth daily.    [provider]  aspirin 81 MG EC tablet Take 1 tablet (81 mg total) by mouth daily. 07/10/17   Love, Evlyn Kanner, PA-C  empagliflozin (JARDIANCE) 10 MG TABS tablet Take 1 tablet (10 mg total) by mouth daily with breakfast. 04/19/23   Mechele Claude, MD  ezetimibe (ZETIA) 10 MG tablet Take 1 tablet (10 mg total) by mouth daily. **NEEDS TO BE SEEN BEFORE NEXT REFILL** 04/19/23   Mechele Claude, MD  fexofenadine (ALLEGRA) 180 MG tablet Take 1 tablet (180 mg total) by mouth daily. For allergy symptoms 04/19/23   Mechele Claude, MD  finasteride (PROPECIA) 1 MG tablet Take 1 tablet (1 mg total) by mouth daily. TAKE ONE (1) TABLET EACH DAY Strength: 1 mg 05/01/23   Mechele Claude, MD  FLUoxetine (PROZAC) 20 MG capsule Take 4 capsules (80 mg total) by mouth daily. 04/19/23   Mechele Claude, MD  fluticasone (FLONASE) 50 MCG/ACT nasal spray USE 1 TO 2 SPRAYS IN EACH NOSTRIL DAILY 08/10/20   Mechele Claude, MD  gabapentin (NEURONTIN) 100 MG capsule Take 3 capsules (300 mg total) by mouth 3 (three) times daily. TAKE 3 CAPSULES THREE TIMES A DAY Strength: 100 mg 04/19/23   Mechele Claude, MD  ibuprofen (ADVIL) 800 MG tablet Take 1 tablet (800 mg total) by mouth 3 (three) times daily. 04/19/23   Mechele Claude, MD  lisinopril (ZESTRIL) 5 MG tablet Take 1 tablet (5 mg total) by mouth 2 (two) times daily. 04/19/23   Mechele Claude, MD  Melatonin 3 MG TABS Take 1 tablet (3 mg total) by mouth at bedtime. Patient taking differently: Take 6 mg by mouth at bedtime. 07/10/17   Love, Evlyn Kanner, PA-C  metFORMIN (GLUCOPHAGE-XR) 500 MG 24 hr tablet TAKE 1 TABLET BY MOUTH IN THE MORNING, TAKE 1 TABLET BY MOUTH AT NOON, TAKE 1 TABLET BY MOUTH IN THE EVENING, &TAKE 1 TABLET BY MOUTH AT BEDTIME **NEEDS TO BE SEEN BEFORE NEXT REFILL** 04/19/23   Mechele Claude, MD  Misc Natural Products (OSTEO BI-FLEX JOINT SHIELD PO) Take 2 tablets by mouth daily.    [provider]  Multiple Vitamin (MULTIVITAMIN WITH MINERALS) TABS tablet Take 1 tablet by mouth daily. 07/10/17   Love, Evlyn Kanner, PA-C  Olopatadine HCl 0.2 % SOLN Place 1 drop into both eyes daily.    [provider]  omega-3 acid ethyl esters (LOVAZA) 1 g capsule TAKE 4 CAPSULES DAILY 04/14/20   Mechele Claude, MD  pantoprazole (PROTONIX) 40 MG tablet Take 1 tablet (40 mg total) by mouth daily. **NEEDS TO BE SEEN BEFORE NEXT REFILL** 04/19/23   Mechele Claude, MD  PAZEO 0.7 % SOLN Place 1 drop into both ears daily. 09/25/18   [provider]  protein supplement shake (PREMIER  PROTEIN) LIQD Take 325 mLs (11 oz total) by mouth 3 (three) times daily with meals. Patient taking differently: Take 11 oz by mouth 2 (two) times daily as needed (for supplement). 07/10/17   Love, Evlyn Kanner, PA-C  sildenafil (VIAGRA) 100 MG tablet TAKE 1 TABLET BY MOUTH ONCE DAILY FOR ERECTILE DYSFUNCTION 01/25/23   Gabriel Earing, FNP      Allergies    Ativan [lorazepam], Dilaudid [hydromorphone], Morphine, Seroquel [quetiapine], and Statins    Review of Systems   Review of Systems  Musculoskeletal:  Positive for neck pain.  Skin:        Laceration  All other systems reviewed and are negative.   Physical Exam Updated Vital Signs BP (!) 160/89   Pulse 69   Temp 97.7 F (36.5 C) (Oral)   Resp 16   Ht 6' (1.829 m)   Wt 89.8 kg   SpO2 94%   BMI 26.85 kg/m  Physical Exam Constitutional:      Appearance: Normal appearance.  HENT:     Head: Normocephalic and atraumatic.     Nose: Nose normal.     Mouth/Throat:     Mouth: Mucous membranes are moist.  Eyes:     Extraocular Movements: Extraocular movements intact.     Conjunctiva/sclera: Conjunctivae normal.     Pupils: Pupils are equal, round, and reactive to light.  Neck:     Comments: Mild tenderness along bilateral trapezius muscles, no midline tenderness, no step-off or deformity, full range of motion Cardiovascular:     Rate and Rhythm: Normal rate and regular rhythm.     Pulses: Normal pulses.     Heart sounds: Normal heart sounds. No murmur heard.    No gallop.  Pulmonary:     Effort: Pulmonary effort is normal. No respiratory distress.     Breath sounds: Normal breath sounds. No wheezing or rales.  Chest:     Chest wall: No tenderness.  Abdominal:     General: Abdomen is flat. Bowel sounds are normal. There is no distension.     Palpations: Abdomen is soft.     Tenderness: There is no abdominal tenderness. There is no guarding.  Musculoskeletal:        General: No swelling, tenderness, deformity or signs of  injury. Normal range of motion.     Cervical back: Normal range of motion and neck supple. No rigidity.  Skin:    General: Skin is warm and dry.     Comments: 2 cm laceration noted over the right forehead just superior to the eyebrow, no active bleeding on presentation, no obvious foreign bodies noted within the wound, wound evaluated to depth with adequate lighting, able to raise eyebrows without difficulty  Neurological:  General: No focal deficit present.     Mental Status: He is alert and oriented to person, place, and time. Mental status is at baseline.     Cranial Nerves: No cranial nerve deficit.     Sensory: No sensory deficit.     Motor: No weakness.     Coordination: Coordination normal.     Gait: Gait normal.  Psychiatric:        Mood and Affect: Mood normal.        Behavior: Behavior normal.        Thought Content: Thought content normal.        Judgment: Judgment normal.     ED Results / Procedures / Treatments   Labs (all labs ordered are listed, but only abnormal results are displayed) Labs Reviewed - No data to display  EKG None  Radiology No results found.  Procedures .Laceration Repair  Date/Time: 05/02/2023 10:24 AM  Performed by: Lelon Perla, PA-C Authorized by: Lelon Perla, PA-C   Consent:    Consent obtained:  Verbal   Consent given by:  Patient   Risks, benefits, and alternatives were discussed: yes     Risks discussed:  Infection, pain, retained foreign body and need for additional repair   Alternatives discussed:  No treatment, delayed treatment and observation Universal protocol:    Procedure explained and questions answered to patient or proxy's satisfaction: yes     Relevant documents present and verified: yes     Imaging studies available: yes     Immediately prior to procedure, a time out was called: yes     Patient identity confirmed:  Verbally with patient, arm band, provided demographic data and hospital-assigned  identification number Anesthesia:    Anesthesia method:  Local infiltration   Local anesthetic:  Lidocaine 2% WITH epi Laceration details:    Location:  Face   Face location:  R eyebrow   Length (cm):  2 Pre-procedure details:    Preparation:  Patient was prepped and draped in usual sterile fashion and imaging obtained to evaluate for foreign bodies Exploration:    Limited defect created (wound extended): no     Hemostasis achieved with:  Direct pressure   Imaging obtained comment:  CT   Imaging outcome: foreign body not noted     Wound exploration: wound explored through full range of motion     Wound extent: no foreign body, no signs of injury, no nerve damage, no tendon damage and no vascular damage     Contaminated: no   Treatment:    Area cleansed with:  Shur-Clens   Amount of cleaning:  Standard   Irrigation solution:  Sterile saline   Debridement:  None   Scar revision: no   Skin repair:    Repair method:  Sutures   Suture size:  5-0   Suture material:  Prolene   Suture technique:  Simple interrupted   Number of sutures:  6 Approximation:    Approximation:  Close Repair type:    Repair type:  Simple Post-procedure details:    Dressing:  Antibiotic ointment   Procedure completion:  Tolerated well, no immediate complications     Medications Ordered in ED Medications  lidocaine-EPINEPHrine (XYLOCAINE W/EPI) 2 %-1:200000 (PF) injection 20 mL (has no administration in time range)    ED Course/ Medical Decision Making/ A&P  Medical Decision Making Amount and/or Complexity of Data Reviewed Radiology: ordered.  Risk OTC drugs. Prescription drug management.  This patient presents to the ED for concern of fall, head injury, neck pain differential diagnosis includes closed head injury, laceration, intracranial hemorrhage, vertebral fracture, neck sprain    Additional history obtained:  Additional history obtained from  spouse External records from outside source obtained and reviewed including none   Imaging Studies ordered:  I ordered imaging studies including CT scan of head, cervical spine I independently visualized and interpreted imaging which showed no acute intracranial hemorrhage, no space-occupying lesions, no acute osseous injury or lesions I agree with the radiologist interpretation   Problem List / ED Course:  Patient is doing very well at this time and is stable for discharge home.  Laceration was repaired at the bedside.  Patient tolerated procedure well.  There was no signs of any foreign body within the wound and wound was evaluated to depth with adequate lighting.  Continue good wound care on outpatient basis was discussed with the patient.  He was directed to return to emergency department or follow-up with his primary care doctor in 7 days to have sutures removed.  Strict return precautions were provided for any new or worsening symptoms.  Patient had no other secondary injuries noted on physical exam.  He was nontender to palpation over all long bones and joints.  He was nontender to palpation over thoracic and lumbar spine.  He had no tenderness over chest wall and abdomen.  He had no concerning neurological deficits.  Do not suspect any further advanced imaging to include MRI is warranted.  He had full range of motion of his neck without any associated midline pain.  Patient and spouse voiced understanding to the plan and had no additional questions.   Social Determinants of Health:  None           Final Clinical Impression(s) / ED Diagnoses Final diagnoses:  None    Rx / DC Orders ED Discharge Orders     None         Lelon Perla, PA-C 05/02/23 1046    Derwood Kaplan, MD 05/02/23 1538

## 2023-05-02 NOTE — ED Triage Notes (Signed)
 Pt was asleep and fell out of bed dreaming. Pt unaware if he hit his head on the dresser or the floor. Laceration to right eyebrow, bleeding under control. Soreness to bilateral neck.

## 2023-05-09 ENCOUNTER — Emergency Department (HOSPITAL_COMMUNITY): Admission: EM | Admit: 2023-05-09 | Discharge: 2023-05-09 | Disposition: A | Discharge status: Home / Self Care

## 2023-05-09 ENCOUNTER — Other Ambulatory Visit: Payer: Self-pay | Admitting: Family Medicine

## 2023-05-09 ENCOUNTER — Encounter (HOSPITAL_COMMUNITY): Payer: Self-pay

## 2023-05-09 ENCOUNTER — Other Ambulatory Visit: Payer: Self-pay

## 2023-05-09 DIAGNOSIS — Z7982 Long term (current) use of aspirin: Secondary | ICD-10-CM | POA: Insufficient documentation

## 2023-05-09 DIAGNOSIS — Z4802 Encounter for removal of sutures: Secondary | ICD-10-CM

## 2023-05-09 DIAGNOSIS — Z4800 Encounter for change or removal of nonsurgical wound dressing: Secondary | ICD-10-CM | POA: Insufficient documentation

## 2023-05-09 NOTE — ED Triage Notes (Signed)
 Pt arrived via POV requesting removal of sutures above right eye. Pt presents in NAD.

## 2023-05-09 NOTE — ED Provider Notes (Signed)
 [place] EMERGENCY DEPARTMENT AT [facility] Provider Note   CSN: [id number] Arrival date & time: 05/09/23  1443     History  Chief Complaint  Patient presents with   Suture / Staple Removal    Melvin Sek. is a 73 y.o. male presenting for suture removal.  He had sutures placed here 7 days ago right eyebrow, no complaints, pain, swelling or drainage.  Injury occurred when he accidentally rolled out of bed striking his eyebrow.  He has been using Polysporin antibiotic ointment.  The history is provided by the patient.       Home Medications Prior to Admission medications   Medication Sig Start Date End Date Taking? Authorizing Provider  amLODipine (NORVASC) 10 MG tablet Take 1 tablet (10 mg total) by mouth daily. **NEEDS TO BE SEEN BEFORE NEXT REFILL** 04/19/23   Mechele Claude, MD  Ascorbic Acid (VITAMIN C) 1000 MG tablet Take 1,000 mg by mouth daily.    [provider]  aspirin 81 MG EC tablet Take 1 tablet (81 mg total) by mouth daily. 07/10/17   Love, Evlyn Kanner, PA-C  empagliflozin (JARDIANCE) 10 MG TABS tablet Take 1 tablet (10 mg total) by mouth daily with breakfast. 04/19/23   Mechele Claude, MD  ezetimibe (ZETIA) 10 MG tablet Take 1 tablet (10 mg total) by mouth daily. **NEEDS TO BE SEEN BEFORE NEXT REFILL** 04/19/23   Mechele Claude, MD  fexofenadine (ALLEGRA) 180 MG tablet Take 1 tablet (180 mg total) by mouth daily. For allergy symptoms 04/19/23   Mechele Claude, MD  finasteride (PROPECIA) 1 MG tablet Take 1 tablet (1 mg total) by mouth daily. TAKE ONE (1) TABLET EACH DAY Strength: 1 mg 05/01/23   Mechele Claude, MD  FLUoxetine (PROZAC) 20 MG capsule Take 4 capsules (80 mg total) by mouth daily. 04/19/23   Mechele Claude, MD  fluticasone (FLONASE) 50 MCG/ACT nasal spray USE 1 TO 2 SPRAYS IN EACH NOSTRIL DAILY 08/10/20   Mechele Claude, MD  gabapentin (NEURONTIN) 100 MG capsule Take 3 capsules (300 mg total) by mouth 3 (three) times daily. TAKE 3 CAPSULES  THREE TIMES A DAY Strength: 100 mg 04/19/23   Mechele Claude, MD  ibuprofen (ADVIL) 800 MG tablet Take 1 tablet (800 mg total) by mouth 3 (three) times daily. 04/19/23   Mechele Claude, MD  lisinopril (ZESTRIL) 5 MG tablet Take 1 tablet (5 mg total) by mouth 2 (two) times daily. 04/19/23   Mechele Claude, MD  Melatonin 3 MG TABS Take 1 tablet (3 mg total) by mouth at bedtime. Patient taking differently: Take 6 mg by mouth at bedtime. 07/10/17   Love, Evlyn Kanner, PA-C  metFORMIN (GLUCOPHAGE-XR) 500 MG 24 hr tablet TAKE 1 TABLET BY MOUTH IN THE MORNING, TAKE 1 TABLET BY MOUTH AT NOON, TAKE 1 TABLET BY MOUTH IN THE EVENING, &TAKE 1 TABLET BY MOUTH AT BEDTIME **NEEDS TO BE SEEN BEFORE NEXT REFILL** 04/19/23   Mechele Claude, MD  Misc Natural Products (OSTEO BI-FLEX JOINT SHIELD PO) Take 2 tablets by mouth daily.    [provider]  Multiple Vitamin (MULTIVITAMIN WITH MINERALS) TABS tablet Take 1 tablet by mouth daily. 07/10/17   Love, Evlyn Kanner, PA-C  Olopatadine HCl 0.2 % SOLN Place 1 drop into both eyes daily.    [provider]  omega-3 acid ethyl esters (LOVAZA) 1 g capsule TAKE 4 CAPSULES DAILY 04/14/20   Mechele Claude, MD  pantoprazole (PROTONIX) 40 MG tablet Take 1 tablet (40 mg  total) by mouth daily. **NEEDS TO BE SEEN BEFORE NEXT REFILL** 04/19/23   Mechele Claude, MD  PAZEO 0.7 % SOLN Place 1 drop into both ears daily. 09/25/18   [provider]  protein supplement shake (PREMIER PROTEIN) LIQD Take 325 mLs (11 oz total) by mouth 3 (three) times daily with meals. Patient taking differently: Take 11 oz by mouth 2 (two) times daily as needed (for supplement). 07/10/17   Love, Evlyn Kanner, PA-C  sildenafil (VIAGRA) 100 MG tablet TAKE 1 TABLET BY MOUTH ONCE DAILY FOR ERECTILE DYSFUNCTION 01/25/23   Gabriel Earing, FNP      Allergies    Ativan [lorazepam], Dilaudid [hydromorphone], Morphine, Seroquel [quetiapine], and Statins    Review of Systems   Review of Systems  Constitutional:   Negative for fever.  HENT: Negative.    Skin:  Positive for wound.  All other systems reviewed and are negative.   Physical Exam Updated Vital Signs BP (!) 155/78 (BP Location: Right Arm)   Pulse 68   Temp 98.9 F (37.2 C) (Oral)   Resp 16   Ht 6' (1.829 m)   Wt 89.8 kg   SpO2 94%   BMI 26.85 kg/m  Physical Exam Constitutional:      Appearance: He is well-developed.  HENT:     Head: Normocephalic.  Cardiovascular:     Rate and Rhythm: Normal rate.  Pulmonary:     Effort: Pulmonary effort is normal.  Skin:    Findings: Laceration present.     Comments: Well-healing laceration through right brow.  Well-approximated.  No erythema or edema  Neurological:     Mental Status: He is alert and oriented to person, place, and time.     ED Results / Procedures / Treatments   Labs (all labs ordered are listed, but only abnormal results are displayed) Labs Reviewed - No data to display  EKG None  Radiology No results found.  Procedures Procedures     SUTURE REMOVAL Performed by: Burgess Amor  Consent: Verbal consent obtained. Patient identity confirmed: provided demographic data Time out: Immediately prior to procedure a "time out" was called to verify the correct patient, procedure, equipment, support staff and site/side marked as required.  Location details: right brow  Wound Appearance: clean  Sutures/Staples Removed: 6  Facility: sutures placed in this facility Patient tolerance: Patient tolerated the procedure well with no immediate complications.    Medications Ordered in ED Medications - No data to display  ED Course/ Medical Decision Making/ A&P                                 Medical Decision Making Sutures removed without difficulty, patient tolerated well.  No anticipated complications as wound appears nearly healed.           Final Clinical Impression(s) / ED Diagnoses Final diagnoses:  Visit for suture removal    Rx / DC  Orders ED Discharge Orders     None         Victoriano Lain 05/09/23 1620    Durwin Glaze, MD 05/09/23 [phone]

## 2023-05-10 NOTE — Telephone Encounter (Signed)
 RF on 05/01/23 was set to print

## 2023-05-15 ENCOUNTER — Other Ambulatory Visit: Payer: Self-pay | Admitting: Family Medicine

## 2023-05-15 DIAGNOSIS — K2100 Gastro-esophageal reflux disease with esophagitis, without bleeding: Secondary | ICD-10-CM

## 2023-05-15 DIAGNOSIS — K222 Esophageal obstruction: Secondary | ICD-10-CM

## 2023-05-15 DIAGNOSIS — I10 Essential (primary) hypertension: Secondary | ICD-10-CM

## 2023-06-02 ENCOUNTER — Other Ambulatory Visit: Payer: Self-pay | Admitting: Family Medicine

## 2023-06-13 ENCOUNTER — Other Ambulatory Visit: Payer: Self-pay | Admitting: Family Medicine

## 2023-07-09 ENCOUNTER — Ambulatory Visit (INDEPENDENT_AMBULATORY_CARE_PROVIDER_SITE_OTHER): Admitting: Allergy

## 2023-07-09 ENCOUNTER — Encounter: Payer: Self-pay | Admitting: Allergy

## 2023-07-09 ENCOUNTER — Other Ambulatory Visit: Payer: Self-pay

## 2023-07-09 VITALS — BP 148/72 | HR 73 | Temp 98.4°F | Resp 14 | Ht 69.0 in | Wt 192.4 lb

## 2023-07-09 DIAGNOSIS — H1013 Acute atopic conjunctivitis, bilateral: Secondary | ICD-10-CM | POA: Diagnosis not present

## 2023-07-09 DIAGNOSIS — H1010 Acute atopic conjunctivitis, unspecified eye: Secondary | ICD-10-CM

## 2023-07-09 DIAGNOSIS — J309 Allergic rhinitis, unspecified: Secondary | ICD-10-CM | POA: Diagnosis not present

## 2023-07-09 NOTE — Patient Instructions (Addendum)
 Allergic rhinitis with conjunctivitis Chronic allergic rhinitis with conjunctivitis with increased severity this spring.  Flonase  effective for congestion.  Allegra  increases fatigue and became ineffective. Optivar eye drop effective for ocular symptoms.  Potential allergens include tree pollen, grass, weeds, mold.  Previous allergy shots as a child.  Skin testing preferred for updated allergy profile. - Schedule skin testing, ensure no antihistamines for 3 days prior. - Continue Flonase  for congestion.  Use 1-2 sprays each nostril daily for 1-2 weeks at a time before stopping once nasal congestion improves for maximum benefit - Consider Ryaltris combination nasal spray for congestion and drainage control.  This spray has both a nasal steroid (like Flonase ) in it and a nasal antihistamine for drainage control.  Sample provided today.  Hold Flonase  while using Ryaltris.  If covered with insurance will send prescription.  - Rotate oral antihistamines to prevent tolerance every 3-6 months or so.  Other antihistamines are Xyzal, Allegra  and Zyrtec as the over-the-counter options.  - Continue Optivar 1 drop each eye up to twice a day as needed for itchy/watery eyes.  - Provide avoidance handouts after skin testing as well as skin testing results.   Schedule skin testing visit Routine follow-up in 4-6 months or sooner if needed

## 2023-07-09 NOTE — Progress Notes (Signed)
 New Patient Note  RE: Melvin [name]. MRN: [id number] DOB: 08/26/1950 Date of Office Visit: 07/09/2023  Primary care provider: Roise Cleaver, MD  Chief Complaint: ALLERGIES  History of present illness: Melvin [name]. is a 73 y.o. male presenting today for evaluation of allergic rhinitis.  Discussed the use of AI scribe software for clinical note transcription with the patient, who gave verbal consent to proceed.  He has had allergies since childhood, primarily to environmental allergens such as ragweed and goldenrod, with symptoms predominantly in the fall. He underwent extensive allergy testing as a child and received allergy injections for several years. Over the years, he has tried various medications with limited success until he started using Flonase , which significantly improved his symptoms for about the last 20ish years.  Since the onset of the COVID-19 pandemic, he has experienced a noticeable increase in allergy symptoms, particularly in the spring. This year, his symptoms were 'ten times worse' than before, with significant fatigue following outdoor activities, resembling flu-like symptoms. Despite using Flonase , he feels unwell, with significant fatigue and eye allergy symptoms, but not increased sneezing.  He has been using Optivar eye drops this spring, which he finds more effective than his previous eye drops, Pazeo. He also tried Allegra , which initially helped but later seemed to exacerbate his fatigue, leading him to discontinue its use. He continues to use Flonase  regularly.  He has a history of food allergies from childhood noting on testing was positive to celery and possibly oranges, which he avoids these foods still. He also recalls potential allergies to chocolate but does not actively avoid it now. He does not report any current issues with common allergens such as dairy, eggs, nuts, or fish.  He does limit carbs in the diet.   He has lived in the same house  for 50 years and has had water issues, raising concerns about potential mold exposure, although he has not observed visible mold.     No history of asthma.   Review of systems: 10pt ROS negative unless noted above in HPI  Past medical history: Past Medical History:  Diagnosis Date   Acute blood loss anemia    BCC (basal cell carcinoma), face    Degeneration of intervertebral disc, site unspecified    Depressive disorder, not elsewhere classified    Esophageal stricture    Essential hypertension, benign    GERD (gastroesophageal reflux disease)    Hiatal hernia    Hyperplasia of prostate    Osteoarthrosis and allied disorders    Postoperative leak    Sleep apnea    Trauma 06/27/2017   UTI due to Klebsiella species     Past surgical history: Past Surgical History:  Procedure Laterality Date   ABDOMINAL SURGERY     ADENOIDECTOMY     ANKLE FRACTURE SURGERY     APPENDECTOMY  11/1992   FEMUR FRACTURE SURGERY     JOINT REPLACEMENT     Left Hip REplaacement  01/1999   LUMBAR FUSION  05/1998   NASAL SEPTUM SURGERY  1989   PELVIC FRACTURE SURGERY     Right Hip Replacement  09/2002   SPINE SURGERY     TONSILLECTOMY     VENTRAL HERNIA REPAIR  02/2000   WRIST FRACTURE SURGERY      Family history:  Family History  Adopted: Yes  Problem Relation Age of Onset   Lymphoma Mother    Arthritis Mother    Asthma Mother  Kidney failure Father    Hypertension Father    Hypertension Brother    Hyperlipidemia Brother    Bipolar disorder Brother    Breast cancer Sister     Social history: Lives in a home with carpeting with heat pump heating and cooling.  Dog in the home.  No concern for roaches in the home.  There is concern for mold in the home.  He is a retired Teacher, English as a foreign language.   Occupational History   Occupation: retired  Tobacco Use   Smoking status: Some Days    Types: Cigars   Smokeless tobacco: Never  Vaping Use   Vaping status: Never Used   Medication List: Current  Outpatient Medications  Medication Sig Dispense Refill   amLODipine  (NORVASC ) 10 MG tablet TAKE ONE (1) TABLET BY MOUTH EVERY DAY 30 tablet 2   Ascorbic Acid  (VITAMIN C ) 1000 MG tablet Take 1,000 mg by mouth daily.     aspirin  81 MG EC tablet Take 1 tablet (81 mg total) by mouth daily.     azelastine (OPTIVAR) 0.05 % ophthalmic solution Apply 1 drop to eye.     doxycycline  (VIBRA -TABS) 100 MG tablet Take 100 mg by mouth 2 (two) times daily.     ezetimibe  (ZETIA ) 10 MG tablet Take 1 tablet (10 mg total) by mouth daily. **NEEDS TO BE SEEN BEFORE NEXT REFILL** 30 tablet 0   fexofenadine  (ALLEGRA ) 180 MG tablet Take 1 tablet (180 mg total) by mouth daily. For allergy symptoms 30 tablet 11   finasteride  (PROPECIA ) 1 MG tablet TAKE ONE (1) TABLET EACH DAY 90 tablet 3   FLUoxetine  (PROZAC ) 20 MG capsule Take 4 capsules (80 mg total) by mouth daily. 360 capsule 3   fluticasone  (FLONASE ) 50 MCG/ACT nasal spray USE 1 TO 2 SPRAYS IN EACH NOSTRIL DAILY 16 g 3   ibuprofen  (ADVIL ) 800 MG tablet TAKE ONE (1) TABLET BY MOUTH 3 TIMES DAILY 90 tablet 2   lisinopril  (ZESTRIL ) 5 MG tablet Take 1 tablet (5 mg total) by mouth 2 (two) times daily. 180 tablet 3   Melatonin 3 MG TABS Take 1 tablet (3 mg total) by mouth at bedtime. (Patient taking differently: Take 6 mg by mouth at bedtime.) 30 tablet 0   metFORMIN  (GLUCOPHAGE -XR) 500 MG 24 hr tablet TAKE 1 TABLET BY MOUTH IN THE MORNING, TAKE 1 TABLET BY MOUTH AT NOON, TAKE 1 TABLET BY MOUTH IN THE EVENING, &TAKE 1 TABLET BY MOUTH AT BEDTIME **NEEDS TO BE SEEN BEFORE NEXT REFILL** 120 tablet 0   Misc Natural Products (OSTEO BI-FLEX JOINT SHIELD PO) Take 2 tablets by mouth daily.     Multiple Vitamin (MULTIVITAMIN WITH MINERALS) TABS tablet Take 1 tablet by mouth daily.     Olopatadine  HCl 0.2 % SOLN Place 1 drop into both eyes daily.     omega-3 acid ethyl esters (LOVAZA ) 1 g capsule TAKE 4 CAPSULES DAILY 360 capsule 1   pantoprazole  (PROTONIX ) 40 MG tablet TAKE ONE (1)  TABLET BY MOUTH EVERY DAY 30 tablet 2   PAZEO 0.7 % SOLN Place 1 drop into both ears daily.     protein supplement shake (PREMIER PROTEIN) LIQD Take 325 mLs (11 oz total) by mouth 3 (three) times daily with meals. (Patient taking differently: Take 11 oz by mouth 2 (two) times daily as needed (for supplement).)  0   sildenafil  (VIAGRA ) 100 MG tablet TAKE 1 TABLET BY MOUTH ONCE DAILY FOR ERECTILE DYSFUNCTION 4 tablet 10   gabapentin  (NEURONTIN ) 100  MG capsule Take 3 capsules (300 mg total) by mouth 3 (three) times daily. TAKE 3 CAPSULES THREE TIMES A DAY Strength: 100 mg (Patient not taking: Reported on 07/09/2023) 810 capsule 3   No current facility-administered medications for this visit.    Known medication allergies: Allergies  Allergen Reactions   Ativan [Lorazepam] Other (See Comments)    Confusion & agitation   Dilaudid [Hydromorphone] Other (See Comments)    confusion   Morphine Other (See Comments)    Headaches   Seroquel [Quetiapine] Other (See Comments)    Confusion/agitation   Statins Other (See Comments)    Muscle aches and myalgias with Lipitor,Zocor,Vytorin,Pravachol,Crestor , and Livalo even at low doses      Physical examination: Blood pressure (!) 148/72, pulse 73, temperature 98.4 F (36.9 C), resp. rate 14, height 5\' 9"  (1.753 m), weight 192 lb 6 oz (87.3 kg), SpO2 94%.  General: Alert, interactive, in no acute distress. HEENT: PERRLA, TMs pearly gray, turbinates non-edematous without discharge, post-pharynx non erythematous. Neck: Supple without lymphadenopathy. Lungs: Clear to auscultation without wheezing, rhonchi or rales. {no increased work of breathing. CV: Normal S1, S2 without murmurs. Abdomen: Nondistended, nontender. Skin: Warm and dry, without lesions or rashes. Extremities:  No clubbing, cyanosis or edema. Neuro:   Grossly intact.  Diagnostics/Labs: None today  Assessment and plan:   Allergic rhinitis with conjunctivitis Chronic allergic  rhinitis with conjunctivitis with increased severity this spring.  Flonase  effective for congestion.  Allegra  increases fatigue and became ineffective. Optivar eye drop effective for ocular symptoms.  Potential allergens include tree pollen, grass, weeds, mold.  Previous allergy shots as a child.  Skin testing preferred for updated allergy profile. - Schedule skin testing, ensure no antihistamines for 3 days prior. - Continue Flonase  for congestion.  Use 1-2 sprays each nostril daily for 1-2 weeks at a time before stopping once nasal congestion improves for maximum benefit - Consider Ryaltris combination nasal spray for congestion and drainage control.  This spray has both a nasal steroid (like Flonase ) in it and a nasal antihistamine for drainage control.  Sample provided today.  Hold Flonase  while using Ryaltris.  If covered with insurance will send prescription.  - Rotate oral antihistamines to prevent tolerance every 3-6 months or so.  Other antihistamines are Xyzal, Allegra  and Zyrtec as the over-the-counter options.  - Continue Optivar 1 drop each eye up to twice a day as needed for itchy/watery eyes.  - Provide avoidance handouts after skin testing as well as skin testing results.   Schedule skin testing visit (env 1-55 + celery, orange, chocolate) Routine follow-up in 4-6 months or sooner if needed  I appreciate the opportunity to take part in Edwar's care. Please do not hesitate to contact me with questions.  Sincerely,   Catha Clink, MD Allergy/Immunology [facility] 

## 2023-07-23 ENCOUNTER — Ambulatory Visit (INDEPENDENT_AMBULATORY_CARE_PROVIDER_SITE_OTHER): Admitting: Allergy

## 2023-07-23 ENCOUNTER — Ambulatory Visit: Admitting: Family Medicine

## 2023-07-23 ENCOUNTER — Encounter: Payer: Self-pay | Admitting: Allergy

## 2023-07-23 DIAGNOSIS — H1013 Acute atopic conjunctivitis, bilateral: Secondary | ICD-10-CM | POA: Diagnosis not present

## 2023-07-23 DIAGNOSIS — J302 Other seasonal allergic rhinitis: Secondary | ICD-10-CM | POA: Diagnosis not present

## 2023-07-23 DIAGNOSIS — J3089 Other allergic rhinitis: Secondary | ICD-10-CM | POA: Diagnosis not present

## 2023-07-23 MED ORDER — RYALTRIS 665-25 MCG/ACT NA SUSP: NASAL | 5 refills | Status: AC

## 2023-07-23 NOTE — Progress Notes (Signed)
 Follow-up Note  RE: Melvin [name]. MRN: [id number] DOB: 31-Dec-1950 Date of Office Visit: 07/23/2023   History of present illness: Melvin [name]. is a 73 y.o. male presenting today for skin testing visit.  He was last seen in the office on 07/09/23 for rhinoconjunctivitis.  He is in his usual state of health today without recent illness.  He has held antihistamines for at least 3 days for testing today.   Medication List: Current Outpatient Medications  Medication Sig Dispense Refill   amLODipine  (NORVASC ) 10 MG tablet TAKE ONE (1) TABLET BY MOUTH EVERY DAY 30 tablet 2   Ascorbic Acid  (VITAMIN C ) 1000 MG tablet Take 1,000 mg by mouth daily.     aspirin  81 MG EC tablet Take 1 tablet (81 mg total) by mouth daily.     azelastine (OPTIVAR) 0.05 % ophthalmic solution Apply 1 drop to eye.     doxycycline  (VIBRA -TABS) 100 MG tablet Take 100 mg by mouth 2 (two) times daily.     ezetimibe  (ZETIA ) 10 MG tablet Take 1 tablet (10 mg total) by mouth daily. **NEEDS TO BE SEEN BEFORE NEXT REFILL** 30 tablet 0   fexofenadine  (ALLEGRA ) 180 MG tablet Take 1 tablet (180 mg total) by mouth daily. For allergy symptoms 30 tablet 11   finasteride  (PROPECIA ) 1 MG tablet TAKE ONE (1) TABLET EACH DAY 90 tablet 3   FLUoxetine  (PROZAC ) 20 MG capsule Take 4 capsules (80 mg total) by mouth daily. 360 capsule 3   fluticasone  (FLONASE ) 50 MCG/ACT nasal spray USE 1 TO 2 SPRAYS IN EACH NOSTRIL DAILY 16 g 3   gabapentin  (NEURONTIN ) 100 MG capsule Take 3 capsules (300 mg total) by mouth 3 (three) times daily. TAKE 3 CAPSULES THREE TIMES A DAY Strength: 100 mg (Patient not taking: Reported on 07/09/2023) 810 capsule 3   ibuprofen  (ADVIL ) 800 MG tablet TAKE ONE (1) TABLET BY MOUTH 3 TIMES DAILY 90 tablet 2   lisinopril  (ZESTRIL ) 5 MG tablet Take 1 tablet (5 mg total) by mouth 2 (two) times daily. 180 tablet 3   Melatonin 3 MG TABS Take 1 tablet (3 mg total) by mouth at bedtime. (Patient taking differently: Take 6 mg by  mouth at bedtime.) 30 tablet 0   metFORMIN  (GLUCOPHAGE -XR) 500 MG 24 hr tablet TAKE 1 TABLET BY MOUTH IN THE MORNING, TAKE 1 TABLET BY MOUTH AT NOON, TAKE 1 TABLET BY MOUTH IN THE EVENING, &TAKE 1 TABLET BY MOUTH AT BEDTIME **NEEDS TO BE SEEN BEFORE NEXT REFILL** 120 tablet 0   Misc Natural Products (OSTEO BI-FLEX JOINT SHIELD PO) Take 2 tablets by mouth daily.     Multiple Vitamin (MULTIVITAMIN WITH MINERALS) TABS tablet Take 1 tablet by mouth daily.     Olopatadine  HCl 0.2 % SOLN Place 1 drop into both eyes daily.     omega-3 acid ethyl esters (LOVAZA ) 1 g capsule TAKE 4 CAPSULES DAILY 360 capsule 1   pantoprazole  (PROTONIX ) 40 MG tablet TAKE ONE (1) TABLET BY MOUTH EVERY DAY 30 tablet 2   PAZEO 0.7 % SOLN Place 1 drop into both ears daily.     protein supplement shake (PREMIER PROTEIN) LIQD Take 325 mLs (11 oz total) by mouth 3 (three) times daily with meals. (Patient taking differently: Take 11 oz by mouth 2 (two) times daily as needed (for supplement).)  0   sildenafil  (VIAGRA ) 100 MG tablet TAKE 1 TABLET BY MOUTH ONCE DAILY FOR ERECTILE DYSFUNCTION 4 tablet 10  No current facility-administered medications for this visit.     Known medication allergies: Allergies  Allergen Reactions   Ativan [Lorazepam] Other (See Comments)    Confusion & agitation   Dilaudid [Hydromorphone] Other (See Comments)    confusion   Morphine Other (See Comments)    Headaches   Seroquel [Quetiapine] Other (See Comments)    Confusion/agitation   Statins Other (See Comments)    Muscle aches and myalgias with Lipitor,Zocor,Vytorin,Pravachol,Crestor , and Livalo even at low doses    Diagnostics/Labs:  Allergy testing:   Airborne Adult Perc - 07/23/23 1336     Time Antigen Placed 1336    Allergen Manufacturer Floyd Hutchinson    Location Back    Number of Test 55    1. Control-Buffer 50% Glycerol Negative    2. Control-Histamine 2+    3. Bahia Negative    4. [place] Negative    5. Johnson Negative    6.  [place]  Blue Negative    7. Meadow Fescue Negative    8. Perennial Rye Negative    9. Timothy Negative    10. Ragweed Mix Negative    11. Cocklebur Negative    12. Plantain,  English 2+    13. Baccharis Negative    14. Dog Fennel Negative    15. Russian Thistle Negative    16. Lamb's Quarters Negative    17. Sheep Sorrell Negative    18. Rough Pigweed Negative    19. Marsh Elder, Rough 2+    20. Mugwort, Common Negative    21. Box, Elder Negative    22. Cedar, red 2+    23. Sweet Gum Negative    24. Pecan Pollen 2+    25. Pine Mix Negative    26. Walnut, Black Pollen Negative    27. Red Mulberry 2+    28. Ash Mix Negative    29. Birch Mix 2+    30. Beech American Negative    31. Cottonwood, [place] 2+    32. Hickory, White 2+    33. Maple Mix 2+    34. Oak, [place] Mix Negative    35. Sycamore Eastern Negative    36. Alternaria Alternata Negative    37. Cladosporium Herbarum Negative    38. Aspergillus Mix Negative    39. Penicillium Mix Negative    40. Bipolaris Sorokiniana (Helminthosporium) Negative    41. Drechslera Spicifera (Curvularia) Negative    42. Mucor Plumbeus Negative    43. Fusarium Moniliforme Negative    44. Aureobasidium Pullulans (pullulara) Negative    45. Rhizopus Oryzae Negative    46. Botrytis Cinera Negative    47. Epicoccum Nigrum Negative    48. Phoma Betae Negative    49. Dust Mite Mix Negative    50. Cat Hair 10,000 BAU/ml 3+    51.  Dog Epithelia Negative    52. Mixed Feathers Negative    53. Horse Epithelia Negative    54. Cockroach, German Negative    55. Tobacco Leaf Negative          Food Adult Perc - 07/23/23 1300     Time Antigen Placed 1336    Allergen Manufacturer Floyd Hutchinson    Location Back    Number of allergen test 3    51. Celery Negative    55. Orange  Negative    66. Chocolate/Cacao Bean Negative          Allergy testing results were read and interpreted by provider, documented by clinical staff.  Assessment  and plan: Allergic rhinitis with conjunctivitis Chronic allergic rhinitis with conjunctivitis with increased severity this spring.  Flonase  effective for congestion.  Allegra  increases fatigue and became ineffective. Optivar eye drop effective for ocular symptoms.   - Environmental allergy testing today showed: weeds, trees, and cat - Select food testing is negative to celery, orange and chocolate.  - Copy of test results provided.  - Avoidance measures provided. - Continue Flonase  for congestion.  Use 1-2 sprays each nostril daily for 1-2 weeks at a time before stopping once nasal congestion improves for maximum benefit - Use Ryaltris combination nasal spray for congestion and drainage control.  This spray has both a nasal steroid (like Flonase ) in it and a nasal antihistamine for drainage control.  Hold Flonase  while using Ryaltris. - Rotate oral antihistamines to prevent tolerance every 3-6 months or so.  Other antihistamines are Xyzal, Allegra  and Zyrtec as the over-the-counter options.  - Continue Optivar 1 drop each eye up to twice a day as needed for itchy/watery eyes.  - Consider allergy shots as a means of long-term control. - Allergy shots re-train and reset the immune system to ignore environmental allergens and decrease the resulting immune response to those allergens (sneezing, itchy watery eyes, runny nose, nasal congestion, etc).    - Allergy shots improve symptoms in 75-85% of patients.  - We can discuss more at a future appointment if the medications are not working for you.   Routine follow-up in 4-6 months or sooner if needed  I appreciate the opportunity to take part in Melvin [name]'s care. Please do not hesitate to contact me with questions.  Sincerely,   Catha Clink, MD Allergy/Immunology [facility]

## 2023-07-23 NOTE — Patient Instructions (Signed)
 Allergic rhinitis with conjunctivitis Chronic allergic rhinitis with conjunctivitis with increased severity this spring.  Flonase  effective for congestion.  Allegra  increases fatigue and became ineffective. Optivar eye drop effective for ocular symptoms.   - Environmental allergy testing today showed: weeds, trees, and cat - Select food testing is negative to celery, orange and chocolate.  - Copy of test results provided.  - Avoidance measures provided. - Continue Flonase  for congestion.  Use 1-2 sprays each nostril daily for 1-2 weeks at a time before stopping once nasal congestion improves for maximum benefit - Use Ryaltris combination nasal spray for congestion and drainage control.  This spray has both a nasal steroid (like Flonase ) in it and a nasal antihistamine for drainage control.  Hold Flonase  while using Ryaltris. - Rotate oral antihistamines to prevent tolerance every 3-6 months or so.  Other antihistamines are Xyzal, Allegra  and Zyrtec as the over-the-counter options.  - Continue Optivar 1 drop each eye up to twice a day as needed for itchy/watery eyes.  - Consider allergy shots as a means of long-term control. - Allergy shots re-train and reset the immune system to ignore environmental allergens and decrease the resulting immune response to those allergens (sneezing, itchy watery eyes, runny nose, nasal congestion, etc).    - Allergy shots improve symptoms in 75-85% of patients.  - We can discuss more at a future appointment if the medications are not working for you.   Routine follow-up in 4-6 months or sooner if needed

## 2023-07-30 ENCOUNTER — Ambulatory Visit: Admitting: Family Medicine

## 2023-08-07 ENCOUNTER — Other Ambulatory Visit: Payer: Self-pay | Admitting: Family Medicine

## 2023-08-07 DIAGNOSIS — I10 Essential (primary) hypertension: Secondary | ICD-10-CM

## 2023-08-07 DIAGNOSIS — E1169 Type 2 diabetes mellitus with other specified complication: Secondary | ICD-10-CM

## 2023-08-19 ENCOUNTER — Other Ambulatory Visit: Payer: Self-pay | Admitting: Family Medicine

## 2023-08-19 DIAGNOSIS — K2100 Gastro-esophageal reflux disease with esophagitis, without bleeding: Secondary | ICD-10-CM

## 2023-08-19 DIAGNOSIS — K222 Esophageal obstruction: Secondary | ICD-10-CM

## 2023-09-16 ENCOUNTER — Encounter (HOSPITAL_COMMUNITY): Payer: Self-pay | Admitting: Emergency Medicine

## 2023-09-16 ENCOUNTER — Emergency Department (HOSPITAL_COMMUNITY)

## 2023-09-16 ENCOUNTER — Emergency Department (HOSPITAL_COMMUNITY)
Admission: EM | Admit: 2023-09-16 | Discharge: 2023-09-16 | Disposition: A | Discharge status: Home / Self Care | Attending: Emergency Medicine | Admitting: Emergency Medicine

## 2023-09-16 ENCOUNTER — Other Ambulatory Visit: Payer: Self-pay

## 2023-09-16 DIAGNOSIS — R112 Nausea with vomiting, unspecified: Secondary | ICD-10-CM | POA: Insufficient documentation

## 2023-09-16 DIAGNOSIS — Z7982 Long term (current) use of aspirin: Secondary | ICD-10-CM | POA: Insufficient documentation

## 2023-09-16 DIAGNOSIS — Y9301 Activity, walking, marching and hiking: Secondary | ICD-10-CM | POA: Diagnosis not present

## 2023-09-16 DIAGNOSIS — R739 Hyperglycemia, unspecified: Secondary | ICD-10-CM | POA: Insufficient documentation

## 2023-09-16 DIAGNOSIS — M7022 Olecranon bursitis, left elbow: Secondary | ICD-10-CM | POA: Insufficient documentation

## 2023-09-16 DIAGNOSIS — R197 Diarrhea, unspecified: Secondary | ICD-10-CM | POA: Diagnosis not present

## 2023-09-16 DIAGNOSIS — R531 Weakness: Secondary | ICD-10-CM

## 2023-09-16 DIAGNOSIS — E878 Other disorders of electrolyte and fluid balance, not elsewhere classified: Secondary | ICD-10-CM | POA: Insufficient documentation

## 2023-09-16 LAB — CBC WITH DIFFERENTIAL/PLATELET
Abs Immature Granulocytes: 0.03 K/uL (ref 0.00–0.07)
Basophils Absolute: 0 K/uL (ref 0.0–0.1)
Basophils Relative: 0 %
Eosinophils Absolute: 0.1 K/uL (ref 0.0–0.5)
Eosinophils Relative: 1 %
HCT: 42.3 % (ref 39.0–52.0)
Hemoglobin: 14.2 g/dL (ref 13.0–17.0)
Immature Granulocytes: 0 %
Lymphocytes Relative: 2 %
Lymphs Abs: 0.2 K/uL — ABNORMAL LOW (ref 0.7–4.0)
MCH: 29.8 pg (ref 26.0–34.0)
MCHC: 33.6 g/dL (ref 30.0–36.0)
MCV: 88.9 fL (ref 80.0–100.0)
Monocytes Absolute: 0.5 K/uL (ref 0.1–1.0)
Monocytes Relative: 5 %
Neutro Abs: 9.5 K/uL — ABNORMAL HIGH (ref 1.7–7.7)
Neutrophils Relative %: 92 %
Platelets: 164 K/uL (ref 150–400)
RBC: 4.76 MIL/uL (ref 4.22–5.81)
RDW: 12.4 % (ref 11.5–15.5)
WBC: 10.3 K/uL (ref 4.0–10.5)
nRBC: 0 % (ref 0.0–0.2)

## 2023-09-16 LAB — URINALYSIS, ROUTINE W REFLEX MICROSCOPIC
Bilirubin Urine: NEGATIVE
Glucose, UA: >=500 mg/dL — AB
Hgb urine dipstick: NEGATIVE
Ketones, ur: 20 mg/dL — AB
Leukocytes,Ua: NEGATIVE
Nitrite: NEGATIVE
Protein, ur: 30 mg/dL — AB
Specific Gravity, Urine: 1.027 (ref 1.005–1.030)
pH: 5 (ref 5.0–8.0)

## 2023-09-16 LAB — COMPREHENSIVE METABOLIC PANEL WITH GFR
ALT: 18 U/L (ref 0–44)
AST: 17 U/L (ref 15–41)
Albumin: 3.4 g/dL — ABNORMAL LOW (ref 3.5–5.0)
Alkaline Phosphatase: 53 U/L (ref 38–126)
Anion gap: 11 (ref 5–15)
BUN: 29 mg/dL — ABNORMAL HIGH (ref 8–23)
CO2: 21 mmol/L — ABNORMAL LOW (ref 22–32)
Calcium: 8.6 mg/dL — ABNORMAL LOW (ref 8.9–10.3)
Chloride: 102 mmol/L (ref 98–111)
Creatinine, Ser: 0.75 mg/dL (ref 0.61–1.24)
GFR, Estimated: >60 mL/min (ref >60)
Glucose, Bld: 305 mg/dL — ABNORMAL HIGH (ref 70–99)
Potassium: 3.9 mmol/L (ref 3.5–5.1)
Sodium: 134 mmol/L — ABNORMAL LOW (ref 135–145)
Total Bilirubin: 0.9 mg/dL (ref 0.0–1.2)
Total Protein: 6.5 g/dL (ref 6.5–8.1)

## 2023-09-16 LAB — LACTIC ACID, PLASMA: Lactic Acid, Venous: 1.5 mmol/L (ref 0.5–1.9)

## 2023-09-16 LAB — TROPONIN I (HIGH SENSITIVITY)
Troponin I (High Sensitivity): 2 ng/L (ref <18)
Troponin I (High Sensitivity): 3 ng/L (ref <18)

## 2023-09-16 LAB — SARS CORONAVIRUS 2 BY RT PCR: SARS Coronavirus 2 by RT PCR: NEGATIVE

## 2023-09-16 LAB — LIPASE, BLOOD: Lipase: 44 U/L (ref 11–51)

## 2023-09-16 MED ORDER — ACETAMINOPHEN 325 MG PO TABS
650.0000 mg | ORAL_TABLET | Freq: Once | ORAL | Status: AC
  Administered 2023-09-16: 650 mg via ORAL
  Filled 2023-09-16: qty 2

## 2023-09-16 MED ORDER — ONDANSETRON HCL 4 MG/2ML IJ SOLN
4.0000 mg | Freq: Once | INTRAMUSCULAR | Status: AC
  Administered 2023-09-16: 4 mg via INTRAVENOUS
  Filled 2023-09-16: qty 2

## 2023-09-16 MED ORDER — SODIUM CHLORIDE 0.9 % IV BOLUS
1000.0000 mL | Freq: Once | INTRAVENOUS | Status: AC
  Administered 2023-09-16: 1000 mL via INTRAVENOUS

## 2023-09-16 MED ORDER — IOHEXOL 300 MG/ML  SOLN
100.0000 mL | Freq: Once | INTRAMUSCULAR | Status: AC | PRN
  Administered 2023-09-16: 100 mL via INTRAVENOUS

## 2023-09-16 MED ORDER — ONDANSETRON HCL 4 MG PO TABS: 4.0000 mg | ORAL_TABLET | Freq: Three times a day (TID) | ORAL | 0 refills | Status: AC | PRN

## 2023-09-16 MED ORDER — LOPERAMIDE HCL 2 MG PO CAPS: 2.0000 mg | ORAL_CAPSULE | Freq: Four times a day (QID) | ORAL | 0 refills | Status: AC | PRN

## 2023-09-16 NOTE — ED Provider Notes (Signed)
 [place] EMERGENCY DEPARTMENT AT [facility] Provider Note   CSN: [id number] Arrival date & time: 09/16/23  8143     Patient presents with: Emesis   Melvin [name]. is a 73 y.o. male.  He said for a week or 2 his stomach has been a little off and he has had some recurrence of a bursitis in his left elbow.  Today he felt too weak to really get out of bed but ate breakfast and then a few hours later had vomiting and diarrhea.  He does not think he had a fever.  No abdominal pain.  No cough or shortness of breath.  No urinary symptoms.  Did not see any blood in the vomitus or diarrhea.  No recent travel or sick contacts.  He said he feels similar to when he had sepsis a few years ago.  {Add pertinent medical, surgical, social history, OB history to YEP:67052} The history is provided by the patient.  Weakness Severity:  Moderate Duration:  1 day Timing:  Constant Progression:  Unchanged Chronicity:  Recurrent Relieved by:  Nothing Associated symptoms: diarrhea, difficulty walking, nausea and vomiting   Associated symptoms: no abdominal pain, no chest pain, no cough, no dysuria, no fever and no shortness of breath        Prior to Admission medications   Medication Sig Start Date End Date Taking? Authorizing Provider  amLODipine  (NORVASC ) 10 MG tablet TAKE ONE (1) TABLET BY MOUTH EVERY DAY 08/07/23   Zollie Lowers, MD  Ascorbic Acid  (VITAMIN C ) 1000 MG tablet Take 1,000 mg by mouth daily.    [provider]  aspirin  81 MG EC tablet Take 1 tablet (81 mg total) by mouth daily. 07/10/17   Love, Sharlet RAMAN, PA-C  azelastine (OPTIVAR) 0.05 % ophthalmic solution Apply 1 drop to eye. 06/12/23 06/11/24  [provider]  doxycycline  (VIBRA -TABS) 100 MG tablet Take 100 mg by mouth 2 (two) times daily. 07/06/23   [provider]  ezetimibe  (ZETIA ) 10 MG tablet TAKE ONE (1) TABLET BY MOUTH EVERY DAY 08/07/23   Zollie Lowers, MD  fexofenadine  (ALLEGRA ) 180 MG tablet Take 1  tablet (180 mg total) by mouth daily. For allergy  symptoms 04/19/23   Zollie Lowers, MD  finasteride  (PROPECIA ) 1 MG tablet TAKE ONE (1) TABLET EACH DAY 05/10/23   Zollie Lowers, MD  FLUoxetine  (PROZAC ) 20 MG capsule Take 4 capsules (80 mg total) by mouth daily. 04/19/23   Zollie Lowers, MD  fluticasone  (FLONASE ) 50 MCG/ACT nasal spray USE 1 TO 2 SPRAYS IN EACH NOSTRIL DAILY 08/10/20   Zollie Lowers, MD  gabapentin  (NEURONTIN ) 100 MG capsule Take 3 capsules (300 mg total) by mouth 3 (three) times daily. TAKE 3 CAPSULES THREE TIMES A DAY Strength: 100 mg Patient not taking: Reported on 07/09/2023 04/19/23   Zollie Lowers, MD  ibuprofen  (ADVIL ) 800 MG tablet TAKE ONE (1) TABLET BY MOUTH 3 TIMES DAILY 08/21/23   Zollie Lowers, MD  lisinopril  (ZESTRIL ) 5 MG tablet Take 1 tablet (5 mg total) by mouth 2 (two) times daily. 04/19/23   Zollie Lowers, MD  Melatonin 3 MG TABS Take 1 tablet (3 mg total) by mouth at bedtime. Patient taking differently: Take 6 mg by mouth at bedtime. 07/10/17   Love, Sharlet RAMAN, PA-C  metFORMIN  (GLUCOPHAGE -XR) 500 MG 24 hr tablet TAKE 1 TABLET BY MOUTH IN THE MORNING, TAKE 1 TABLET BY MOUTH AT NOON, TAKE 1 TABLET BY MOUTH IN THE EVENING, &TAKE 1 TABLET BY  MOUTH AT BEDTIME **NEEDS TO BE SEEN BEFORE NEXT REFILL** 04/19/23   Zollie Lowers, MD  Misc Natural Products (OSTEO BI-FLEX JOINT SHIELD PO) Take 2 tablets by mouth daily.    [provider]  Multiple Vitamin (MULTIVITAMIN WITH MINERALS) TABS tablet Take 1 tablet by mouth daily. 07/10/17   Love, Sharlet RAMAN, PA-C  Olopatadine  HCl 0.2 % SOLN Place 1 drop into both eyes daily.    [provider]  Olopatadine -Mometasone (RYALTRIS ) 665-25 MCG/ACT SUSP Place 1-2 sprays in each nostril 1-2 times daily as needed. 07/23/23   Jeneal Danita Macintosh, MD  omega-3 acid ethyl esters (LOVAZA ) 1 g capsule TAKE 4 CAPSULES DAILY 04/14/20   Zollie Lowers, MD  pantoprazole  (PROTONIX ) 40 MG tablet TAKE ONE (1) TABLET BY MOUTH EVERY DAY  08/20/23   Zollie Lowers, MD  PAZEO 0.7 % SOLN Place 1 drop into both ears daily. 09/25/18   [provider]  protein supplement shake (PREMIER PROTEIN) LIQD Take 325 mLs (11 oz total) by mouth 3 (three) times daily with meals. Patient taking differently: Take 11 oz by mouth 2 (two) times daily as needed (for supplement). 07/10/17   Love, Sharlet RAMAN, PA-C  sildenafil  (VIAGRA ) 100 MG tablet TAKE 1 TABLET BY MOUTH ONCE DAILY FOR ERECTILE DYSFUNCTION 06/04/23   Zollie Lowers, MD    Allergies: Ativan [lorazepam], Dilaudid [hydromorphone], Morphine, Seroquel [quetiapine], and Statins    Review of Systems  Constitutional:  Positive for fatigue. Negative for fever.  Eyes:  Negative for visual disturbance.  Respiratory:  Negative for cough and shortness of breath.   Cardiovascular:  Negative for chest pain.  Gastrointestinal:  Positive for diarrhea, nausea and vomiting. Negative for abdominal pain.  Genitourinary:  Negative for dysuria.  Neurological:  Positive for weakness.    Updated Vital Signs Ht 5' 9 (1.753 m)   Wt 87.3 kg   BMI 28.42 kg/m   Physical Exam Vitals and nursing note reviewed.  Constitutional:      General: He is not in acute distress.    Appearance: Normal appearance. He is well-developed.  HENT:     Head: Normocephalic and atraumatic.  Eyes:     Conjunctiva/sclera: Conjunctivae normal.  Cardiovascular:     Rate and Rhythm: Normal rate and regular rhythm.     Heart sounds: No murmur heard. Pulmonary:     Effort: Pulmonary effort is normal. No respiratory distress.     Breath sounds: Normal breath sounds.  Abdominal:     Palpations: Abdomen is soft.     Tenderness: There is no abdominal tenderness. There is no guarding or rebound.  Musculoskeletal:        General: Swelling and tenderness present.     Cervical back: Neck supple.     Comments: He has some mild erythema and swelling of his left elbow at the olecranon.  He has full range of motion.  Distal  pulses motor and sensation intact.  No open wounds.  Skin:    General: Skin is warm and dry.     Capillary Refill: Capillary refill takes less than 2 seconds.  Neurological:     General: No focal deficit present.     Mental Status: He is alert and oriented to person, place, and time.     Sensory: No sensory deficit.     Motor: No weakness.  Psychiatric:        Mood and Affect: Mood normal.     (all labs ordered are listed, but only abnormal results are  displayed) Labs Reviewed  CULTURE, BLOOD (ROUTINE X 2)  CULTURE, BLOOD (ROUTINE X 2)  COMPREHENSIVE METABOLIC PANEL WITH GFR  LACTIC ACID, PLASMA  LACTIC ACID, PLASMA  CBC WITH DIFFERENTIAL/PLATELET  LIPASE, BLOOD  URINALYSIS, ROUTINE W REFLEX MICROSCOPIC  TROPONIN I (HIGH SENSITIVITY)    EKG: None  Radiology: No results found.  {Document cardiac monitor, telemetry assessment procedure when appropriate:32947} Procedures   Medications Ordered in the Melvin  sodium chloride  0.9 % bolus 1,000 mL (has no administration in time range)  ondansetron  (ZOFRAN ) injection 4 mg (has no administration in time range)      {Click here for ABCD2, HEART and other calculators REFRESH Note before signing:1}                              Medical Decision Making Amount and/or Complexity of Data Reviewed Labs: ordered. Radiology: ordered.  Risk Prescription drug management.   This patient complains of ***; this involves an extensive number of treatment Options and is a complaint that carries with it a high risk of complications and morbidity. The differential includes ***  I ordered, reviewed and interpreted labs, which included *** I ordered medication *** and reviewed PMP when indicated. I ordered imaging studies which included *** and I independently    visualized and interpreted imaging which showed *** Additional history obtained from *** Previous records obtained and reviewed *** I consulted *** and discussed lab and imaging  findings and discussed disposition.  Cardiac monitoring reviewed, *** Social determinants considered, *** Critical Interventions: ***  After the interventions stated above, I reevaluated the patient and found *** Admission and further testing considered, ***   {Document critical care time when appropriate  Document review of labs and clinical decision tools ie CHADS2VASC2, etc  Document your independent review of radiology images and any outside records  Document your discussion with family members, caretakers and with consultants  Document social determinants of health affecting pt's care  Document your decision making why or why not admission, treatments were needed:32947:::1}   Final diagnoses:  None    Melvin Discharge Orders     None

## 2023-09-16 NOTE — ED Notes (Signed)
 Fluid/PO Challenge complete. Pt drank whole cup of water, said he had no nausea and no feeling of vomiting.

## 2023-09-16 NOTE — ED Notes (Signed)
 Ambulation trial complete at this time. Pt was successful in walking up and down the hallway with no n/v or dizziness.

## 2023-09-16 NOTE — Discharge Instructions (Signed)
 You were seen in the emergency department for weakness nausea vomiting diarrhea.  Your lab work showed your blood sugar to be elevated but your infectious markers were unremarkable.  CAT scan showed gallstones in your gallbladder but no evidence of gallbladder attack.  We are prescribing you some nausea medicine and some medicine for diarrhea.  Please start with a clear liquid diet and advance as tolerated.  Follow-up with your regular doctor.  Return to the emergency department if any worsening or concerning symptoms

## 2023-09-16 NOTE — ED Triage Notes (Signed)
Pt c/o n/v/d since this am.

## 2023-09-21 LAB — CULTURE, BLOOD (ROUTINE X 2)
Culture: NO GROWTH
Culture: NO GROWTH
Special Requests: ADEQUATE
Special Requests: ADEQUATE

## 2023-10-09 ENCOUNTER — Other Ambulatory Visit: Payer: Self-pay | Admitting: Family Medicine

## 2023-11-01 ENCOUNTER — Other Ambulatory Visit: Payer: Self-pay | Admitting: Family Medicine

## 2023-11-01 DIAGNOSIS — E1169 Type 2 diabetes mellitus with other specified complication: Secondary | ICD-10-CM

## 2023-11-01 DIAGNOSIS — I10 Essential (primary) hypertension: Secondary | ICD-10-CM

## 2023-11-11 ENCOUNTER — Other Ambulatory Visit: Payer: Self-pay | Admitting: Family Medicine

## 2023-11-11 DIAGNOSIS — K2100 Gastro-esophageal reflux disease with esophagitis, without bleeding: Secondary | ICD-10-CM

## 2023-11-11 DIAGNOSIS — K222 Esophageal obstruction: Secondary | ICD-10-CM

## 2023-12-06 ENCOUNTER — Encounter: Admitting: Family Medicine

## 2023-12-06 NOTE — Progress Notes (Signed)
 Erroneous visit. Pt. Has traditional medicare. No longer allowed to do video visits.  Butler Der, MD

## 2023-12-07 ENCOUNTER — Encounter: Payer: Self-pay | Admitting: Family Medicine

## 2023-12-07 ENCOUNTER — Ambulatory Visit

## 2023-12-07 VITALS — BP 138/66 | HR 78 | Ht 69.0 in | Wt 190.0 lb

## 2023-12-07 DIAGNOSIS — R5383 Other fatigue: Secondary | ICD-10-CM | POA: Diagnosis not present

## 2023-12-07 DIAGNOSIS — R5381 Other malaise: Secondary | ICD-10-CM

## 2023-12-07 DIAGNOSIS — R399 Unspecified symptoms and signs involving the genitourinary system: Secondary | ICD-10-CM

## 2023-12-07 LAB — URINALYSIS, ROUTINE W REFLEX MICROSCOPIC
Bilirubin, UA: NEGATIVE
Glucose, UA: NEGATIVE
Ketones, UA: NEGATIVE
Leukocytes,UA: NEGATIVE
Nitrite, UA: NEGATIVE
Protein,UA: NEGATIVE
Specific Gravity, UA: 1.02 (ref 1.005–1.030)
Urobilinogen, Ur: 0.2 mg/dL (ref 0.2–1.0)
pH, UA: 7 (ref 5.0–7.5)
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
## 2023-12-08 LAB — URINE CULTURE: Organism ID, Bacteria: NO GROWTH

## 2023-12-09 ENCOUNTER — Other Ambulatory Visit: Payer: Self-pay | Admitting: *Deleted

## 2023-12-09 DIAGNOSIS — I10 Essential (primary) hypertension: Secondary | ICD-10-CM

## 2023-12-09 DIAGNOSIS — K222 Esophageal obstruction: Secondary | ICD-10-CM

## 2023-12-09 DIAGNOSIS — K2100 Gastro-esophageal reflux disease with esophagitis, without bleeding: Secondary | ICD-10-CM

## 2023-12-10 ENCOUNTER — Encounter: Payer: Self-pay | Admitting: Family Medicine

## 2023-12-10 LAB — ALPHA-GAL PANEL
Allergen Lamb IgE: 0.85 kU/L — AB
Beef IgE: 1.63 kU/L — AB
IgE (Immunoglobulin E), Serum: 279 [IU]/mL (ref 6–495)
O215-IgE Alpha-Gal: 4.15 kU/L — AB
Pork IgE: 0.47 kU/L — AB

## 2023-12-10 LAB — CBC WITH DIFFERENTIAL/PLATELET
Basophils Absolute: 0 x10E3/uL (ref 0.0–0.2)
Basos: 1 %
EOS (ABSOLUTE): 0.4 x10E3/uL (ref 0.0–0.4)
Eos: 5 %
Hematocrit: 43 % (ref 37.5–51.0)
Hemoglobin: 13.8 g/dL (ref 13.0–17.7)
Immature Grans (Abs): 0 x10E3/uL (ref 0.0–0.1)
Immature Granulocytes: 0 %
Lymphocytes Absolute: 1.7 x10E3/uL (ref 0.7–3.1)
Lymphs: 24 %
MCH: 29.2 pg (ref 26.6–33.0)
MCHC: 32.1 g/dL (ref 31.5–35.7)
MCV: 91 fL (ref 79–97)
Monocytes Absolute: 0.5 x10E3/uL (ref 0.1–0.9)
Monocytes: 7 %
Neutrophils Absolute: 4.3 x10E3/uL (ref 1.4–7.0)
Neutrophils: 63 %
Platelets: 185 x10E3/uL (ref 150–450)
RBC: 4.72 x10E6/uL (ref 4.14–5.80)
RDW: 13.1 % (ref 11.6–15.4)
WBC: 6.9 x10E3/uL (ref 3.4–10.8)

## 2023-12-10 LAB — CMP14+EGFR
ALT: 19 IU/L (ref 0–44)
AST: 19 IU/L (ref 0–40)
Albumin: 4.2 g/dL (ref 3.8–4.8)
Alkaline Phosphatase: 61 IU/L (ref 47–123)
BUN/Creatinine Ratio: 31 — ABNORMAL HIGH (ref 10–24)
BUN: 22 mg/dL (ref 8–27)
Bilirubin Total: 0.4 mg/dL (ref 0.0–1.2)
CO2: 21 mmol/L (ref 20–29)
Calcium: 9.2 mg/dL (ref 8.6–10.2)
Chloride: 102 mmol/L (ref 96–106)
Creatinine, Ser: 0.72 mg/dL — ABNORMAL LOW (ref 0.76–1.27)
Globulin, Total: 2.3 g/dL (ref 1.5–4.5)
Glucose: 243 mg/dL — ABNORMAL HIGH (ref 70–99)
Potassium: 4.5 mmol/L (ref 3.5–5.2)
Sodium: 137 mmol/L (ref 134–144)
Total Protein: 6.5 g/dL (ref 6.0–8.5)
eGFR: 96 mL/min/1.73 (ref >59)

## 2023-12-10 LAB — LYME DISEASE SEROLOGY W/REFLEX

## 2023-12-10 LAB — SPOTTED FEVER GROUP ANTIBODIES
Spotted Fever Group IgG: <1:64 {titer}
Spotted Fever Group IgM: <1:64 {titer}

## 2023-12-10 LAB — TSH: TSH: 1.35 u[IU]/mL (ref 0.450–4.500)

## 2023-12-10 LAB — VITAMIN B12: Vitamin B-12: >2000 pg/mL — AB (ref 232–1245)

## 2023-12-10 NOTE — Telephone Encounter (Signed)
 Stacks pt NTBS 30-d given 11/12/23

## 2023-12-10 NOTE — Telephone Encounter (Signed)
Lmtcb letter mailed

## 2023-12-12 ENCOUNTER — Ambulatory Visit: Admitting: Family Medicine

## 2023-12-12 ENCOUNTER — Ambulatory Visit: Payer: Self-pay | Admitting: Family Medicine

## 2023-12-12 ENCOUNTER — Telehealth: Payer: Self-pay

## 2023-12-12 NOTE — Telephone Encounter (Signed)
 Tried calling patient to discuss lab results but no answer. Left message for patient to call back.

## 2023-12-12 NOTE — Telephone Encounter (Signed)
 Reviewed with patient. LS

## 2023-12-12 NOTE — Telephone Encounter (Signed)
 Copied from CRM #[id number]. Topic: Clinical - Lab/Test Results >> Dec 12, 2023  3:52 PM Geneva B wrote: Reason for CRM: patient missed call please call pt back   [phone] (M) [phone] (H) [phone] (W

## 2023-12-12 NOTE — Telephone Encounter (Signed)
 Copied from CRM [id number]. Topic: Clinical - Lab/Test Results >> Dec 12, 2023  4:10 PM Delon DASEN wrote: Reason for CRM: called about labs- read results verbatim, wants to speak with nurse- [phone]

## 2023-12-12 NOTE — Telephone Encounter (Signed)
 duplicate

## 2023-12-12 NOTE — Telephone Encounter (Signed)
 Copied from CRM [id number]. Topic: Clinical - Lab/Test Results >> Dec 12, 2023  4:00 PM Avram MATSU wrote: Reason for CRM: I relayed the test results to the pt, he had a few questions. Please advise [phone]

## 2023-12-19 ENCOUNTER — Telehealth: Payer: Self-pay

## 2023-12-19 NOTE — Telephone Encounter (Signed)
 Left detailed message making patient aware that urine result came back normal and showed no growth per Dr Rosana lab result notes.

## 2023-12-19 NOTE — Telephone Encounter (Signed)
 Copied from CRM [id number]. Topic: Clinical - Lab/Test Results >> Dec 19, 2023  9:18 AM Carlyon D wrote: Reason for CRM: Pt is calling stating some one called him today to discuss lab results, he did speak to some one yesterday. If called pt please give him a call back I called CAL and no answer

## 2024-01-26 ENCOUNTER — Other Ambulatory Visit: Payer: Self-pay | Admitting: Family Medicine

## 2024-01-28 ENCOUNTER — Ambulatory Visit: Admitting: Internal Medicine

## 2024-04-01 ENCOUNTER — Ambulatory Visit
